# Patient Record
Sex: Female | Born: 1970
Health system: Southern US, Community
[De-identification: ages and names within clinical notes are randomized; demographics above are authoritative.]

## PROBLEM LIST (undated history)

## (undated) DIAGNOSIS — G20 Parkinson's disease: Secondary | ICD-10-CM

## (undated) DIAGNOSIS — R87619 Unspecified abnormal cytological findings in specimens from cervix uteri: Secondary | ICD-10-CM

## (undated) HISTORY — DX: Unspecified abnormal cytological findings in specimens from cervix uteri: R87.619

## (undated) HISTORY — DX: Parkinson's disease: G20

## (undated) HISTORY — PX: COLPOSCOPY: SHX161

---
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
## 1990-09-19 HISTORY — PX: DILATION AND CURETTAGE OF UTERUS: SHX78

## 2002-09-19 HISTORY — PX: AUGMENTATION MAMMAPLASTY: SUR837

## 2013-11-28 ENCOUNTER — Encounter: Payer: Self-pay | Admitting: Certified Nurse Midwife

## 2014-03-27 ENCOUNTER — Encounter: Payer: Self-pay | Admitting: Certified Nurse Midwife

## 2014-03-27 ENCOUNTER — Ambulatory Visit (INDEPENDENT_AMBULATORY_CARE_PROVIDER_SITE_OTHER): Payer: Federal, State, Local not specified - PPO | Admitting: Certified Nurse Midwife

## 2014-03-27 VITALS — BP 136/84 | HR 68 | Ht 65.5 in | Wt 157.0 lb

## 2014-03-27 DIAGNOSIS — Z01419 Encounter for gynecological examination (general) (routine) without abnormal findings: Secondary | ICD-10-CM

## 2014-03-27 DIAGNOSIS — R6889 Other general symptoms and signs: Secondary | ICD-10-CM

## 2014-03-27 DIAGNOSIS — Z0000 Encounter for general adult medical examination without abnormal findings: Secondary | ICD-10-CM

## 2014-03-27 DIAGNOSIS — Z124 Encounter for screening for malignant neoplasm of cervix: Secondary | ICD-10-CM

## 2014-03-27 LAB — HEMOGLOBIN, FINGERSTICK: HEMOGLOBIN, FINGERSTICK: 13.9 g/dL (ref 12.0–16.0)

## 2014-03-27 LAB — POCT URINALYSIS DIPSTICK
Bilirubin, UA: NEGATIVE
GLUCOSE UA: NEGATIVE
Ketones, UA: NEGATIVE
Leukocytes, UA: NEGATIVE
Nitrite, UA: NEGATIVE
Protein, UA: NEGATIVE
RBC UA: NEGATIVE
Urobilinogen, UA: NEGATIVE
pH, UA: 7

## 2014-03-27 NOTE — Progress Notes (Signed)
Patient ID: Sarah Welch, female   DOB: [birth year], 43 y.o.   MRN: [id number] 43 y.o. G62P1011 Married Caucasian Fe here to establish gyn care and  for annual exam. Periods changing over the past year with becoming lighter and missed occasional one. Patient complaining of hot flashes and night sweats that are really a problem and would like t have some help with them.  Patient has not had a pap smear in 20 years. History of abnormal pap with cryo, ? Etiology. No gyn exam since last pap smear. Patient sees urgent care if needed. 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 History of anxiety with Celexa use, working well. Physician who managed has retired, but has current refills. No other health issues today.  Patient's last menstrual period was 03/04/2014.          Sexually active: Yes.    The current method of family planning is none. Spouse vasectomy.    Exercising: No.  The patient does not participate in regular exercise at present. Smoker:  yes  Health Maintenance: Pap:  20 years ago MMG:  never TDaP:  UTD, 2009 Labs: HB:  13.9  Urine:  Negative  Self Breast Exam:  never   reports that she has been smoking Cigarettes.  She has been smoking about 0.50 packs per day. She has never used smokeless tobacco. She reports that she does not drink alcohol or use illicit drugs.  Past Medical History  Diagnosis Date  . Abnormal Pap smear of cervix 1990     unsure abnormality, colpo OK    Past Surgical History  Procedure Laterality Date  . Augmentation mammaplasty Bilateral 2004    implants, saline  . Dilation and curettage of uterus  1992    following SAB    Current Outpatient Prescriptions  Medication Sig Dispense Refill  . citalopram (CELEXA) 20 MG tablet Take 1 tablet by mouth daily.      . furosemide (LASIX) 40 MG tablet Take 40 mg by mouth daily as needed.       No current facility-administered medications for this visit.    Family History  Problem Relation Age of Onset  . Heart  disease Mother   . Diabetes Maternal Grandmother   . Hypertension Maternal Grandmother   . Diabetes Maternal Grandfather   . Hypertension Maternal Grandfather   . Stroke Paternal Grandmother   . Heart attack Paternal Grandfather     ROS:  Pertinent items are noted in HPI.  Otherwise, a comprehensive ROS was negative.  Exam:   BP 136/84  Pulse 68  Ht 5' 5.5" (1.664 m)  Wt 157 lb (71.215 kg)  BMI 25.72 kg/m2  LMP 03/04/2014 Height: 5' 5.5" (166.4 cm)  Ht Readings from Last 3 Encounters:  03/27/14 5' 5.5" (1.664 m)    General appearance: alert, cooperative and appears stated age Head: Normocephalic, without obvious abnormality, atraumatic Neck: no adenopathy, supple, symmetrical, trachea midline and thyroid normal to inspection and palpation and non-palpable Lungs: clear to auscultation bilaterally Breasts: normal appearance, no masses or tenderness, No nipple retraction or dimpling, No nipple discharge or bleeding, No axillary or supraclavicular adenopathy Heart: regular rate and rhythm Abdomen: soft, non-tender; no masses,  no organomegaly Extremities: extremities normal, atraumatic, no cyanosis or edema Skin: Skin color, texture, turgor normal. No rashes or lesions Lymph nodes: Cervical, supraclavicular, and axillary nodes normal. No abnormal inguinal nodes palpated Neurologic: Grossly normal   Pelvic: External genitalia:  no lesions  Urethra:  normal appearing urethra with no masses, tenderness or lesions              Bartholin's and Skene's: normal                 Vagina: normal appearing vagina with normal color and discharge, no lesions              Cervix: normal, non tender              Pap taken: Yes.   Bimanual Exam:  Uterus:  normal size, contour, position, consistency, mobility, non-tender and anteverted              Adnexa: normal adnexa and no mass, fullness, tenderness               Rectovaginal: Confirms               Anus:  normal sphincter  tone, no lesions  A:  Well Woman with normal exam  Perimenopausal with cycle change  History of abnormal pap with cryo 1995  P:   Reviewed health and wellness pertinent to exam  Discussed perimenopausal etiology, expectations, bleeding profile changes. Patient keeping menses record and will continue and advise if no period in 3 months.  Lab:TSH,FSH, Vit D  Discussed importance of aex and pap smear evaluation.  Discussed will look at options to manage hot flashes after lab in. Patient will need to mammogram before hormonal therapy will be discussed. Patient given information to schedule. Questions addressed at length.  Pap smear taken today with HPVHR   counseled on breast self exam, mammography screening, adequate intake of calcium and vitamin D, diet and exercise  return annually or prn  An After Visit Summary was printed and given to the patient.

## 2014-03-27 NOTE — Patient Instructions (Signed)

## 2014-03-28 LAB — TSH: TSH: 1.181 u[IU]/mL (ref 0.350–4.500)

## 2014-03-28 LAB — FOLLICLE STIMULATING HORMONE: FSH: 3.3 m[IU]/mL

## 2014-03-28 LAB — VITAMIN D 25 HYDROXY (VIT D DEFICIENCY, FRACTURES): Vit D, 25-Hydroxy: 32 ng/mL (ref 30–89)

## 2014-03-28 NOTE — Progress Notes (Signed)
Reviewed personally.  M. Suzanne Carol Loftin, MD.  

## 2014-03-31 ENCOUNTER — Telehealth: Payer: Self-pay

## 2014-03-31 LAB — HEMOGLOBIN A1C
HEMOGLOBIN A1C: 5.1 % (ref <5.7)
Mean Plasma Glucose: 100 mg/dL (ref <117)

## 2014-03-31 LAB — IPS PAP TEST WITH HPV

## 2014-03-31 NOTE — Telephone Encounter (Signed)
Message copied by Susy Manor on Mon Mar 31, 2014  4:23 PM ------      Message from: Regina Eck      Created: Mon Mar 31, 2014  8:23 AM       Reviewed pap smear negative, HPVHR not detected 02 ------

## 2014-03-31 NOTE — Telephone Encounter (Signed)
lmtcb

## 2014-03-31 NOTE — Addendum Note (Signed)
Addended by: Regina Eck on: 03/31/2014 08:32 AM   Modules accepted: Orders

## 2014-03-31 NOTE — Addendum Note (Signed)
Addended by: Regina Eck on: 03/31/2014 08:29 AM   Modules accepted: Orders

## 2014-04-01 NOTE — Telephone Encounter (Signed)
Patient notified of results. See lab 

## 2014-04-03 ENCOUNTER — Telehealth: Payer: Self-pay

## 2014-04-03 NOTE — Telephone Encounter (Signed)
lmtcb

## 2014-04-03 NOTE — Telephone Encounter (Signed)
Message copied by Susy Manor on Thu Apr 03, 2014  2:15 PM ------      Message from: Regina Eck      Created: Thu Apr 03, 2014  8:03 AM       Hgb A 1-C normal, please notify patient and have make appointment in 2 weeks for OV to discuss status and hot flash management ------

## 2014-04-07 NOTE — Telephone Encounter (Signed)
Left message for call back.

## 2014-04-09 NOTE — Telephone Encounter (Signed)
Patient notified of results. See lab 

## 2014-04-22 ENCOUNTER — Ambulatory Visit: Payer: Federal, State, Local not specified - PPO | Admitting: Certified Nurse Midwife

## 2014-04-23 ENCOUNTER — Ambulatory Visit: Payer: Federal, State, Local not specified - PPO | Admitting: Certified Nurse Midwife

## 2014-04-24 ENCOUNTER — Ambulatory Visit (INDEPENDENT_AMBULATORY_CARE_PROVIDER_SITE_OTHER): Payer: Federal, State, Local not specified - PPO | Admitting: Certified Nurse Midwife

## 2014-04-24 ENCOUNTER — Encounter: Payer: Self-pay | Admitting: Certified Nurse Midwife

## 2014-04-24 VITALS — BP 128/78 | HR 72 | Ht 65.5 in | Wt 159.0 lb

## 2014-04-24 DIAGNOSIS — N938 Other specified abnormal uterine and vaginal bleeding: Secondary | ICD-10-CM

## 2014-04-24 DIAGNOSIS — N949 Unspecified condition associated with female genital organs and menstrual cycle: Secondary | ICD-10-CM

## 2014-04-24 DIAGNOSIS — N951 Menopausal and female climacteric states: Secondary | ICD-10-CM

## 2014-04-24 NOTE — Progress Notes (Signed)
43 y.o. Married Caucasian G2P1011here for review of labs and discussion of management of periodic hot flashes. Recent new gyn exam on 03/27/14, no exam or pap smear in 20 years. Patient continues to have hot flashes on and off with occasional sleep issues with. Patient now on 2 weeks of bleeding with period, this occurred in 10/14 and 1/15. Patient having no cramping and describes bleeding like a period getting lighter each day, but moderate/light for first 10 days and then a few heavy days. Not soaking more than a pad an hour. "I feel fine" no concerns of dizziness or faintness. Hgb on visit 13.9  O: Healthy female, WD WN Affect: normal orientation X 3 Skin: warm and dry Abdomen: non tender Pelvic exam: External exam: normal no lesions BUS negative Vagina: small amount of dark blood noted in vagina Cervix: small amount of dark blood noted from cervix, non tender, no lesions Uterus: mid position, slightly firm, non tender Adnexa: no masses palpated, non tender, normal    A: DUB with history of irregular menses with prolonged cycle currently ? Perimenopausal changes with hot flashes Normal lab profile on recent aex, FSH not menopausal, pap smear normal(history of cryo for abnormal 20 years ago) Here for discussion of cycle management and hot flashes and lab review. Normal pelvic exam  P: Discussed lab results and no indication of other reasons for hot flashes. Reviewed pap smear results. Discussed normal pelvic exam today . Discussed that fibroids or polyps can also contribute to heavy cycles and feel PUS and possible endometrial biopsy needed to further evaluation. Patient agreeable to what ever needed. Also discussed cycle management with POP which may control irregular cycles and lessen prolonged cycles when irregular. Discussed adding progesterone, may also help with hot flashes. Patient is a smoker so OCP contraindicated. Warning signs of excessive bleeding given. Instructed to start 800 mg  Motrin every 8 hours for next 48 hours which may encourage bleeding to decrease and stop.Discussed insurance personnel will advise precert information and then patient will be scheduled. Rv as above

## 2014-04-28 ENCOUNTER — Other Ambulatory Visit: Payer: Self-pay | Admitting: Certified Nurse Midwife

## 2014-04-28 ENCOUNTER — Telehealth: Payer: Self-pay

## 2014-04-28 ENCOUNTER — Other Ambulatory Visit: Payer: Self-pay | Admitting: Obstetrics & Gynecology

## 2014-04-28 DIAGNOSIS — N951 Menopausal and female climacteric states: Secondary | ICD-10-CM

## 2014-04-28 DIAGNOSIS — N921 Excessive and frequent menstruation with irregular cycle: Secondary | ICD-10-CM

## 2014-04-28 NOTE — Progress Notes (Signed)
I would repeat [place] on day 3 of cycle.  This will give better estimation of ovarian reserve/perimenopausal status.  I removed your orders and placed new orders for [facility] and endometrial biopsy.  Reviewed personally.  Felipa Emory, MD.

## 2014-04-28 NOTE — Telephone Encounter (Signed)
   Patient has already been notified and had consult regarding hot flashes. She needs [place] drawn on day 3 of cycle please schedule, order in, and she will be called regarding scheduling of SHGM and Endo biopsy.     Pt notified of this today. Pt states she will call when her cycle starts so she can have labs drawn on day 3 of her cycle

## 2014-04-30 ENCOUNTER — Telehealth: Payer: Self-pay | Admitting: Obstetrics & Gynecology

## 2014-04-30 NOTE — Telephone Encounter (Signed)
Left message for patient to call back.  SHGM only $230 EMB only $150

## 2014-04-30 NOTE — Telephone Encounter (Signed)
Message copied by Laqueta Due on Wed Apr 30, 2014 10:06 AM ------      Message from: Megan Salon      Created: Mon Apr 28, 2014 11:17 AM      Regarding: RE: orders on pt       I think she needs the [facility] but maybe not the biopsy.  Can you tell her total out of pocket but put a note not to collect anything until procedure is over and I know what I've done.  She should be scheduled for [facility], though.  Thanks.            MSM      ----- Message -----         From: Laqueta Due         Sent: 04/28/2014   9:26 AM           To: Lyman Speller, MD      Subject: RE: orders on pt                                         Good morning!            I completed pre-cert for this patient. If she has SGHM/EMB her out of pocket expense will be $380//if she has PUS only, her out of pocket expense will be $40.             Thank you,      Gabriel Cirri      ----- Message -----         From: Lyman Speller, MD         Sent: 04/28/2014   8:09 AM           To: Laqueta Due      Subject: orders on pt                                             I put in order for shgm and endo biopsy.  Endometrial biopsy really is just a possibility, not for sure.  Also, if this is very expensive I could start with just a PUS.  Please let me know.            MSM             ------

## 2014-04-30 NOTE — Telephone Encounter (Signed)
Spoke with patient. Advised of recommendation for SHGM/EMB. Advised that oop cost for both procedures would be $380. Also advised that Dr Sabra Heck strongly suggests that she proceed with [facility] and that the oop cost for that would be $230. Patient states that she would like to take a moment to review her finances and that she would call back to schedule.

## 2014-05-05 NOTE — Telephone Encounter (Signed)
Spoke with patient regarding scheduling for Sonohysterogram and possible endometrial bx.  She states last cycle last approximately 3-4 weeks.  Patient is sexually active, husband has vasectomy.  How should patient be scheduled for Sonohysterogram with irregular cycle?

## 2014-05-06 NOTE — Telephone Encounter (Signed)
Anytime as it will be hard to schedule right after a cycle.  Also, husband with vasectomy so no pregnancy risk.  Thanks.

## 2014-05-06 NOTE — Telephone Encounter (Signed)
Spoke with patient and message from Dr. Sabra Heck given.  Patient agreeable.  Advised will collect out of pocket after she is seen by Dr. Sabra Heck as we will wait that day until procedures are done.  U/S scheduled and patient aware/agreeable to time.  Patient verbalized understanding of the U/S appointment cancellation policy. Advised will need to cancel within 72 business hours (3 business days) or will have $150.00 no show fee placed to account. $150.00 for [facility] Requested to please check in 15 minutes early for registration and no minor children to appointment. Patient is agreeable and verbalizes understanding.  Ultrasound instructions mailed to patient.   Routing to provider for final review. Patient agreeable to disposition. Will close encounter

## 2014-05-08 ENCOUNTER — Other Ambulatory Visit (INDEPENDENT_AMBULATORY_CARE_PROVIDER_SITE_OTHER): Payer: Federal, State, Local not specified - PPO

## 2014-05-08 DIAGNOSIS — R6889 Other general symptoms and signs: Secondary | ICD-10-CM

## 2014-05-08 DIAGNOSIS — N951 Menopausal and female climacteric states: Secondary | ICD-10-CM

## 2014-05-09 LAB — FOLLICLE STIMULATING HORMONE: FSH: 15 m[IU]/mL

## 2014-05-29 ENCOUNTER — Ambulatory Visit (INDEPENDENT_AMBULATORY_CARE_PROVIDER_SITE_OTHER): Payer: Federal, State, Local not specified - PPO | Admitting: Obstetrics & Gynecology

## 2014-05-29 ENCOUNTER — Ambulatory Visit (INDEPENDENT_AMBULATORY_CARE_PROVIDER_SITE_OTHER): Payer: Federal, State, Local not specified - PPO

## 2014-05-29 ENCOUNTER — Other Ambulatory Visit: Payer: Self-pay | Admitting: Obstetrics & Gynecology

## 2014-05-29 VITALS — BP 128/72 | Ht 65.5 in | Wt 159.0 lb

## 2014-05-29 DIAGNOSIS — N938 Other specified abnormal uterine and vaginal bleeding: Secondary | ICD-10-CM

## 2014-05-29 DIAGNOSIS — N925 Other specified irregular menstruation: Secondary | ICD-10-CM

## 2014-05-29 DIAGNOSIS — N949 Unspecified condition associated with female genital organs and menstrual cycle: Secondary | ICD-10-CM

## 2014-05-29 DIAGNOSIS — N921 Excessive and frequent menstruation with irregular cycle: Secondary | ICD-10-CM

## 2014-05-29 DIAGNOSIS — N920 Excessive and frequent menstruation with regular cycle: Secondary | ICD-10-CM

## 2014-05-29 MED ORDER — NORETHINDRONE 0.35 MG PO TABS: 1.0000 | ORAL_TABLET | Freq: Every day | ORAL | Status: DC

## 2014-05-29 NOTE — Progress Notes (Signed)
43 y.o.Marriedfemale here for a pelvic ultrasound with sonohystogram due to DUB.  Pt initially seen by D. Hollice Espy 03/27/14 as new patient.  Major issue at that time was hot flashes.  FSH was 3.3.  Menstrual calendar kept and pt was seen again in follow up.  Cycles never heavy but could last up to 14 days.  Pt continued with hot flashes.  Day 3 FSH was obtained and was 15.  D/W pt these results today.    Contraception: vasectomy  Technique:  Both transabdominal and transvaginal ultrasound examinations of the pelvis were performed. Transabdominal technique was performed for global imaging of the pelvis including uterus, ovaries, adnexal regions, and pelvic cul-de-sac.  It was necessary to proceed with endovaginal exam following the abdominal ultrasound transabdominal exam to visualize the endometrium and adnexa.  Color and duplex Doppler ultrasound was utilized to evaluate blood flow to the ovaries.   FINDINGS: Uterus: 8.5 x 6.4 x 5.0cm, anteverted with normal shape Endometrium: 66mm Adnexa:  Left: 2.2 x 1.3 x 0.8cm     Right: 2.8 x 1.6 x 1.2cm with 94mm and 53GU follicles Cul de sac: no free fluid  SHSG:  After obtaining appropriate verbal consent from patient, the cervix was visualized using a speculum, and prepped with betadine.  A tenaculum  was applied to the cervix.  Dilation of the cervix was not necessary. The catheter was passed into the uterus and sterile saline introduced, with the following findings: no intracavitary lesions noted.  Endometrial biopsy recommended. Verbal and written consent obtained.  Speculum placed.  Cervix visualized and cleansed with betadine prep.  A single toothed tenaculum was applied to the anterior lip of the cervix.  Endometrial pipelle was advanced through the cervix into the endometrial cavity without difficulty.  Pipelle passed to 7.5cm.  Suction applied and pipelle removed with good tissue sample obtained.  Tenculum removed.  No bleeding noted.  Patient tolerated  procedure well.  All instruments removed.  Assessment: Perimenopausal DUB Smoker Day 3 FSH 15  Plan:  Endometrial biopsy pending.  If normal, may consider micronor or low dose HRT with cyclic progesterone to improve bleeding profile.  ~25 minutes spent with patient >50% of time was in face to face discussion of above.

## 2014-06-08 ENCOUNTER — Encounter: Payer: Self-pay | Admitting: Obstetrics & Gynecology

## 2014-06-08 DIAGNOSIS — N938 Other specified abnormal uterine and vaginal bleeding: Secondary | ICD-10-CM | POA: Insufficient documentation

## 2014-06-11 ENCOUNTER — Telehealth: Payer: Self-pay | Admitting: Emergency Medicine

## 2014-06-11 NOTE — Telephone Encounter (Signed)
Message copied by Michele Mcalpine on Wed Jun 11, 2014 10:03 AM ------      Message from: Megan Salon      Created: Sun Jun 08, 2014 11:53 PM       Inform biopsy is negative for abnormal cells.  Done for DUB in 43 yo woman with mildly elevated FSH--perimenopausal.  I think she has a couple of options right now--start low dose HRT with cyclic progesterone to help with hot flashes and to, hopefully, get cycles more regular OR start micronor for cycle regulation.  It will help some with hot flashes but may not be enough.  Will have to try. ------

## 2014-06-11 NOTE — Telephone Encounter (Signed)
Advised patient of results from Dr. Sabra Heck.  Patient has been on Micronor x 2.5 weeks. She is still having some hot flashes. Patient discussed with patient her choices, patient thinks she will try with progesterone only pills for a few more weeks and see how she feels and will return call if she would like to try hormone replacement therapy.   Routing to provider for final review. Patient agreeable to disposition. Will close encounter

## 2014-07-01 DIAGNOSIS — R251 Tremor, unspecified: Secondary | ICD-10-CM | POA: Insufficient documentation

## 2014-07-01 DIAGNOSIS — R259 Unspecified abnormal involuntary movements: Secondary | ICD-10-CM | POA: Insufficient documentation

## 2014-07-01 DIAGNOSIS — G8190 Hemiplegia, unspecified affecting unspecified side: Secondary | ICD-10-CM | POA: Insufficient documentation

## 2014-07-02 ENCOUNTER — Telehealth: Payer: Self-pay | Admitting: Obstetrics & Gynecology

## 2014-07-02 NOTE — Telephone Encounter (Signed)
Pt had lab appt scheduled for 10/16 but if not going to be able to make it so she canceled and said she would call back to reschedule. i dont see orders for the labs.

## 2014-07-02 NOTE — Telephone Encounter (Signed)
That is fine.  Thanks.  Encounter closed.

## 2014-07-02 NOTE — Telephone Encounter (Signed)
Patient had 3 month recheck for Vitamin D level scheduled for 10/16. Currently on Vitamin D3 1000 units daily. Patient cancelled and states that she will call to reschedule. Patient also has a 3 month recheck on 12/10 with Dr.Miller from ultrasound visit on 9/10 for DUB.   Routing to [place] as Juluis Rainier

## 2014-07-04 ENCOUNTER — Other Ambulatory Visit: Payer: Federal, State, Local not specified - PPO

## 2014-07-21 ENCOUNTER — Encounter: Payer: Self-pay | Admitting: Obstetrics & Gynecology

## 2014-07-29 DIAGNOSIS — M5412 Radiculopathy, cervical region: Secondary | ICD-10-CM | POA: Insufficient documentation

## 2014-07-29 DIAGNOSIS — R252 Cramp and spasm: Secondary | ICD-10-CM | POA: Insufficient documentation

## 2014-08-19 DIAGNOSIS — G20 Parkinson's disease: Secondary | ICD-10-CM

## 2014-08-19 DIAGNOSIS — G20A1 Parkinson's disease without dyskinesia, without mention of fluctuations: Secondary | ICD-10-CM | POA: Insufficient documentation

## 2014-08-19 HISTORY — DX: Parkinson's disease without dyskinesia, without mention of fluctuations: G20.A1

## 2014-08-19 HISTORY — DX: Parkinson's disease: G20

## 2014-08-28 ENCOUNTER — Ambulatory Visit (INDEPENDENT_AMBULATORY_CARE_PROVIDER_SITE_OTHER): Payer: Federal, State, Local not specified - PPO | Admitting: Obstetrics & Gynecology

## 2014-08-28 ENCOUNTER — Encounter: Payer: Self-pay | Admitting: Obstetrics & Gynecology

## 2014-08-28 VITALS — BP 126/70 | HR 68 | Resp 16 | Wt 164.0 lb

## 2014-08-28 DIAGNOSIS — E559 Vitamin D deficiency, unspecified: Secondary | ICD-10-CM

## 2014-08-28 DIAGNOSIS — N938 Other specified abnormal uterine and vaginal bleeding: Secondary | ICD-10-CM

## 2014-08-28 MED ORDER — NORETHINDRONE 0.35 MG PO TABS: 1.0000 | ORAL_TABLET | Freq: Every day | ORAL | Status: DC

## 2014-08-28 NOTE — Progress Notes (Signed)
43 y.o. Married Caucasian female G2P1011 here for follow up of DUB after starting Micronor.  Pt had normal ultrasound and with endometrial biopsy 9/15.  Hot flashes have improved and she has had no bleeding since beginning the micronor.  Really very please with how she is doing.  Expected cycles to get better but not to stop entirely.  Advised this is a side effect with micronor so can switch if this bother her.  It does not so will not make a change.     Does need Vit D recheck today.  Was hoping could do at same time.    Big event since last visit is she was diagnosed with Parkinson's.  Seeing local neurologist but does have referral to neurologist at [facility] that is upcoming.  Reports she had suspected it for a while as she was having trouble doing anything with one hand due to tremor.  That stopped within 24 hours of starting Levodopa.  Wasn't really that upset with diagnosis as there could have been many other worse findings.  Happy to be able to continue working full time.  O: Healthy WD,WN female Affect: normal No other exam today  A:  DUB, improved with micronor Vit D deficiency New diagnosis of Parkinson's  Plan:  Continue Micronor.  Rx to pharmacy. Vit D today. Return for AEX 2016.

## 2014-08-29 LAB — VITAMIN D 25 HYDROXY (VIT D DEFICIENCY, FRACTURES): VIT D 25 HYDROXY: 21 ng/mL — AB (ref 30–100)

## 2014-09-09 ENCOUNTER — Telehealth: Payer: Self-pay

## 2014-09-09 NOTE — Telephone Encounter (Signed)
Lmtcb//kn 

## 2014-09-09 NOTE — Telephone Encounter (Signed)
Patient is returning a call to Kelly °

## 2014-09-09 NOTE — Telephone Encounter (Signed)
-----   Message from Lyman Speller, MD sent at 09/08/2014  5:01 PM EST ----- Please inform Vit d is still low.  Is she taking the supplement?  If so, needs 50,000 IU weekly and repeat VIt D in 3 months.  If not, needs to start and can check again at AEX.  Thanks.

## 2014-09-09 NOTE — Telephone Encounter (Signed)
lmtcb to discuss Vitamin D results.//kn

## 2014-09-10 NOTE — Telephone Encounter (Signed)
Pt informed of results. Pt states she has not been taking any supplements and stated she will start them. Pt understands we will recheck at her AEX  Encounter closed

## 2014-09-25 ENCOUNTER — Ambulatory Visit (INDEPENDENT_AMBULATORY_CARE_PROVIDER_SITE_OTHER): Payer: Federal, State, Local not specified - PPO | Admitting: Nurse Practitioner

## 2014-09-25 ENCOUNTER — Encounter: Payer: Self-pay | Admitting: Nurse Practitioner

## 2014-09-25 VITALS — BP 110/70 | HR 72 | Resp 16 | Ht 65.5 in | Wt 161.0 lb

## 2014-09-25 DIAGNOSIS — N390 Urinary tract infection, site not specified: Secondary | ICD-10-CM

## 2014-09-25 DIAGNOSIS — N760 Acute vaginitis: Secondary | ICD-10-CM

## 2014-09-25 DIAGNOSIS — R319 Hematuria, unspecified: Secondary | ICD-10-CM

## 2014-09-25 LAB — POCT URINALYSIS DIPSTICK
Bilirubin, UA: NEGATIVE
Blood, UA: 50
GLUCOSE UA: NEGATIVE
Ketones, UA: NEGATIVE
Nitrite, UA: NEGATIVE
Protein, UA: NEGATIVE
UROBILINOGEN UA: NEGATIVE
pH, UA: 5

## 2014-09-25 NOTE — Progress Notes (Signed)
44 y.o.  MW Fe [id number] here with complaint of vaginal symptoms of itching, burning, and increase discharge. Describes discharge as clear. Onset of symptoms 5 days ago. Denies new personal products or vaginal dryness. No STD concerns. Urinary symptoms 12/27 with urgency and frequency treated with increase in fluids and symptoms improved. No fever or chills.  On continuous active pills to control AUB.   O:Healthy female WDWN Affect: normal, orientation x 3  Exam: Abdomen: Lymph node: no enlargement or tenderness Pelvic exam: External genital:  BUS: negative Vagina: thin light brown tinged discharge noted. Cervix: normal, non tender Uterus: normal, non tender Adnexa:normal, non tender, no masses or fullness noted  Affirm test is done   A: R/O BV  continuous active OCP for AUB with BTB  R/O UTI   P: Discussed findings of vaginitis and etiology.  She may have vaginal PH changes due to onset of menses . Discussed Aveeno or baking soda sitz bath for comfort. Avoid moist clothes or pads for extended period of time. If working out in gym clothes or swim suits for long periods of time change underwear or bottoms of swimsuit if possible. Olive Oil use for skin protection prior to activity can be used to external skin.  Rx: none at present awaiting labs - Affirm  Since not symptomatic with UTI - also await treatment for that.  RV prn

## 2014-09-25 NOTE — Patient Instructions (Signed)
Bacterial Vaginosis Bacterial vaginosis is a vaginal infection that occurs when the normal balance of bacteria in the vagina is disrupted. It results from an overgrowth of certain bacteria. This is the most common vaginal infection in women of childbearing age. Treatment is important to prevent complications, especially in pregnant women, as it can cause a premature delivery. CAUSES  Bacterial vaginosis is caused by an increase in harmful bacteria that are normally present in smaller amounts in the vagina. Several different kinds of bacteria can cause bacterial vaginosis. However, the reason that the condition develops is not fully understood. RISK FACTORS Certain activities or behaviors can put you at an increased risk of developing bacterial vaginosis, including:  Having a new sex partner or multiple sex partners.  Douching.  Using an intrauterine device (IUD) for contraception. Women do not get bacterial vaginosis from toilet seats, bedding, swimming pools, or contact with objects around them. SIGNS AND SYMPTOMS  Some women with bacterial vaginosis have no signs or symptoms. Common symptoms include:  Grey vaginal discharge.  A fishlike odor with discharge, especially after sexual intercourse.  Itching or burning of the vagina and vulva.  Burning or pain with urination. DIAGNOSIS  Your health care provider will take a medical history and examine the vagina for signs of bacterial vaginosis. A sample of vaginal fluid may be taken. Your health care provider will look at this sample under a microscope to check for bacteria and abnormal cells. A vaginal pH test may also be done.  TREATMENT  Bacterial vaginosis may be treated with antibiotic medicines. These may be given in the form of a pill or a vaginal cream. A second round of antibiotics may be prescribed if the condition comes back after treatment.  HOME CARE INSTRUCTIONS   Only take over-the-counter or prescription medicines as  directed by your health care provider.  If antibiotic medicine was prescribed, take it as directed. Make sure you finish it even if you start to feel better.  Do not have sex until treatment is completed.  Tell all sexual partners that you have a vaginal infection. They should see their health care provider and be treated if they have problems, such as a mild rash or itching.  Practice safe sex by using condoms and only having one sex partner. SEEK MEDICAL CARE IF:   Your symptoms are not improving after 3 days of treatment.  You have increased discharge or pain.  You have a fever. MAKE SURE YOU:   Understand these instructions.  Will watch your condition.  Will get help right away if you are not doing well or get worse. FOR MORE INFORMATION  Centers for Disease Control and Prevention, Division of STD Prevention: www.cdc.gov/std American Sexual Health Association (ASHA): www.ashastd.org  Document Released: 09/05/2005 Document Revised: 06/26/2013 Document Reviewed: 04/17/2013 ExitCare Patient Information 2015 [employer]. This information is not intended to replace advice given to you by your health care provider. Make sure you discuss any questions you have with your health care provider.  

## 2014-09-26 LAB — URINALYSIS, MICROSCOPIC ONLY
Casts: NONE SEEN
Crystals: NONE SEEN

## 2014-09-26 LAB — WET PREP BY MOLECULAR PROBE
Candida species: NEGATIVE
GARDNERELLA VAGINALIS: NEGATIVE
Trichomonas vaginosis: NEGATIVE

## 2014-09-27 LAB — URINE CULTURE: Colony Count: 3000

## 2014-09-28 NOTE — Progress Notes (Signed)
Encounter reviewed by Dr. Ferrel Simington Silva.  

## 2014-12-10 DIAGNOSIS — F419 Anxiety disorder, unspecified: Secondary | ICD-10-CM | POA: Insufficient documentation

## 2014-12-23 DIAGNOSIS — G2581 Restless legs syndrome: Secondary | ICD-10-CM | POA: Insufficient documentation

## 2015-04-02 ENCOUNTER — Encounter: Payer: Self-pay | Admitting: Certified Nurse Midwife

## 2015-04-02 ENCOUNTER — Ambulatory Visit (INDEPENDENT_AMBULATORY_CARE_PROVIDER_SITE_OTHER): Payer: Federal, State, Local not specified - PPO | Admitting: Certified Nurse Midwife

## 2015-04-02 VITALS — BP 110/74 | HR 70 | Resp 16 | Ht 64.75 in | Wt 159.0 lb

## 2015-04-02 DIAGNOSIS — Z01419 Encounter for gynecological examination (general) (routine) without abnormal findings: Secondary | ICD-10-CM

## 2015-04-02 DIAGNOSIS — Z124 Encounter for screening for malignant neoplasm of cervix: Secondary | ICD-10-CM | POA: Diagnosis not present

## 2015-04-02 DIAGNOSIS — Z0000 Encounter for general adult medical examination without abnormal findings: Secondary | ICD-10-CM

## 2015-04-02 LAB — LIPID PANEL
CHOL/HDL RATIO: 2.5 ratio
CHOLESTEROL: 134 mg/dL (ref 0–200)
HDL: 54 mg/dL (ref >=46)
LDL Cholesterol: 69 mg/dL (ref 0–99)
Triglycerides: 53 mg/dL (ref <150)
VLDL: 11 mg/dL (ref 0–40)

## 2015-04-02 LAB — CBC
HCT: 40.6 % (ref 36.0–46.0)
Hemoglobin: 13 g/dL (ref 12.0–15.0)
MCH: 27.8 pg (ref 26.0–34.0)
MCHC: 32 g/dL (ref 30.0–36.0)
MCV: 86.9 fL (ref 78.0–100.0)
MPV: 10.2 fL (ref 8.6–12.4)
PLATELETS: 219 10*3/uL (ref 150–400)
RBC: 4.67 MIL/uL (ref 3.87–5.11)
RDW: 14.6 % (ref 11.5–15.5)
WBC: 7.8 10*3/uL (ref 4.0–10.5)

## 2015-04-02 LAB — COMPREHENSIVE METABOLIC PANEL
ALK PHOS: 35 U/L — AB (ref 39–117)
ALT: 12 U/L (ref 0–35)
AST: 17 U/L (ref 0–37)
Albumin: 3.9 g/dL (ref 3.5–5.2)
BILIRUBIN TOTAL: 0.5 mg/dL (ref 0.2–1.2)
BUN: 11 mg/dL (ref 6–23)
CALCIUM: 9.2 mg/dL (ref 8.4–10.5)
CHLORIDE: 103 meq/L (ref 96–112)
CO2: 23 mEq/L (ref 19–32)
CREATININE: 0.77 mg/dL (ref 0.50–1.10)
Glucose, Bld: 91 mg/dL (ref 70–99)
Potassium: 4.8 mEq/L (ref 3.5–5.3)
SODIUM: 137 meq/L (ref 135–145)
Total Protein: 6.7 g/dL (ref 6.0–8.3)

## 2015-04-02 MED ORDER — NORETHINDRONE 0.35 MG PO TABS: 1.0000 | ORAL_TABLET | Freq: Every day | ORAL | Status: DC

## 2015-04-02 NOTE — Patient Instructions (Signed)
EXERCISE AND DIET:  We recommended that you start or continue a regular exercise program for good health. Regular exercise means any activity that makes your heart beat faster and makes you sweat.  We recommend exercising at least 30 minutes per day at least 3 days a week, preferably 4 or 5.  We also recommend a diet low in fat and sugar.  Inactivity, poor dietary choices and obesity can cause diabetes, heart attack, stroke, and kidney damage, among others.    ALCOHOL AND SMOKING:  Women should limit their alcohol intake to no more than 7 drinks/beers/glasses of wine (combined, not each!) per week. Moderation of alcohol intake to this level decreases your risk of breast cancer and liver damage. And of course, no recreational drugs are part of a healthy lifestyle.  And absolutely no smoking or even second hand smoke. Most people know smoking can cause heart and lung diseases, but did you know it also contributes to weakening of your bones? Aging of your skin?  Yellowing of your teeth and nails?  CALCIUM AND VITAMIN D:  Adequate intake of calcium and Vitamin D are recommended.  The recommendations for exact amounts of these supplements seem to change often, but generally speaking 600 mg of calcium (either carbonate or citrate) and 800 units of Vitamin D per day seems prudent. Certain women may benefit from higher intake of Vitamin D.  If you are among these women, your doctor will have told you during your visit.    PAP SMEARS:  Pap smears, to check for cervical cancer or precancers,  have traditionally been done yearly, although recent scientific advances have shown that most women can have pap smears less often.  However, every woman still should have a physical exam from her gynecologist every year. It will include a breast check, inspection of the vulva and vagina to check for abnormal growths or skin changes, a visual exam of the cervix, and then an exam to evaluate the size and shape of the uterus and  ovaries.  And after 45 years of age, a rectal exam is indicated to check for rectal cancers. We will also provide age appropriate advice regarding health maintenance, like when you should have certain vaccines, screening for sexually transmitted diseases, bone density testing, colonoscopy, mammograms, etc.   MAMMOGRAMS:  All women over 40 years old should have a yearly mammogram. Many facilities now offer a "3D" mammogram, which may cost around $50 extra out of pocket. If possible,  we recommend you accept the option to have the 3D mammogram performed.  It both reduces the number of women who will be called back for extra views which then turn out to be normal, and it is better than the routine mammogram at detecting truly abnormal areas.    COLONOSCOPY:  Colonoscopy to screen for colon cancer is recommended for all women at age 50.  We know, you hate the idea of the prep.  We agree, BUT, having colon cancer and not knowing it is worse!!  Colon cancer so often starts as a polyp that can be seen and removed at colonscopy, which can quite literally save your life!  And if your first colonoscopy is normal and you have no family history of colon cancer, most women don't have to have it again for 10 years.  Once every ten years, you can do something that may end up saving your life, right?  We will be happy to help you get it scheduled when you are ready.    Be sure to check your insurance coverage so you understand how much it will cost.  It may be covered as a preventative service at no cost, but you should check your particular policy.     Exercise to Lose Weight Exercise and a healthy diet may help you lose weight. Your doctor may suggest specific exercises. EXERCISE IDEAS AND TIPS  Choose low-cost things you enjoy doing, such as walking, bicycling, or exercising to workout videos.  Take stairs instead of the elevator.  Walk during your lunch break.  Park your car further away from work or  school.  Go to a gym or an exercise class.  Start with 5 to 10 minutes of exercise each day. Build up to 30 minutes of exercise 4 to 6 days a week.  Wear shoes with good support and comfortable clothes.  Stretch before and after working out.  Work out until you breathe harder and your heart beats faster.  Drink extra water when you exercise.  Do not do so much that you hurt yourself, feel dizzy, or get very short of breath. Exercises that burn about 150 calories:  Running 1  miles in 15 minutes.  Playing volleyball for 45 to 60 minutes.  Washing and waxing a car for 45 to 60 minutes.  Playing touch football for 45 minutes.  Walking 1  miles in 35 minutes.  Pushing a stroller 1  miles in 30 minutes.  Playing basketball for 30 minutes.  Raking leaves for 30 minutes.  Bicycling 5 miles in 30 minutes.  Walking 2 miles in 30 minutes.  Dancing for 30 minutes.  Shoveling snow for 15 minutes.  Swimming laps for 20 minutes.  Walking up stairs for 15 minutes.  Bicycling 4 miles in 15 minutes.  Gardening for 30 to 45 minutes.  Jumping rope for 15 minutes.  Washing windows or floors for 45 to 60 minutes. Document Released: 10/08/2010 Document Revised: 11/28/2011 Document Reviewed: 10/08/2010 [facility] Patient Information 2015 [place], [place]. This information is not intended to replace advice given to you by your health care provider. Make sure you discuss any questions you have with your health care provider.

## 2015-04-02 NOTE — Progress Notes (Signed)
44 y.o. G28P1011 Married  Caucasian Fe here for annual exam. Menses absent with Micronor, but now regular, not heavy. Happy with choice for contraception and cycle control. Sees PCP for Celexa management, stable dosage.  Sees Dr.Willis for other medication management for Parkinson Disease, stable at present. Desires screening labs today. Plans to work on [employer] management with better choices and exercise. No other health issues today.  Patient's last menstrual period was 03/02/2015.          Sexually active: Yes.    The current method of family planning is oral progesterone-only contraceptive.    Exercising: Yes.    walking Smoker:  no  Health Maintenance: Pap: 03-27-14 neg HPV HR neg MMG: 04-02-14 category b density, birads 2:neg she has scheduled Colonoscopy:  none BMD:   none TDaP:  2009 Labs: Hgb-13.2 Self breast exam: not done   reports that she has quit smoking. Her smoking use included Cigarettes. She smoked 0.50 packs per day. She has never used smokeless tobacco. She reports that she does not drink alcohol or use illicit drugs.  Past Medical History  Diagnosis Date  . Abnormal Pap smear of cervix 1990     unsure abnormality, colpo OK  . Parkinson disease 12/15    Past Surgical History  Procedure Laterality Date  . Augmentation mammaplasty Bilateral 2004    implants, saline  . Dilation and curettage of uterus  1992    following SAB  . Colposcopy      Current Outpatient Prescriptions  Medication Sig Dispense Refill  . carbidopa-levodopa (SINEMET IR) 25-100 MG per tablet Take by mouth.    . citalopram (CELEXA) 20 MG tablet Take 1 tablet by mouth daily.    . furosemide (LASIX) 40 MG tablet Take 40 mg by mouth daily as needed.    . gabapentin (NEURONTIN) 300 MG capsule Take 300 mg by mouth.    . norethindrone (MICRONOR,CAMILA,ERRIN) 0.35 MG tablet Take 1 tablet (0.35 mg total) by mouth daily. 1 Package 7   No current facility-administered medications for this visit.     Family History  Problem Relation Age of Onset  . Heart disease Mother   . Diabetes Maternal Grandmother   . Hypertension Maternal Grandmother   . Diabetes Maternal Grandfather   . Hypertension Maternal Grandfather   . Stroke Paternal Grandmother   . Heart attack Paternal Grandfather     ROS:  Pertinent items are noted in HPI.  Otherwise, a comprehensive ROS was negative.  Exam:   BP 110/74 mmHg  Pulse 70  Resp 16  Ht 5' 4.75" (1.645 m)  Wt 159 lb (72.122 kg)  BMI 26.65 kg/m2  LMP 03/02/2015 Height: 5' 4.75" (164.5 cm) Ht Readings from Last 3 Encounters:  04/02/15 5' 4.75" (1.645 m)  09/25/14 5' 5.5" (1.664 m)  05/29/14 5' 5.5" (1.664 m)    General appearance: alert, cooperative and appears stated age Head: Normocephalic, without obvious abnormality, atraumatic Neck: no adenopathy, supple, symmetrical, trachea midline and thyroid normal to inspection and palpation Lungs: clear to auscultation bilaterally Breasts: normal appearance, no masses or tenderness, No nipple retraction or dimpling, No nipple discharge or bleeding, No axillary or supraclavicular adenopathy Heart: regular rate and rhythm Abdomen: soft, non-tender; no masses,  no organomegaly Extremities: extremities normal, atraumatic, no cyanosis or edema Skin: Skin color, texture, turgor normal. No rashes or lesions Lymph nodes: Cervical, supraclavicular, and axillary nodes normal. No abnormal inguinal nodes palpated Neurologic: Grossly normal   Pelvic: External genitalia:  no lesions  Urethra:  normal appearing urethra with no masses, tenderness or lesions              Bartholin's and Skene's: normal                 Vagina: normal appearing vagina with normal color and discharge, no lesions              Cervix: normal,non tender,no lesions              Pap taken: Yes.   Bimanual Exam:  Uterus:  normal size, contour, position, consistency, mobility, non-tender              Adnexa: normal adnexa  and no mass, fullness, tenderness               Rectovaginal: Confirms               Anus:  normal sphincter tone, no lesions  Chaperone present: Yes  A:  Well Woman with normal exam  Contraception POP desires continuance  Parkinson Disease with Neurology management  Anxiety with MD management  Screening labs  History of abnormal pap with Cryo  P:   Reviewed health and wellness pertinent to exam  Rx Micronor see order  Continue MD management as indicated  Labs CMP,Lipid panel, Vitamin D, CBC  Pap smear taken with HPV reflex   counseled on breast self exam, mammography screening, adequate intake of calcium and vitamin D, diet and exercise  return annually or prn  An After Visit Summary was printed and given to the patient.

## 2015-04-03 LAB — HEMOGLOBIN, FINGERSTICK: HEMOGLOBIN, FINGERSTICK: 13.2 g/dL (ref 12.0–16.0)

## 2015-04-03 LAB — VITAMIN D 25 HYDROXY (VIT D DEFICIENCY, FRACTURES): Vit D, 25-Hydroxy: 34 ng/mL (ref 30–100)

## 2015-04-03 NOTE — Progress Notes (Signed)
Reviewed personally.  M. Suzanne Glada Wickstrom, MD.  

## 2015-04-04 LAB — IPS PAP TEST WITH REFLEX TO HPV

## 2015-10-02 DIAGNOSIS — J40 Bronchitis, not specified as acute or chronic: Secondary | ICD-10-CM | POA: Insufficient documentation

## 2015-10-02 DIAGNOSIS — J0110 Acute frontal sinusitis, unspecified: Secondary | ICD-10-CM | POA: Insufficient documentation

## 2016-04-07 DIAGNOSIS — Z9109 Other allergy status, other than to drugs and biological substances: Secondary | ICD-10-CM | POA: Insufficient documentation

## 2016-04-07 DIAGNOSIS — R51 Headache: Secondary | ICD-10-CM

## 2016-04-07 DIAGNOSIS — R519 Headache, unspecified: Secondary | ICD-10-CM | POA: Insufficient documentation

## 2016-04-15 ENCOUNTER — Ambulatory Visit (INDEPENDENT_AMBULATORY_CARE_PROVIDER_SITE_OTHER): Payer: Federal, State, Local not specified - PPO | Admitting: Certified Nurse Midwife

## 2016-04-15 ENCOUNTER — Encounter: Payer: Self-pay | Admitting: Certified Nurse Midwife

## 2016-04-15 VITALS — BP 120/78 | HR 72 | Resp 16 | Ht 64.75 in | Wt 159.0 lb

## 2016-04-15 DIAGNOSIS — Z0000 Encounter for general adult medical examination without abnormal findings: Secondary | ICD-10-CM

## 2016-04-15 DIAGNOSIS — Z01419 Encounter for gynecological examination (general) (routine) without abnormal findings: Secondary | ICD-10-CM | POA: Diagnosis not present

## 2016-04-15 DIAGNOSIS — Z3041 Encounter for surveillance of contraceptive pills: Secondary | ICD-10-CM | POA: Diagnosis not present

## 2016-04-15 DIAGNOSIS — K649 Unspecified hemorrhoids: Secondary | ICD-10-CM

## 2016-04-15 LAB — POCT URINALYSIS DIPSTICK
BILIRUBIN UA: NEGATIVE
GLUCOSE UA: NEGATIVE
KETONES UA: NEGATIVE
Leukocytes, UA: NEGATIVE
NITRITE UA: NEGATIVE
PH UA: 5
Protein, UA: NEGATIVE
RBC UA: NEGATIVE
Urobilinogen, UA: NEGATIVE

## 2016-04-15 MED ORDER — NORETHINDRONE 0.35 MG PO TABS: 1.0000 | ORAL_TABLET | Freq: Every day | ORAL | 4 refills | Status: DC

## 2016-04-15 NOTE — Progress Notes (Signed)
45 y.o. G4P1011 Married  Caucasian Fe here for annual exam. Periods are scant to none with Micronor use. Sees PCP for Tramadol(sleep) and allergy medication. Sees Neurology for Parkinson's disease with medication management. Stable at present. Still having tremors at times. Desires lab if needed. ? Hemorrhoid please check.  Denies constipation and blood in stool. No other health issues today.  LMP: 01-14-16        Sexually active: Yes.    The current method of family planning is oral progesterone-only contraceptive.    Exercising: No.  exercise Smoker:  no  Health Maintenance: Pap: 04-02-15 neg MMG: 04-24-15 category b density birads 1:neg Colonoscopy:  none BMD:   none TDaP:  2009 Shingles: no Pneumonia: no Hep C and HIV: HIV done negative. Labs: poct urine-neg Self breast exam: not done   reports that she has quit smoking. Her smoking use included Cigarettes. She smoked 0.50 packs per day. She has never used smokeless tobacco. She reports that she does not drink alcohol or use drugs.  Past Medical History:  Diagnosis Date  . Abnormal Pap smear of cervix 1990    unsure abnormality, colpo OK  . Parkinson disease 12/15    Past Surgical History:  Procedure Laterality Date  . AUGMENTATION MAMMAPLASTY Bilateral 2004   implants, saline  . COLPOSCOPY    . DILATION AND CURETTAGE OF UTERUS  1992   following SAB    Current Outpatient Prescriptions  Medication Sig Dispense Refill  . carbidopa-levodopa (SINEMET IR) 25-100 MG per tablet Take by mouth.    . citalopram (CELEXA) 20 MG tablet Take 1 tablet by mouth daily.    . furosemide (LASIX) 40 MG tablet Take 40 mg by mouth daily as needed.    . gabapentin (NEURONTIN) 300 MG capsule Take 300 mg by mouth.    . norethindrone (MICRONOR,CAMILA,ERRIN) 0.35 MG tablet Take 1 tablet (0.35 mg total) by mouth daily. 3 Package 4   No current facility-administered medications for this visit.     Family History  Problem Relation Age of Onset   . Heart disease Mother   . Diabetes Maternal Grandmother   . Hypertension Maternal Grandmother   . Diabetes Maternal Grandfather   . Hypertension Maternal Grandfather   . Stroke Paternal Grandmother   . Heart attack Paternal Grandfather     ROS:  Pertinent items are noted in HPI.  Otherwise, a comprehensive ROS was negative.  Exam:   Ht 5' 4.75" (1.645 m)   Wt 159 lb (72.1 kg)   BMI 26.66 kg/m  Height: 5' 4.75" (164.5 cm) Ht Readings from Last 3 Encounters:  04/15/16 5' 4.75" (1.645 m)  04/02/15 5' 4.75" (1.645 m)  09/25/14 5' 5.5" (1.664 m)    General appearance: alert, cooperative and appears stated age Head: Normocephalic, without obvious abnormality, atraumatic Neck: no adenopathy, supple, symmetrical, trachea midline and thyroid normal to inspection and palpation Lungs: clear to auscultation bilaterally Breasts: normal appearance, no masses or tenderness, No nipple retraction or dimpling, No nipple discharge or bleeding, No axillary or supraclavicular adenopathy, implant noted to appear intact Heart: regular rate and rhythm Abdomen: soft, non-tender; no masses,  no organomegaly Extremities: extremities normal, atraumatic, no cyanosis or edema Skin: Skin color, texture, turgor normal. No rashes or lesions Lymph nodes: Cervical, supraclavicular, and axillary nodes normal. No abnormal inguinal nodes palpated Neurologic: Grossly normal   Pelvic: External genitalia:  no lesions              Urethra:  normal appearing  urethra with no masses, tenderness or lesions              Bartholin's and Skene's: normal                 Vagina: normal appearing vagina with normal color and discharge, no lesions              Cervix: multiparous appearance, no cervical motion tenderness and no lesions              Pap taken: No. Bimanual Exam:  Uterus:  normal size, contour, position, consistency, mobility, non-tender              Adnexa: normal adnexa and no mass, fullness, tenderness                Rectovaginal: Confirms               Anus:  normal sphincter tone, no lesions, very tiny hemorrhoid noted at anal opening, non thrombosed or tender.  Chaperone present: yes  A:  Well Woman with normal exam  Contraception POP desired  Hemorrhoid  Parkinson's disease under MD management  Screening labs  P:   Reviewed health and wellness pertinent to exam  Rx Micronor with instructions see order  Discussed Balmex use for itching and Colace stool softener prn. Increase water intake. Warning signs given and need to advise.  Continue follow up with MD as indicated  Labs: Vitamin D, Hep C  Pap smear as above not taken   counseled on breast self exam, mammography screening, use and side effects of OCP's, adequate intake of calcium and vitamin D, diet and exercise  return annually or prn  An After Visit Summary was printed and given to the patient.

## 2016-04-15 NOTE — Patient Instructions (Signed)
EXERCISE AND DIET:  We recommended that you start or continue a regular exercise program for good health. Regular exercise means any activity that makes your heart beat faster and makes you sweat.  We recommend exercising at least 30 minutes per day at least 3 days a week, preferably 4 or 5.  We also recommend a diet low in fat and sugar.  Inactivity, poor dietary choices and obesity can cause diabetes, heart attack, stroke, and kidney damage, among others.    ALCOHOL AND SMOKING:  Women should limit their alcohol intake to no more than 7 drinks/beers/glasses of wine (combined, not each!) per week. Moderation of alcohol intake to this level decreases your risk of breast cancer and liver damage. And of course, no recreational drugs are part of a healthy lifestyle.  And absolutely no smoking or even second hand smoke. Most people know smoking can cause heart and lung diseases, but did you know it also contributes to weakening of your bones? Aging of your skin?  Yellowing of your teeth and nails?  CALCIUM AND VITAMIN D:  Adequate intake of calcium and Vitamin D are recommended.  The recommendations for exact amounts of these supplements seem to change often, but generally speaking 600 mg of calcium (either carbonate or citrate) and 800 units of Vitamin D per day seems prudent. Certain women may benefit from higher intake of Vitamin D.  If you are among these women, your doctor will have told you during your visit.    PAP SMEARS:  Pap smears, to check for cervical cancer or precancers,  have traditionally been done yearly, although recent scientific advances have shown that most women can have pap smears less often.  However, every woman still should have a physical exam from her gynecologist every year. It will include a breast check, inspection of the vulva and vagina to check for abnormal growths or skin changes, a visual exam of the cervix, and then an exam to evaluate the size and shape of the uterus and  ovaries.  And after 46 years of age, a rectal exam is indicated to check for rectal cancers. We will also provide age appropriate advice regarding health maintenance, like when you should have certain vaccines, screening for sexually transmitted diseases, bone density testing, colonoscopy, mammograms, etc.   MAMMOGRAMS:  All women over 40 years old should have a yearly mammogram. Many facilities now offer a "3D" mammogram, which may cost around $50 extra out of pocket. If possible,  we recommend you accept the option to have the 3D mammogram performed.  It both reduces the number of women who will be called back for extra views which then turn out to be normal, and it is better than the routine mammogram at detecting truly abnormal areas.    COLONOSCOPY:  Colonoscopy to screen for colon cancer is recommended for all women at age 50.  We know, you hate the idea of the prep.  We agree, BUT, having colon cancer and not knowing it is worse!!  Colon cancer so often starts as a polyp that can be seen and removed at colonscopy, which can quite literally save your life!  And if your first colonoscopy is normal and you have no family history of colon cancer, most women don't have to have it again for 10 years.  Once every ten years, you can do something that may end up saving your life, right?  We will be happy to help you get it scheduled when you are ready.    Be sure to check your insurance coverage so you understand how much it will cost.  It may be covered as a preventative service at no cost, but you should check your particular policy.     Hemorrhoids Hemorrhoids are swollen veins around the rectum or anus. There are two types of hemorrhoids:   Internal hemorrhoids. These occur in the veins just inside the rectum. They may poke through to the outside and become irritated and painful.  External hemorrhoids. These occur in the veins outside the anus and can be felt as a painful swelling or hard lump near the  anus. CAUSES  Pregnancy.   Obesity.   Constipation or diarrhea.   Straining to have a bowel movement.   Sitting for long periods on the toilet.  Heavy lifting or other activity that caused you to strain.  Anal intercourse. SYMPTOMS   Pain.   Anal itching or irritation.   Rectal bleeding.   Fecal leakage.   Anal swelling.   One or more lumps around the anus.  DIAGNOSIS  Your caregiver may be able to diagnose hemorrhoids by visual examination. Other examinations or tests that may be performed include:   Examination of the rectal area with a gloved hand (digital rectal exam).   Examination of anal canal using a small tube (scope).   A blood test if you have lost a significant amount of blood.  A test to look inside the colon (sigmoidoscopy or colonoscopy). TREATMENT Most hemorrhoids can be treated at home. However, if symptoms do not seem to be getting better or if you have a lot of rectal bleeding, your caregiver may perform a procedure to help make the hemorrhoids get smaller or remove them completely. Possible treatments include:   Placing a rubber band at the base of the hemorrhoid to cut off the circulation (rubber band ligation).   Injecting a chemical to shrink the hemorrhoid (sclerotherapy).   Using a tool to burn the hemorrhoid (infrared light therapy).   Surgically removing the hemorrhoid (hemorrhoidectomy).   Stapling the hemorrhoid to block blood flow to the tissue (hemorrhoid stapling).  HOME CARE INSTRUCTIONS   Eat foods with fiber, such as whole grains, beans, nuts, fruits, and vegetables. Ask your doctor about taking products with added fiber in them (fibersupplements).  Increase fluid intake. Drink enough water and fluids to keep your urine clear or pale yellow.   Exercise regularly.   Go to the bathroom when you have the urge to have a bowel movement. Do not wait.   Avoid straining to have bowel movements.   Keep the  anal area dry and clean. Use wet toilet paper or moist towelettes after a bowel movement.   Medicated creams and suppositories may be used or applied as directed.   Only take over-the-counter or prescription medicines as directed by your caregiver.   Take warm sitz baths for 15-20 minutes, 3-4 times a day to ease pain and discomfort.   Place ice packs on the hemorrhoids if they are tender and swollen. Using ice packs between sitz baths may be helpful.   Put ice in a plastic bag.   Place a towel between your skin and the bag.   Leave the ice on for 15-20 minutes, 3-4 times a day.   Do not use a donut-shaped pillow or sit on the toilet for long periods. This increases blood pooling and pain.  SEEK MEDICAL CARE IF:  You have increasing pain and swelling that is not controlled by treatment  or medicine.  You have uncontrolled bleeding.  You have difficulty or you are unable to have a bowel movement.  You have pain or inflammation outside the area of the hemorrhoids. MAKE SURE YOU:  Understand these instructions.  Will watch your condition.  Will get help right away if you are not doing well or get worse.   This information is not intended to replace advice given to you by your health care provider. Make sure you discuss any questions you have with your health care provider.   Document Released: 09/02/2000 Document Revised: 08/22/2012 Document Reviewed: 07/10/2012 Elsevier Interactive Patient Education [employer].

## 2016-04-16 LAB — HEPATITIS C ANTIBODY: HCV Ab: NEGATIVE

## 2016-04-16 LAB — VITAMIN D 25 HYDROXY (VIT D DEFICIENCY, FRACTURES): Vit D, 25-Hydroxy: 31 ng/mL (ref 30–100)

## 2016-04-18 NOTE — Progress Notes (Signed)
Encounter reviewed [name], MD   

## 2016-05-26 ENCOUNTER — Encounter: Payer: Self-pay | Admitting: Certified Nurse Midwife

## 2016-09-22 DIAGNOSIS — M545 Low back pain, unspecified: Secondary | ICD-10-CM | POA: Insufficient documentation

## 2017-03-15 ENCOUNTER — Encounter: Payer: Self-pay | Admitting: Neurology

## 2017-04-28 ENCOUNTER — Ambulatory Visit: Payer: Federal, State, Local not specified - PPO | Admitting: Certified Nurse Midwife

## 2017-06-30 NOTE — Progress Notes (Signed)
Sarah Welch was seen today in the movement disorders clinic for neurologic consultation at the request of Leland Johns., MD.  The consultation is for the evaluation of Parkinsons disease.  Patient has also previously seen Dr. Linus Mako.  I have reviewed records from Dr. Linus Mako and Dr. Jannifer Franklin.  Patient has had sx's since 2012.  First sx's were L sided tremor.  She was started on levodopa in 2015.  She noticed an immediate benefit.  She attempted pramipexole in 2016.  She did not take it long.  She felt "bloated and achy" on pramipexole and did not want to take the medication and discontinued it.  She was last seen in June, 2018 by Dr. Jannifer Franklin.  She tried Azilect at that time.  She noticed no benefit and had difficulty affording the medication and it was discontinued.  She is currently taking carbidopa/levodopa 25/100, 1 tablet 5 times per day (6am/9am/12pm/3pm/5pm).  If she gets them in this way, she will note no wearing off.  If she is late, she will note heaviness of the L arm.   Specific Symptoms:  Tremor: Yes.  , L arm Family hx of similar:  No. Voice: no change Sleep: trouble staying asleep  Vivid Dreams:  No.  Acting out dreams:  No. Wet Pillows: No. Postural symptoms:  Yes.  , not perfect but not bad either.  Sometimes will walk with walking pole.  Walks for exercise  Falls?  No. Bradykinesia symptoms: no bradykinesia noted, unless she is due for medication and then maybe shortened stride length Loss of smell:  Yes.   Loss of taste:  No. Urinary Incontinence:  No. Difficulty Swallowing:  No. Handwriting, micrographia: No. Trouble with ADL's:  No. (minor wobbly when putting on pants with standing up)  Trouble buttoning clothing: No. Depression:  Yes.  , minor (celexa was very helpful but not as helpful over the last 6 months) Memory changes:  No., better now than in mid 2000's Hallucinations:  No.  visual distortions: No. N/V:  No. Lightheaded:  No.  Syncope: No. Diplopia:   No. Dyskinesia:  No.  Neuroimaging of the brain has previously been performed.  Reports from Dr. Jannifer Franklin indicate that MRI Brain showed punctate lacunar infarct in the right thalamus. MRI Cspine shows DDD causing severe right NFS C3-4 and severe left NFS C4-5.  I do not have films.  PREVIOUS MEDICATIONS: Sinemet, Mirapex and azilect (costly)  ALLERGIES:   Allergies  Allergen Reactions  . Ciprofloxacin Other (See Comments)    SEVERE JOINT PAIN  . Pramipexole Other (See Comments)    Could not get up for 24 hours    CURRENT MEDICATIONS:  Outpatient Encounter Prescriptions as of 07/04/2017  Medication Sig  . carbidopa-levodopa (SINEMET IR) 25-100 MG per tablet Take 1 tablet by mouth 5 (five) times daily.   . citalopram (CELEXA) 20 MG tablet Take 1 tablet by mouth daily.  Marland Kitchen gabapentin (NEURONTIN) 300 MG capsule Take 600 mg by mouth at bedtime.   . [DISCONTINUED] azelastine (ASTELIN) 0.1 % nasal spray 3 times/day as needed-between meals & bedtime.  . [DISCONTINUED] montelukast (SINGULAIR) 10 MG tablet Take 10 mg by mouth.  . [DISCONTINUED] norethindrone (MICRONOR,CAMILA,ERRIN) 0.35 MG tablet Take 1 tablet (0.35 mg total) by mouth daily.   No facility-administered encounter medications on file as of 07/04/2017.     PAST MEDICAL HISTORY:   Past Medical History:  Diagnosis Date  . Abnormal Pap smear of cervix 1990    unsure abnormality, colpo  OK  . Parkinson disease ([place]) 12/15    PAST SURGICAL HISTORY:   Past Surgical History:  Procedure Laterality Date  . AUGMENTATION MAMMAPLASTY Bilateral 2004   implants, saline  . COLPOSCOPY    . DILATION AND CURETTAGE OF UTERUS  1992   following SAB    SOCIAL HISTORY:   Social History   Social History  . Marital status: Married    Spouse name: N/A  . Number of children: 1  . 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 Smoking status: Former Smoker    Packs/day: 0.50      Types: Cigarettes    Quit date: 07/04/2016  . Smokeless tobacco: Never Used  . Alcohol use No  . Drug use: No  . Sexual activity: Yes    Partners: Male    Birth control/ protection: Pill   Other Topics Concern  . Not on file   Social History Narrative  . No narrative on file    FAMILY HISTORY:   Family Status  Relation Status  . Mother Alive  . MGM Deceased  . MGF Deceased  . PGM Deceased       stroke  . PGF Deceased       MI  . Father Alive  . Sister Alive  . Daughter Alive    ROS:  A complete 10 system review of systems was obtained and was unremarkable apart from what is mentioned above.  PHYSICAL EXAMINATION:    VITALS:   Vitals:   07/04/17 0926  BP: 124/84  Pulse: 74  SpO2: 98%  Weight: 170 lb (77.1 kg)  Height: 5' 6"  (1.676 m)    GEN:  The patient appears stated age and is in NAD. HEENT:  Normocephalic, atraumatic.  The mucous membranes are moist. The superficial temporal arteries are without ropiness or tenderness. CV:  RRR Lungs:  CTAB Neck/HEME:  There are no carotid bruits bilaterally.  Neurological examination:  Orientation: The patient is alert and oriented x3. Fund of knowledge is appropriate.  Recent and remote memory are intact.  Attention and concentration are normal.    Able to name objects and repeat phrases. Cranial nerves: There is good facial symmetry. Pupils are equal round and reactive to light bilaterally. Fundoscopic exam reveals clear margins bilaterally. Extraocular muscles are intact. The visual fields are full to confrontational testing. The speech is fluent and clear. Soft palate rises symmetrically and there is no tongue deviation. Hearing is intact to conversational tone. Sensation: Sensation is intact to light and pinprick throughout (facial, trunk, extremities). Vibration is intact at the bilateral big toe. There is no extinction with double simultaneous stimulation. There is no sensory dermatomal level identified. Motor:  Strength is 5/5 in the bilateral upper and lower extremities.   Shoulder shrug is equal and symmetric.  There is no pronator drift. Deep tendon reflexes: Deep tendon reflexes are 2/4 at the bilateral biceps, triceps, brachioradialis, patella and achilles. Plantar responses are downgoing bilaterally.  Movement examination: Tone: There is normal tone in the bilateral UE, even with activation.  Normal tone in the bilateral LE Abnormal movements: none even with distraction Coordination:  There is decremation with RAM's, with any form of RAMS, including alternating supination and pronation of the forearm, hand opening and closing, finger taps, heel taps and toe taps on the L only Gait and Station: The patient has no difficulty arising out of a  deep-seated chair without the use of the hands. The patient's stride length is normal.  The patient has a negative pull test.      Labs:  Reviewed last labs from 10/13/16.  TSH 0.805   ASSESSMENT/PLAN:  1.  Idiopathic Parkinson's disease.    -We discussed the diagnosis as well as pathophysiology of the disease.  We discussed treatment options as well as prognostic indicators.  Patient education was provided.  -We discussed that it used to be thought that levodopa would increase risk of melanoma but now it is believed that Parkinsons itself likely increases risk of melanoma. she is to get regular skin checks.  -Greater than 50% of the 60 minute visit was spent in counseling answering questions and talking about what to expect now as well as in the future.  We talked about medication options as well as potential future surgical options.  We talked about safety in the home.  -We decided to continue carbidopa/levodopa 25/100, 1 po five times per day  -At some point would like to evaluate her off of medication.  By definition patients with PD need to have symptoms on the opposite side of the body within 3 years of dx.  She has had sx's since 2012 and appears dx in  about 2015.  She has a thalamic lesion/infarct on the right on MRI.  I will try to get a copy of that MRI for review.  -We discussed community resources in the area including patient support groups and community exercise programs for PD and pt education was provided to the patient.  She met with our PD social worker today  2.  RLS  -using gabapentin, 300 mg, 2 po q hs prn.  She finds that helpful.  If it isn't in the future, I will try nighttime carbidopa/levodopa 50/200.  3.  Follow up is anticipated in the next few months, sooner should new neurologic issues arise.  There was 40 min of record review which was detailed above, which was non face to face time.   Cc:  [place], [place], [place]

## 2017-07-04 ENCOUNTER — Encounter: Payer: Self-pay | Admitting: Neurology

## 2017-07-04 ENCOUNTER — Ambulatory Visit (INDEPENDENT_AMBULATORY_CARE_PROVIDER_SITE_OTHER): Payer: Federal, State, Local not specified - PPO | Admitting: Neurology

## 2017-07-04 VITALS — BP 124/84 | HR 74 | Ht 66.0 in | Wt 170.0 lb

## 2017-07-04 DIAGNOSIS — G2581 Restless legs syndrome: Secondary | ICD-10-CM

## 2017-07-04 DIAGNOSIS — G20 Parkinson's disease: Secondary | ICD-10-CM

## 2017-07-04 NOTE — Patient Instructions (Signed)
Great to see you!  I want you to get a yearly derm visit.  I also want you to get in great cardiovascular exercise.  Let me know when you need medication refilled.

## 2017-07-14 DIAGNOSIS — F411 Generalized anxiety disorder: Secondary | ICD-10-CM | POA: Diagnosis not present

## 2017-07-14 DIAGNOSIS — R350 Frequency of micturition: Secondary | ICD-10-CM | POA: Diagnosis not present

## 2017-07-14 DIAGNOSIS — N3000 Acute cystitis without hematuria: Secondary | ICD-10-CM | POA: Diagnosis not present

## 2017-08-29 ENCOUNTER — Ambulatory Visit: Payer: Self-pay | Admitting: Neurology

## 2017-10-12 ENCOUNTER — Encounter: Payer: Self-pay | Admitting: Neurology

## 2017-10-12 MED ORDER — CARBIDOPA-LEVODOPA 25-100 MG PO TABS: 1.0000 | ORAL_TABLET | Freq: Every day | ORAL | 0 refills | Status: DC

## 2017-11-17 DIAGNOSIS — F419 Anxiety disorder, unspecified: Secondary | ICD-10-CM | POA: Diagnosis not present

## 2017-11-30 ENCOUNTER — Encounter: Payer: Self-pay | Admitting: Family Medicine

## 2017-11-30 ENCOUNTER — Encounter: Payer: Self-pay | Admitting: Neurology

## 2017-11-30 ENCOUNTER — Ambulatory Visit: Payer: Federal, State, Local not specified - PPO | Admitting: Family Medicine

## 2017-11-30 VITALS — BP 128/84 | HR 93 | Temp 97.8°F | Wt 161.0 lb

## 2017-11-30 DIAGNOSIS — J029 Acute pharyngitis, unspecified: Secondary | ICD-10-CM | POA: Diagnosis not present

## 2017-11-30 LAB — POCT RAPID STREP A (OFFICE): Rapid Strep A Screen: NEGATIVE

## 2017-11-30 MED ORDER — AMOXICILLIN-POT CLAVULANATE 875-125 MG PO TABS: 1.0000 | ORAL_TABLET | Freq: Two times a day (BID) | ORAL | 0 refills | Status: AC

## 2017-11-30 NOTE — Patient Instructions (Signed)
Please take Augmentin as directed- 1 tablet twice daily for 10 days.  Ibuprofen up to 800 mg three times daily with for the next several days. Okay to continue sudafed, flonase OTC.

## 2017-11-30 NOTE — Progress Notes (Signed)
SUBJECTIVE:  Sarah Welch is a 47 y.o. female who complains of coryza, congestion, sore throat, bilateral sinus pain, left ear pain and chills for 4 days. She denies a history of anorexia and chest pain and denies a history of asthma. Patient denies smoke cigarettes.   Current Outpatient Medications on File Prior to Visit  Medication Sig Dispense Refill  . carbidopa-levodopa (SINEMET IR) 25-100 MG tablet Take 1 tablet by mouth 5 (five) times daily. 450 tablet 0  . citalopram (CELEXA) 20 MG tablet Take 1 tablet by mouth daily.    Marland Kitchen gabapentin (NEURONTIN) 300 MG capsule Take 600 mg by mouth at bedtime.      No current facility-administered medications on file prior to visit.     Allergies  Allergen Reactions  . Ciprofloxacin Other (See Comments)    SEVERE JOINT PAIN  . Pramipexole Other (See Comments)    Could not get up for 24 hours    Past Medical History:  Diagnosis Date  . Abnormal Pap smear of cervix 1990    unsure abnormality, colpo OK  . Parkinson disease ([place]) 12/15    Past Surgical History:  Procedure Laterality Date  . AUGMENTATION MAMMAPLASTY Bilateral 2004   implants, saline  . COLPOSCOPY    . DILATION AND CURETTAGE OF UTERUS  1992   following SAB    Family History  Problem Relation Age of Onset  . Heart disease Mother   . Diabetes Maternal Grandmother   . Hypertension Maternal Grandmother   . Diabetes Maternal Grandfather   . Hypertension Maternal Grandfather   . Stroke Paternal Grandmother   . Heart attack Paternal Grandfather     Social History   Socioeconomic History  . Marital status: Married    Spouse name: Not on file  . Number of children: 1  . Years of education: Not on file  . Highest education level: Not on file  Social Needs  . Financial resource strain: Not on file  . Food insecurity - worry: Not on file  . Food insecurity - inability: Not on file  . Transportation needs - medical: Not on file  . 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 Smoking status: Former Smoker    Packs/day: 0.50    Types: Cigarettes    Last attempt to quit: 07/04/2016    Years since quitting: 1.4  . Smokeless tobacco: Never Used  Substance and Sexual Activity  . Alcohol use: No    Alcohol/week: 0.0 oz  . Drug use: No  . Sexual activity: Yes    Partners: Male    Birth control/protection: Pill  Other Topics Concern  . Not on file  Social History Narrative  . Not on file   The PMH, PSH, Social History, Family History, Medications, and allergies have been reviewed in [facility], and have been updated if relevant.  OBJECTIVE: BP 128/84 (BP Location: Left Arm, Patient Position: Sitting, Cuff Size: Normal)   Pulse 93   Temp 97.8 F (36.6 C) (Oral)   Wt 161 lb (73 kg)   SpO2 97%   BMI 25.99 kg/m   She appears well, vital signs are as noted. Left TM erythematous and dull.  Throat and pharynx normal.  Neck supple. No adenopathy in the neck. Nose is congested. Sinuses non tender. The chest is clear, without wheezes or rales.  ASSESSMENT:  otitis media  PLAN: Augmentin twice daily x 10 days. Symptomatic therapy  suggested: push fluids, rest and return office visit prn if symptoms persist or worsen.  Call or return to clinic prn if these symptoms worsen or fail to improve as anticipated.

## 2017-12-05 NOTE — Progress Notes (Signed)
Sarah Welch was seen today in the movement disorders clinic for neurologic consultation at the request of [place], [place], *.  The consultation is for the evaluation of Parkinsons disease.  Patient has also previously seen Dr. Linus Mako.  I have reviewed records from Dr. Linus Mako and Dr. Jannifer Franklin.  Patient has had sx's since 2012.  First sx's were L sided tremor.  She was started on levodopa in 2015.  She noticed an immediate benefit.  She attempted pramipexole in 2016.  She did not take it long.  She felt "bloated and achy" on pramipexole and did not want to take the medication and discontinued it.  She was last seen in June, 2018 by Dr. Jannifer Franklin.  She tried Azilect at that time.  She noticed no benefit and had difficulty affording the medication and it was discontinued.  She is currently taking carbidopa/levodopa 25/100, 1 tablet 5 times per day (6am/9am/12pm/3pm/5pm).  If she gets them in this way, she will note no wearing off.  If she is late, she will note heaviness of the L arm.   12/07/17 update: The patient is seen today in follow-up.  She is on carbidopa/levodopa 25/100, 1 tablet 5 times per day (6 AM/9 AM/noon/3 PM/5 PM).  Pt denies falls.  Pt denies lightheadedness, near syncope.  No hallucinations.  Mood has been good.  On gabapentin for RLS.  Working well.  She actually stopped taking it for a few nights b/c was taking other meds (abx) and she did pretty well.  The records that were made available to me were reviewed.  Saw her primary care PA on 11/17/17 and saw Dr. Deborra Medina for otitis media on 11/20/17.  She is doing better in that regard.  She joined a gym since last visit and was faithful with this before she got her otitis.    PREVIOUS MEDICATIONS: Sinemet, Mirapex and azilect (costly)  ALLERGIES:   Allergies  Allergen Reactions  . Ciprofloxacin Other (See Comments)    SEVERE JOINT PAIN  . Pramipexole Other (See Comments)    Could not get up for 24 hours    CURRENT MEDICATIONS:    Outpatient Encounter Medications as of 12/07/2017  Medication Sig  . amoxicillin-clavulanate (AUGMENTIN) 875-125 MG tablet Take 1 tablet by mouth 2 (two) times daily for 10 days.  . carbidopa-levodopa (SINEMET IR) 25-100 MG tablet Take 1 tablet by mouth 5 (five) times daily.  . citalopram (CELEXA) 20 MG tablet Take 1 tablet by mouth daily.  Marland Kitchen gabapentin (NEURONTIN) 300 MG capsule Take 600 mg by mouth at bedtime.   . [DISCONTINUED] carbidopa-levodopa (SINEMET IR) 25-100 MG tablet Take 1 tablet by mouth 5 (five) times daily.   No facility-administered encounter medications on file as of 12/07/2017.     PAST MEDICAL HISTORY:   Past Medical History:  Diagnosis Date  . Abnormal Pap smear of cervix 1990    unsure abnormality, colpo OK  . Parkinson disease ([place]) 12/15    PAST SURGICAL HISTORY:   Past Surgical History:  Procedure Laterality Date  . AUGMENTATION MAMMAPLASTY Bilateral 2004   implants, saline  . COLPOSCOPY    . DILATION AND CURETTAGE OF UTERUS  1992   following SAB    SOCIAL HISTORY:   Social History   Socioeconomic History  . Marital status: Married    Spouse name: Not on file  . Number of children: 1  . Years of education: Not on file  . 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 Financial resource strain: Not on file  . Food insecurity:    Worry: Not on file    Inability: Not on file  . Transportation needs:    Medical: Not on file    Non-medical: Not on file  Tobacco Use  . Smoking status: Former Smoker    Packs/day: 0.50    Types: Cigarettes    Last attempt to quit: 07/04/2016    Years since quitting: 1.4  . Smokeless tobacco: Never Used  Substance and Sexual Activity  . Alcohol use: No    Alcohol/week: 0.0 oz  . Drug use: No  . Sexual activity: Yes    Partners: Male    Birth control/protection: Pill  Lifestyle  . Physical activity:    Days per week: Not on file    Minutes per  session: Not on file  . Stress: Not on file  Relationships  . Social connections:    Talks on phone: Not on file    Gets together: Not on file    Attends religious service: Not on file    Active member of club or organization: Not on file    Attends meetings of clubs or organizations: Not on file    Relationship status: Not on file  . Intimate partner violence:    Fear of current or ex partner: Not on file    Emotionally abused: Not on file    Physically abused: Not on file    Forced sexual activity: Not on file  Other Topics Concern  . Not on file  Social History Narrative  . Not on file    FAMILY HISTORY:   Family Status  Relation Name Status  . Mother  Alive  . MGM  Deceased  . MGF  Deceased  . PGM  Deceased       stroke  . PGF  Deceased       MI  . Father  Alive  . Sister  Alive  . Daughter  Alive    ROS:  Review of Systems  Constitutional: Negative.   HENT: Positive for congestion.   Eyes: Negative.   Respiratory: Negative.   Cardiovascular: Negative.   Gastrointestinal: Negative.   Genitourinary: Negative.   Musculoskeletal: Negative.   Skin: Negative.   Endo/Heme/Allergies: Negative.   Psychiatric/Behavioral: Negative.     PHYSICAL EXAMINATION:    VITALS:   Vitals:   12/07/17 1048  BP: 110/72  Pulse: 74  SpO2: 98%  Weight: 167 lb (75.8 kg)  Height: 5' 6" (1.676 m)    Physical Exam  Constitutional: She is oriented to person, place, and time. She appears well-developed and well-nourished.  Neck:  No bruits   Cardiovascular: Normal rate and regular rhythm.  Pulmonary/Chest: Breath sounds normal.  Neurological: She is alert and oriented to person, place, and time. She has normal strength. She displays no tremor. No cranial nerve deficit or sensory deficit. She displays a negative Romberg sign.    Movement examination: Tone: There is normal tone in the bilateral UE, even with activation.  Normal tone in the bilateral LE Abnormal movements:  no tremor.  Possible mild dyskinesia in the L foot Coordination:  There is no decremation, with any form of RAMS, including alternating supination and pronation of the forearm, hand opening and closing, finger taps, heel taps and toe taps bilaterally Gait and Station: The patient has no difficulty arising out of a deep-seated chair without the use   of the hands. The patient's stride length is normal.  The patient has a negative pull test.      Labs:  Reviewed last labs from 10/13/16.  TSH 0.805   ASSESSMENT/PLAN:  1.  Idiopathic Parkinson's disease.    -We discussed the diagnosis as well as pathophysiology of the disease.  We discussed treatment options as well as prognostic indicators.  Patient education was provided.  -We discussed that it used to be thought that levodopa would increase risk of melanoma but now it is believed that Parkinsons itself likely increases risk of melanoma. she is to get regular skin checks.  -We decided to continue carbidopa/levodopa 25/100, 1 po five times per day  -I would like to try and see her off medication next visit.  I told her to make the appointment in the morning, so that it does not interfere with her job.  -We discussed community resources.  I think she would do very well in our new thrive and connect group.  She has many questions and I answered them to the best of my ability.  She is very interested in this group.  We will get her the information on this.  She met with my Education officer, museum about this as well.    2.  RLS  -using gabapentin, 300 mg, 2 po q hs prn.  She is not sure that she even needs it.  She can use it on an as-needed basis.    If it isn't in the future, I will try nighttime carbidopa/levodopa 50/200.  3.  Follow up is anticipated in the next few months, sooner should new neurologic issues arise.  Much greater than 50% of this visit was spent in counseling and coordinating care.  Total face to face time:  25 min   Cc:  [place], [place], [place]

## 2017-12-07 ENCOUNTER — Ambulatory Visit: Payer: Federal, State, Local not specified - PPO | Admitting: Neurology

## 2017-12-07 ENCOUNTER — Encounter: Payer: Self-pay | Admitting: Neurology

## 2017-12-07 VITALS — BP 110/72 | HR 74 | Ht 66.0 in | Wt 167.0 lb

## 2017-12-07 DIAGNOSIS — G2581 Restless legs syndrome: Secondary | ICD-10-CM

## 2017-12-07 DIAGNOSIS — G20 Parkinson's disease: Secondary | ICD-10-CM

## 2017-12-07 MED ORDER — CARBIDOPA-LEVODOPA 25-100 MG PO TABS: 1.0000 | ORAL_TABLET | Freq: Every day | ORAL | 1 refills | Status: DC

## 2017-12-07 NOTE — Patient Instructions (Signed)
  [facility]  The [facility] team know that living well with a movement disorder extends far beyond our clinic walls. We are together with you. Our team is passionate about providing resources to you and your loved ones who are living with Parkinson's disease and movement disorders. Participate in these programs and join our community. These resources are free or low cost!   [facility] is adding:   Innovative educational programs for patients and caregivers.   Support groups for patients and caregivers living with Parkinson's disease.   Parkinson's specific exercise programs.   Custom tailored therapeutic programs that will benefit patient's living with Parkinson's disease.   We are in this together. You can help and contribute to grow these programs and resources in our community. 100% of the funds donated to the Movement Disorders Fund stays right here in our community to support patients and their caregivers.  To make a tax deductible contribution:  -ask for a [facility] Parkinson's envelope in the office today.  - call the Office of Institutional Advancement at [phone].         

## 2018-02-21 DIAGNOSIS — Z1231 Encounter for screening mammogram for malignant neoplasm of breast: Secondary | ICD-10-CM | POA: Diagnosis not present

## 2018-02-22 DIAGNOSIS — D225 Melanocytic nevi of trunk: Secondary | ICD-10-CM | POA: Diagnosis not present

## 2018-02-22 DIAGNOSIS — D1801 Hemangioma of skin and subcutaneous tissue: Secondary | ICD-10-CM | POA: Diagnosis not present

## 2018-02-22 DIAGNOSIS — L821 Other seborrheic keratosis: Secondary | ICD-10-CM | POA: Diagnosis not present

## 2018-02-22 DIAGNOSIS — L814 Other melanin hyperpigmentation: Secondary | ICD-10-CM | POA: Diagnosis not present

## 2018-03-05 ENCOUNTER — Encounter: Payer: Self-pay | Admitting: Certified Nurse Midwife

## 2018-03-09 ENCOUNTER — Ambulatory Visit: Payer: Federal, State, Local not specified - PPO | Admitting: Certified Nurse Midwife

## 2018-03-27 ENCOUNTER — Ambulatory Visit: Payer: Federal, State, Local not specified - PPO | Admitting: Family Medicine

## 2018-03-27 ENCOUNTER — Encounter: Payer: Self-pay | Admitting: Family Medicine

## 2018-03-27 VITALS — BP 128/76 | HR 74 | Temp 97.9°F | Ht 66.0 in

## 2018-03-27 DIAGNOSIS — R300 Dysuria: Secondary | ICD-10-CM

## 2018-03-27 DIAGNOSIS — N3090 Cystitis, unspecified without hematuria: Secondary | ICD-10-CM | POA: Diagnosis not present

## 2018-03-27 LAB — POCT URINALYSIS DIPSTICK
Bilirubin, UA: NEGATIVE
Glucose, UA: NEGATIVE
KETONES UA: NEGATIVE
NITRITE UA: NEGATIVE
PH UA: 6 (ref 5.0–8.0)
PROTEIN UA: NEGATIVE
SPEC GRAV UA: 1.015 (ref 1.010–1.025)
UROBILINOGEN UA: 0.2 U/dL

## 2018-03-27 MED ORDER — NITROFURANTOIN MONOHYD MACRO 100 MG PO CAPS: 100.0000 mg | ORAL_CAPSULE | Freq: Two times a day (BID) | ORAL | 0 refills | Status: AC

## 2018-03-27 NOTE — Progress Notes (Signed)
Subjective:  Patient ID: Sarah Welch, female    DOB: 10-29-1970  Age: 47 y.o. MRN: [id number]  CC: Urinary Tract Infection   HPI Sarah Welch presents for 1 day history of dysuria and urinary frequency.  Patient denies fevers chills abdominal pain nausea or vomiting.  Distant history of UTI.  Outpatient Medications Prior to Visit  Medication Sig Dispense Refill  . carbidopa-levodopa (SINEMET IR) 25-100 MG tablet Take 1 tablet by mouth 5 (five) times daily. 450 tablet 1  . citalopram (CELEXA) 20 MG tablet Take 1 tablet by mouth daily.    Marland Kitchen gabapentin (NEURONTIN) 300 MG capsule Take 600 mg by mouth at bedtime.      No facility-administered medications prior to visit.     ROS Review of Systems  Constitutional: Negative for chills, fatigue and fever.  Respiratory: Negative.   Cardiovascular: Negative.   Gastrointestinal: Negative.   Endocrine: Negative for polyphagia and polyuria.  Genitourinary: Positive for dysuria and frequency. Negative for difficulty urinating.  Musculoskeletal: Negative for arthralgias and back pain.  Neurological: Negative.   Hematological: Negative.  Does not bruise/bleed easily.  Psychiatric/Behavioral: Negative.     Objective:  BP 128/76   Pulse 74   Temp 97.9 F (36.6 C)   Ht 5\' 6"  (1.676 m)   SpO2 98%   BMI 26.95 kg/m   BP Readings from Last 3 Encounters:  03/27/18 128/76  12/07/17 110/72  11/30/17 128/84    Wt Readings from Last 3 Encounters:  12/07/17 167 lb (75.8 kg)  11/30/17 161 lb (73 kg)  07/04/17 170 lb (77.1 kg)    Physical Exam  Constitutional: She is oriented to person, place, and time. She appears well-developed and well-nourished. No distress.  HENT:  Head: Normocephalic and atraumatic.  Right Ear: External ear normal.  Eyes: Right eye exhibits no discharge. Left eye exhibits no discharge. No scleral icterus.  Cardiovascular: Normal rate, regular rhythm and normal heart sounds.  Pulmonary/Chest: Effort normal  and breath sounds normal.  Abdominal: There is no tenderness. There is no CVA tenderness.  Neurological: She is alert and oriented to person, place, and time.  Skin: She is not diaphoretic.  Psychiatric: She has a normal mood and affect. Her speech is normal and behavior is normal. Thought content normal.    Lab Results  Component Value Date   WBC 7.8 04/02/2015   HGB 13.0 04/02/2015   HCT 40.6 04/02/2015   PLT 219 04/02/2015   GLUCOSE 91 04/02/2015   CHOL 134 04/02/2015   TRIG 53 04/02/2015   HDL 54 04/02/2015   LDLCALC 69 04/02/2015   ALT 12 04/02/2015   AST 17 04/02/2015   NA 137 04/02/2015   K 4.8 04/02/2015   CL 103 04/02/2015   CREATININE 0.77 04/02/2015   BUN 11 04/02/2015   CO2 23 04/02/2015   TSH 1.181 03/27/2014   HGBA1C 5.1 03/27/2014    Patient was never admitted.  Assessment & Plan:   Sarah Welch was seen today for urinary tract infection.  Diagnoses and all orders for this visit:  Dysuria -     POCT urinalysis dipstick -     Urine Culture  Cystitis -     POCT urinalysis dipstick -     Urine Culture -     nitrofurantoin, macrocrystal-monohydrate, (MACROBID) 100 MG capsule; Take 1 capsule (100 mg total) by mouth 2 (two) times daily for 7 days.   I am having Sarah Welch start on nitrofurantoin (macrocrystal-monohydrate). I am also having her  maintain her citalopram, gabapentin, and carbidopa-levodopa.  Meds ordered this encounter  Medications  . nitrofurantoin, macrocrystal-monohydrate, (MACROBID) 100 MG capsule    Sig: Take 1 capsule (100 mg total) by mouth 2 (two) times daily for 7 days.    Dispense:  14 capsule    Refill:  0     Follow-up: No follow-ups on file.  Libby Maw, MD

## 2018-03-28 LAB — URINE CULTURE
MICRO NUMBER: 90811126
SPECIMEN QUALITY:: ADEQUATE

## 2018-03-30 ENCOUNTER — Encounter: Payer: Self-pay | Admitting: Certified Nurse Midwife

## 2018-03-30 ENCOUNTER — Other Ambulatory Visit (HOSPITAL_COMMUNITY)
Admission: RE | Admit: 2018-03-30 | Discharge: 2018-03-30 | Disposition: A | Discharge status: Home / Self Care | Payer: Federal, State, Local not specified - PPO | Source: Ambulatory Visit | Attending: Obstetrics & Gynecology | Admitting: Obstetrics & Gynecology

## 2018-03-30 ENCOUNTER — Ambulatory Visit (INDEPENDENT_AMBULATORY_CARE_PROVIDER_SITE_OTHER): Payer: Federal, State, Local not specified - PPO | Admitting: Certified Nurse Midwife

## 2018-03-30 ENCOUNTER — Other Ambulatory Visit: Payer: Self-pay

## 2018-03-30 VITALS — BP 118/78 | HR 68 | Resp 16 | Ht 64.75 in | Wt 158.0 lb

## 2018-03-30 DIAGNOSIS — Z1211 Encounter for screening for malignant neoplasm of colon: Secondary | ICD-10-CM | POA: Diagnosis not present

## 2018-03-30 DIAGNOSIS — E559 Vitamin D deficiency, unspecified: Secondary | ICD-10-CM

## 2018-03-30 DIAGNOSIS — Z01419 Encounter for gynecological examination (general) (routine) without abnormal findings: Secondary | ICD-10-CM

## 2018-03-30 DIAGNOSIS — N912 Amenorrhea, unspecified: Secondary | ICD-10-CM

## 2018-03-30 DIAGNOSIS — N951 Menopausal and female climacteric states: Secondary | ICD-10-CM

## 2018-03-30 DIAGNOSIS — Z124 Encounter for screening for malignant neoplasm of cervix: Secondary | ICD-10-CM | POA: Insufficient documentation

## 2018-03-30 DIAGNOSIS — Z0000 Encounter for general adult medical examination without abnormal findings: Secondary | ICD-10-CM | POA: Diagnosis not present

## 2018-03-30 LAB — POCT URINE PREGNANCY: PREG TEST UR: NEGATIVE

## 2018-03-30 NOTE — Progress Notes (Signed)
47 y.o. G27P1011 Married  Caucasian Fe here for annual exam.Patient is menopausal now. No period since last visit here.  Denies vaginal bleeding or vaginal dryness. Having hot flashes and night sweats, but no issues with insomnia. Sees Dr Belva Bertin for medication management of anxiety with Celexa, which she feels is helping with hot flashes. Parkinson's being managed by Neurology and is stable per patient at this time. No other health issues at this time.  No LMP recorded. (Menstrual status: Other).      LMP was 01/14/16   Sexually active: Yes.    The current method of family planning is none.    Exercising: Yes.    cardio & weights Smoker:  no  Health Maintenance: Pap:  04-02-15 neg History of Abnormal Pap: yes MMG:  02-21-18 category b density birads 2:neg Self Breast exams: yes Colonoscopy:  none BMD:   none TDaP:  2009 Shingles: no Pneumonia: no Hep C and HIV: Hep c neg 2017 Labs: upt-neg   reports that she quit smoking about 20 months ago. Her smoking use included cigarettes. She smoked 0.50 packs per day. She has never used smokeless tobacco. She reports that she does not drink alcohol or use drugs.  Past Medical History:  Diagnosis Date  . Abnormal Pap smear of cervix 1990    unsure abnormality, colpo OK  . Parkinson disease ([place]) 12/15    Past Surgical History:  Procedure Laterality Date  . AUGMENTATION MAMMAPLASTY Bilateral 2004   implants, saline  . COLPOSCOPY    . DILATION AND CURETTAGE OF UTERUS  1992   following SAB    Current Outpatient Medications  Medication Sig Dispense Refill  . carbidopa-levodopa (SINEMET IR) 25-100 MG tablet Take 1 tablet by mouth 5 (five) times daily. 450 tablet 1  . citalopram (CELEXA) 20 MG tablet Take 1 tablet by mouth daily.    Marland Kitchen gabapentin (NEURONTIN) 300 MG capsule Take 600 mg by mouth at bedtime.     . nitrofurantoin, macrocrystal-monohydrate, (MACROBID) 100 MG capsule Take 1 capsule (100 mg total) by mouth 2 (two) times daily for 7  days. 14 capsule 0   No current facility-administered medications for this visit.     Family History  Problem Relation Age of Onset  . Heart disease Mother   . Diabetes Maternal Grandmother   . Hypertension Maternal Grandmother   . Diabetes Maternal Grandfather   . Hypertension Maternal Grandfather   . Stroke Paternal Grandmother   . Heart attack Paternal Grandfather     ROS:  Pertinent items are noted in HPI.  Otherwise, a comprehensive ROS was negative.  Exam:   BP 118/78   Pulse 68   Resp 16   Ht 5' 4.75" (1.645 m)   Wt 158 lb (71.7 kg)   BMI 26.50 kg/m  Height: 5' 4.75" (164.5 cm) Ht Readings from Last 3 Encounters:  03/30/18 5' 4.75" (1.645 m)  03/27/18 5\' 6"  (1.676 m)  12/07/17 5\' 6"  (1.676 m)    General appearance: alert, cooperative and appears stated age Head: Normocephalic, without obvious abnormality, atraumatic Neck: no adenopathy, supple, symmetrical, trachea midline and thyroid normal to inspection and palpation Lungs: clear to auscultation bilaterally Breasts: normal appearance, no masses or tenderness, No nipple retraction or dimpling, No nipple discharge or bleeding, No axillary or supraclavicular adenopathy Heart: regular rate and rhythm Abdomen: soft, non-tender; no masses,  no organomegaly Extremities: extremities normal, atraumatic, no cyanosis or edema Skin: Skin color, texture, turgor normal. No rashes or lesions Lymph nodes:  Cervical, supraclavicular, and axillary nodes normal. No abnormal inguinal nodes palpated Neurologic: Grossly normal   Pelvic: External genitalia:  no lesions              Urethra:  normal appearing urethra with no masses, tenderness or lesions              Bartholin's and Skene's: normal                 Vagina: normal appearing vagina with normal color and discharge, no lesions              Cervix: no cervical motion tenderness, no lesions and normal appearance              Pap taken: Yes.   Bimanual Exam:  Uterus:   normal size, contour, position, consistency, mobility, non-tender and anteverted              Adnexa: normal adnexa and no mass, fullness, tenderness               Rectovaginal: Confirms               Anus:  normal sphincter tone, no lesions  Chaperone present: yes  A:  Well Woman with normal exam  Menopausal no HRT  Anxiety with Celexa use with good results with MD management  Parkinson's disease stable with Neurology care  Vaginal dryness  Screening labs  Smoker, not interested in cessation  Colonoscopy due  Immunization update  P:   Reviewed health and wellness pertinent to exam  Discussed need to advise if vaginal bleeding which would be considered post menopausal and would be a concern. Questions addressed.  Continue follow up with MD as indicated.  Discussed OTC treatment for vaginal dryness if needed with coconut or Olive oil use.  Labs: CBC, Lipid panel, CMP, TSH, Vitamin D,FSH  Risks/benefits given declines scheduling. IFOB dispensed with instructions.  Declines TDAP today  Pap smear: yes   counseled on breast self exam, mammography screening, STD prevention, HIV risk factors and prevention, feminine hygiene, menopause, adequate intake of calcium and vitamin D, diet and exercise  return annually or prn  An After Visit Summary was printed and given to the patient.

## 2018-03-31 LAB — CBC
HEMATOCRIT: 43 % (ref 34.0–46.6)
Hemoglobin: 14.5 g/dL (ref 11.1–15.9)
MCH: 31 pg (ref 26.6–33.0)
MCHC: 33.7 g/dL (ref 31.5–35.7)
MCV: 92 fL (ref 79–97)
PLATELETS: 238 10*3/uL (ref 150–450)
RBC: 4.67 x10E6/uL (ref 3.77–5.28)
RDW: 13.2 % (ref 12.3–15.4)
WBC: 6.2 10*3/uL (ref 3.4–10.8)

## 2018-03-31 LAB — COMPREHENSIVE METABOLIC PANEL
ALK PHOS: 63 IU/L (ref 39–117)
ALT: 10 IU/L (ref 0–32)
AST: 20 IU/L (ref 0–40)
Albumin/Globulin Ratio: 1.9 (ref 1.2–2.2)
Albumin: 4.5 g/dL (ref 3.5–5.5)
BILIRUBIN TOTAL: 0.6 mg/dL (ref 0.0–1.2)
BUN/Creatinine Ratio: 13 (ref 9–23)
BUN: 12 mg/dL (ref 6–24)
CHLORIDE: 103 mmol/L (ref 96–106)
CO2: 25 mmol/L (ref 20–29)
Calcium: 9.7 mg/dL (ref 8.7–10.2)
Creatinine, Ser: 0.89 mg/dL (ref 0.57–1.00)
GFR calc Af Amer: 89 mL/min/{1.73_m2} (ref >59)
GFR calc non Af Amer: 77 mL/min/{1.73_m2} (ref >59)
GLUCOSE: 79 mg/dL (ref 65–99)
Globulin, Total: 2.4 g/dL (ref 1.5–4.5)
Potassium: 4.6 mmol/L (ref 3.5–5.2)
Sodium: 143 mmol/L (ref 134–144)
Total Protein: 6.9 g/dL (ref 6.0–8.5)

## 2018-03-31 LAB — FOLLICLE STIMULATING HORMONE: FSH: 105.5 m[IU]/mL

## 2018-03-31 LAB — LIPID PANEL
Chol/HDL Ratio: 2.5 ratio (ref 0.0–4.4)
Cholesterol, Total: 155 mg/dL (ref 100–199)
HDL: 61 mg/dL (ref >39)
LDL Calculated: 78 mg/dL (ref 0–99)
Triglycerides: 78 mg/dL (ref 0–149)
VLDL Cholesterol Cal: 16 mg/dL (ref 5–40)

## 2018-03-31 LAB — TSH: TSH: 1.45 u[IU]/mL (ref 0.450–4.500)

## 2018-03-31 LAB — VITAMIN D 25 HYDROXY (VIT D DEFICIENCY, FRACTURES): VIT D 25 HYDROXY: 25.3 ng/mL — AB (ref 30.0–100.0)

## 2018-04-02 LAB — CYTOLOGY - PAP
Diagnosis: NEGATIVE
HPV: NOT DETECTED

## 2018-04-03 ENCOUNTER — Telehealth: Payer: Self-pay | Admitting: *Deleted

## 2018-04-03 DIAGNOSIS — E559 Vitamin D deficiency, unspecified: Secondary | ICD-10-CM

## 2018-04-03 NOTE — Telephone Encounter (Signed)
-----   Message from Regina Eck, CNM sent at 04/03/2018  8:09 AM EDT ----- Pap smear reviewed negative HPV not detected 02

## 2018-04-03 NOTE — Telephone Encounter (Signed)
Message left to return call to Lyle Niblett at [phone].    

## 2018-04-03 NOTE — Telephone Encounter (Signed)
-----   Message from Regina Eck, CNM sent at 04/01/2018  4:58 PM EDT ----- [facility] does show menopausal range now

## 2018-04-03 NOTE — Telephone Encounter (Signed)
Patient returned call. All results reviewed with patient and she verbalized understanding. 3 month lab recheck scheduled for Monday 07-02-18 at [facility]. Patient agreeable to date and time of appointment. Future order placed for lab work. Patient has aex scheduled for 04-11-19.   Routing to provider for final review. Patient agreeable to disposition. Will close encounter.

## 2018-04-03 NOTE — Telephone Encounter (Signed)
-----   Message from Regina Eck, CNM sent at 04/01/2018  4:57 PM EDT ----- Notify patient her TSH is normal Lipid panel with normal cholesterol and good HDL, very good profile Liver, kidney and glucose profile is normal CBC is normal , no anemia Vitamin D is low at 25.3, start on 1000 IU Vitamin D 3 daily and recheck in 3 months, please schedule

## 2018-04-25 NOTE — Progress Notes (Signed)
Sarah Welch was seen today in the movement disorders clinic for neurologic consultation at the request of [place], [place], *.  The consultation is for the evaluation of Parkinsons disease.  Patient has also previously seen Dr. Linus Mako.  I have reviewed records from Dr. Linus Mako and Dr. Jannifer Franklin.  Patient has had sx's since 2012.  First sx's were L sided tremor.  She was started on levodopa in 2015.  She noticed an immediate benefit.  She attempted pramipexole in 2016.  She did not take it long.  She felt "bloated and achy" on pramipexole and did not want to take the medication and discontinued it.  She was last seen in June, 2018 by Dr. Jannifer Franklin.  She tried Azilect at that time.  She noticed no benefit and had difficulty affording the medication and it was discontinued.  She is currently taking carbidopa/levodopa 25/100, 1 tablet 5 times per day (6am/9am/12pm/3pm/5pm).  If she gets them in this way, she will note no wearing off.  If she is late, she will note heaviness of the L arm.   12/07/17 update: The patient is seen today in follow-up.  She is on carbidopa/levodopa 25/100, 1 tablet 5 times per day (6 AM/9 AM/noon/3 PM/5 PM).  Pt denies falls.  Pt denies lightheadedness, near syncope.  No hallucinations.  Mood has been good.  On gabapentin for RLS.  Working well.  She actually stopped taking it for a few nights b/c was taking other meds (abx) and she did pretty well.  The records that were made available to me were reviewed.  Saw her primary care PA on 11/17/17 and saw Dr. Deborra Medina for otitis media on 11/20/17.  She is doing better in that regard.  She joined a gym since last visit and was faithful with this before she got her otitis.   04/27/18 update: Patient is seen today in follow-up for Parkinson's disease.  The patient is on carbidopa/levodopa 25/100, 1 tablet 5 times per day (6am/9am/noon/3pm/5pm).  Will sometimes take an extra. She reports that the big toe is drawn in the AM but once she takes the  medication, it resolves.  I did ask her to come today off of medication so she held it this morning.  She states that she is more stiff and slow and the L hand feels tremulous.  she does have restless leg, but is not sure that she needed gabapentin any longer.  I told her to just take it as needed.  Today, she states that she is still taking it at night.  She has had no falls.  No lightheadedness or near syncope.  No hallucinations.  Records are reviewed since our last visit.  She was seen by primary care on March 27, 2018 and treated for urinary tract infection.  Pt denies falls.  Pt denies lightheadedness, near syncope.  No hallucinations.  Mood has been good.    PREVIOUS MEDICATIONS: Sinemet, Mirapex and azilect (costly)  ALLERGIES:   Allergies  Allergen Reactions  . Ciprofloxacin Other (See Comments)    SEVERE JOINT PAIN  . Pramipexole Other (See Comments)    Could not get up for 24 hours    CURRENT MEDICATIONS:  Outpatient Encounter Medications as of 04/27/2018  Medication Sig  . carbidopa-levodopa (SINEMET IR) 25-100 MG tablet Take 1 tablet by mouth 5 (five) times daily.  . citalopram (CELEXA) 20 MG tablet Take 1 tablet by mouth daily.  Marland Kitchen gabapentin (NEURONTIN) 300 MG capsule Take 600 mg by mouth  at bedtime.   . [DISCONTINUED] carbidopa-levodopa (SINEMET IR) 25-100 MG tablet Take 1 tablet by mouth 5 (five) times daily.   No facility-administered encounter medications on file as of 04/27/2018.     PAST MEDICAL HISTORY:   Past Medical History:  Diagnosis Date  . Abnormal Pap smear of cervix 1990    unsure abnormality, colpo OK  . Parkinson disease ([place]) 12/15    PAST SURGICAL HISTORY:   Past Surgical History:  Procedure Laterality Date  . AUGMENTATION MAMMAPLASTY Bilateral 2004   implants, saline  . COLPOSCOPY    . DILATION AND CURETTAGE OF UTERUS  1992   following SAB    SOCIAL HISTORY:   Social History   Socioeconomic History  . Marital status: Married    Spouse name:  Not on file  . Number of children: 1  . Years of education: Not on file  . Highest education level: Not on file  Occupational History  . 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 Financial resource strain: Not on file  . Food insecurity:    Worry: Not on file    Inability: Not on file  . Transportation needs:    Medical: Not on file    Non-medical: Not on file  Tobacco Use  . Smoking status: Former Smoker    Packs/day: 0.50    Types: Cigarettes    Last attempt to quit: 07/04/2016    Years since quitting: 1.8  . Smokeless tobacco: Never Used  Substance and Sexual Activity  . Alcohol use: No    Alcohol/week: 0.0 standard drinks  . Drug use: No  . Sexual activity: Yes    Partners: Male    Birth control/protection: None  Lifestyle  . Physical activity:    Days per week: Not on file    Minutes per session: Not on file  . Stress: Not on file  Relationships  . Social connections:    Talks on phone: Not on file    Gets together: Not on file    Attends religious service: Not on file    Active member of club or organization: Not on file    Attends meetings of clubs or organizations: Not on file    Relationship status: Not on file  . Intimate partner violence:    Fear of current or ex partner: Not on file    Emotionally abused: Not on file    Physically abused: Not on file    Forced sexual activity: Not on file  Other Topics Concern  . Not on file  Social History Narrative  . Not on file    FAMILY HISTORY:   Family Status  Relation Name Status  . Mother  Alive  . MGM  Deceased  . MGF  Deceased  . PGM  Deceased       stroke  . PGF  Deceased       MI  . Father  Alive  . Sister  Alive  . Daughter  Alive    ROS:  Review of Systems  Constitutional: Positive for weight loss (lost 10 lbs with exercise).  HENT: Negative.   Musculoskeletal: Positive for neck pain (had neck pain/muscle spasm for about a  month but has now resolved).  Skin: Negative.   Neurological: Negative.   Endo/Heme/Allergies: Negative.   Psychiatric/Behavioral: Negative.     PHYSICAL EXAMINATION:    VITALS:   Vitals:  04/27/18 0752  BP: 120/78  Pulse: 85  SpO2: 98%  Weight: 156 lb 4 oz (70.9 kg)  Height: 5' 4.75" (1.645 m)   Wt Readings from Last 3 Encounters:  04/27/18 156 lb 4 oz (70.9 kg)  03/30/18 158 lb (71.7 kg)  12/07/17 167 lb (75.8 kg)     Physical Exam  Constitutional: She is oriented to person, place, and time. She appears well-developed and well-nourished.  HENT:  Head: Normocephalic and atraumatic.  Eyes: EOM are normal.  Neck: Normal range of motion.  Cardiovascular: Normal rate and regular rhythm.  Pulmonary/Chest: Effort normal and breath sounds normal.  Neurological: She is alert and oriented to person, place, and time.  Skin: Skin is warm and dry.  Psychiatric: Judgment normal.    Movement examination: Tone: There is a hint of rigidity in the left upper extremity, only with activation procedures.  Tongue also is normal. Abnormal movements: Mild tremor of the left thumb with distraction procedures. Coordination:  There is no decremation, with any form of RAMS, including alternating supination and pronation of the forearm, hand opening and closing, finger taps, heel taps and toe taps. Gait and Station: The patient has no difficulty arising out of a deep-seated chair without the use of the hands. The patient's stride length is normal.  She has exaggerated arm swing on the right and slightly decreased on the left.  She runs down the hall with symmetric arm swing.  The patient has a negative pull test.      Labs:  Lab Results  Component Value Date   WBC 6.2 03/30/2018   HGB 14.5 03/30/2018   HCT 43.0 03/30/2018   MCV 92 03/30/2018   PLT 238 03/30/2018   Lab Results  Component Value Date   TSH 1.450 03/30/2018     Chemistry      Component Value Date/Time   NA 143  03/30/2018 1142   K 4.6 03/30/2018 1142   CL 103 03/30/2018 1142   CO2 25 03/30/2018 1142   BUN 12 03/30/2018 1142   CREATININE 0.89 03/30/2018 1142   CREATININE 0.77 04/02/2015 0932      Component Value Date/Time   CALCIUM 9.7 03/30/2018 1142   ALKPHOS 63 03/30/2018 1142   AST 20 03/30/2018 1142   ALT 10 03/30/2018 1142   BILITOT 0.6 03/30/2018 1142        ASSESSMENT/PLAN:  1.  Idiopathic Parkinson's disease.    -She will continue 25/100, 1 tablet 5 times per day.  She takes an extra as needed.  This is rare.  -She is having some dystonia of the right great toe when she wakes up in the morning.  Offered extended release carbidopa/levodopa at night.  She wants to hold on that for now.  -Encouraged the patient and told her that I am really proud of her.  She is faithful with exercise with the thrive group and has lost 10 pounds doing so.  She feels better.  She is gaining muscle.  -Prognosis, safety, pathophysiology discussed.  2.  RLS  -She wants to continue the gabapentin, 300 mg, 2 tablets at night.  I really have no objection to that.  If we ever decide to add nighttime/bedtime levodopa, this may correct this symptom as well.  3.  Follow-up in 5 months, sooner should new neurologic issues arise.   Cc:  [place], [place], [place]

## 2018-04-27 ENCOUNTER — Encounter: Payer: Self-pay | Admitting: Neurology

## 2018-04-27 ENCOUNTER — Ambulatory Visit: Payer: Federal, State, Local not specified - PPO | Admitting: Neurology

## 2018-04-27 VITALS — BP 120/78 | HR 85 | Ht 64.75 in | Wt 156.2 lb

## 2018-04-27 DIAGNOSIS — G20 Parkinson's disease: Secondary | ICD-10-CM | POA: Diagnosis not present

## 2018-04-27 MED ORDER — CARBIDOPA-LEVODOPA 25-100 MG PO TABS: 1.0000 | ORAL_TABLET | Freq: Every day | ORAL | 1 refills | Status: DC

## 2018-05-02 ENCOUNTER — Telehealth: Payer: Self-pay

## 2018-05-02 DIAGNOSIS — R110 Nausea: Secondary | ICD-10-CM

## 2018-05-02 MED ORDER — PROMETHAZINE HCL 25 MG PO TABS: 25.0000 mg | ORAL_TABLET | Freq: Three times a day (TID) | ORAL | 0 refills | Status: DC | PRN

## 2018-05-02 NOTE — Telephone Encounter (Signed)
Patient is complaining of dizziness and nausea. Patient's granddaughter was sick with a stomach bug and patient has been in contact with her.

## 2018-05-02 NOTE — Telephone Encounter (Signed)
rx for phenergan sent to pharm.

## 2018-05-29 ENCOUNTER — Telehealth: Payer: Self-pay | Admitting: Certified Nurse Midwife

## 2018-05-29 NOTE — Telephone Encounter (Signed)
Spoke with patient. Advised new IFob kit will be placed at front office for pick up. Patient verbalizes understanding.  Routing to provider for final review. Patient is agreeable to disposition. Will close encounter.

## 2018-05-29 NOTE — Telephone Encounter (Signed)
Patient called and requested a replacement stool test. She said she lost her one she was given at her last visit on 03/30/18.

## 2018-06-17 DIAGNOSIS — Z1211 Encounter for screening for malignant neoplasm of colon: Secondary | ICD-10-CM | POA: Diagnosis not present

## 2018-06-19 ENCOUNTER — Telehealth: Payer: Self-pay | Admitting: Certified Nurse Midwife

## 2018-06-19 DIAGNOSIS — E559 Vitamin D deficiency, unspecified: Secondary | ICD-10-CM

## 2018-06-19 NOTE — Telephone Encounter (Signed)
Patient sent the following message through [facility]. Routing to triage to assist patient with request.   I have a lab appt scheduled on 07/02/18. 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 Will it be ok to have my blood drawn at my work site?  If so, just make sure the lab order is put in as future.    Thanks,  Sarah Welch

## 2018-06-20 NOTE — Telephone Encounter (Signed)
Call to patient. Advised patient okay for patient to have lab drawn at [facility] site. 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 RN advised future order had been updated with [facility] as resulting agency. Patient agreeable. Will cancel appointment for 07-02-18 at [facility].   Routing to provider and will close encounter.

## 2018-06-23 LAB — FECAL OCCULT BLOOD, IMMUNOCHEMICAL: Fecal Occult Bld: NEGATIVE

## 2018-07-02 ENCOUNTER — Other Ambulatory Visit: Payer: Federal, State, Local not specified - PPO

## 2018-07-11 DIAGNOSIS — F419 Anxiety disorder, unspecified: Secondary | ICD-10-CM | POA: Diagnosis not present

## 2018-08-10 ENCOUNTER — Other Ambulatory Visit: Payer: Federal, State, Local not specified - PPO

## 2018-08-10 DIAGNOSIS — R1030 Lower abdominal pain, unspecified: Secondary | ICD-10-CM

## 2018-08-11 LAB — URINE CULTURE
MICRO NUMBER: 91411318
RESULT: NO GROWTH
SPECIMEN QUALITY: ADEQUATE

## 2018-08-15 ENCOUNTER — Encounter: Payer: Self-pay | Admitting: Family Medicine

## 2018-08-20 MED ORDER — GABAPENTIN 300 MG PO CAPS: 600.0000 mg | ORAL_CAPSULE | Freq: Every day | ORAL | 1 refills | Status: DC

## 2018-09-25 NOTE — Progress Notes (Signed)
Sarah Welch was seen today in the movement disorders clinic for neurologic consultation at the request of Sarah Welch, Sarah Welch, *.  The consultation is for the evaluation of Parkinsons disease.  Patient has also previously seen Dr. Linus Welch.  I have reviewed records from Dr. Linus Welch and Dr. Jannifer Welch.  Patient has had sx's since 2012.  First sx's were L sided tremor.  She was started on levodopa in 2015.  She noticed an immediate benefit.  She attempted pramipexole in 2016.  She did not take it long.  She felt "bloated and achy" on pramipexole and did not want to take the medication and discontinued it.  She was last seen in June, 2018 by Dr. Jannifer Welch.  She tried Azilect at that time.  She noticed no benefit and had difficulty affording the medication and it was discontinued.  She is currently taking carbidopa/levodopa 25/100, 1 tablet 5 times per day (6am/9am/12pm/3pm/5pm).  If she gets them in this way, she will note no wearing off.  If she is late, she will note heaviness of the L arm.   12/07/17 update: The patient is seen today in follow-up.  She is on carbidopa/levodopa 25/100, 1 tablet 5 times per day (6 AM/9 AM/noon/3 PM/5 PM).  Pt denies falls.  Pt denies lightheadedness, near syncope.  No hallucinations.  Mood has been good.  On gabapentin for RLS.  Working well.  She actually stopped taking it for a few nights b/c was taking other meds (abx) and she did pretty well.  The records that were made available to me were reviewed.  Saw her primary care PA on 11/17/17 and saw Dr. Deborra Welch for otitis media on 11/20/17.  She is doing better in that regard.  She joined a gym since last visit and was faithful with this before she got her otitis.   04/27/18 update: Patient is seen today in follow-up for Parkinson's disease.  The patient is on carbidopa/levodopa 25/100, 1 tablet 5 times per day (6am/9am/noon/3pm/5pm).  Will sometimes take an extra. She reports that the big toe is drawn in the AM but once she takes the  medication, it resolves.  I did ask her to come today off of medication so she held it this morning.  She states that she is more stiff and slow and the L hand feels tremulous.  she does have restless leg, but is not sure that she needed gabapentin any longer.  I told her to just take it as needed.  Today, she states that she is still taking it at night.  She has had no falls.  No lightheadedness or near syncope.  No hallucinations.  Records are reviewed since our last visit.  She was seen by primary care on March 27, 2018 and treated for urinary tract infection.  Pt denies falls.  Pt denies lightheadedness, near syncope.  No hallucinations.  Mood has been good.    09/28/18 update: Patient is seen today in follow-up for Parkinson's disease.  Patient is on carbidopa/levodopa 25/100, 1 tablet 5 times per day.  She has had no falls since our last visit.  No lightheadedness or near syncope.  She takes 600 mg at night of gabapentin for the restless leg but it isn't working well.    Notes trouble sleeping.  Going to sleep okay but awaking in the middle of the night and cannot sleep.  She also has leg cramps in the middle of the night (and some in the day) but they are independent  of the trouble sleeping.    PREVIOUS MEDICATIONS: Sinemet, Mirapex and azilect (costly)  ALLERGIES:   Allergies  Allergen Reactions  . Ciprofloxacin Other (See Comments)    SEVERE JOINT PAIN  . Pramipexole Other (See Comments)    Could not get up for 24 hours    CURRENT MEDICATIONS:  Outpatient Encounter Medications as of 09/28/2018  Medication Sig  . carbidopa-levodopa (SINEMET IR) 25-100 MG tablet Take 1 tablet by mouth 5 (five) times daily.  . citalopram (CELEXA) 20 MG tablet Take 1 tablet by mouth daily.  Marland Kitchen gabapentin (NEURONTIN) 300 MG capsule Take 2 capsules (600 mg total) by mouth at bedtime.  . promethazine (PHENERGAN) 25 MG tablet Take 1 tablet (25 mg total) by mouth every 8 (eight) hours as needed for nausea or  vomiting.   No facility-administered encounter medications on file as of 09/28/2018.     PAST MEDICAL HISTORY:   Past Medical History:  Diagnosis Date  . Abnormal Pap smear of cervix 1990    unsure abnormality, colpo OK  . Parkinson disease ([place]) 12/15    PAST SURGICAL HISTORY:   Past Surgical History:  Procedure Laterality Date  . AUGMENTATION MAMMAPLASTY Bilateral 2004   implants, saline  . COLPOSCOPY    . DILATION AND CURETTAGE OF UTERUS  1992   following SAB    SOCIAL HISTORY:   Social History   Socioeconomic History  . Marital status: Married    Spouse name: Not on file  . Number of children: 1  . Years of education: Not on file  . Highest education level: Not on file  Occupational History  . 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 Financial resource strain: Not on file  . Food insecurity:    Worry: Not on file    Inability: Not on file  . Transportation needs:    Medical: Not on file    Non-medical: Not on file  Tobacco Use  . Smoking status: Former Smoker    Packs/day: 0.50    Types: Cigarettes    Last attempt to quit: 07/04/2016    Years since quitting: 2.2  . Smokeless tobacco: Never Used  Substance and Sexual Activity  . Alcohol use: No    Alcohol/week: 0.0 standard drinks  . Drug use: No  . Sexual activity: Yes    Partners: Male    Birth control/protection: None  Lifestyle  . Physical activity:    Days per week: Not on file    Minutes per session: Not on file  . Stress: Not on file  Relationships  . Social connections:    Talks on phone: Not on file    Gets together: Not on file    Attends religious service: Not on file    Active member of club or organization: Not on file    Attends meetings of clubs or organizations: Not on file    Relationship status: Not on file  . Intimate partner violence:    Fear of current or ex partner: Not on file    Emotionally abused: Not on  file    Physically abused: Not on file    Forced sexual activity: Not on file  Other Topics Concern  . Not on file  Social History Narrative  . Not on file    FAMILY HISTORY:   Family Status  Relation Name Status  . Mother  Alive  . MGM  Deceased  . MGF  Deceased  . PGM  Deceased       stroke  . PGF  Deceased       MI  . Father  Alive  . Sister  Alive  . Daughter  Alive    ROS:  Review of Systems  Constitutional: Positive for weight loss (with exercise).  HENT: Negative.   Eyes: Negative.   Respiratory: Negative.   Cardiovascular: Negative.   Gastrointestinal: Negative.   Musculoskeletal: Positive for myalgias (leg cramps).  Skin: Negative.   Psychiatric/Behavioral: The patient has insomnia.       VITALS:   Vitals:   09/28/18 0740  BP: 126/82  Pulse: 91  SpO2: 98%  Weight: 152 lb (68.9 kg)  Height: 5\' 4"  (1.626 m)   Wt Readings from Last 3 Encounters:  09/28/18 152 lb (68.9 kg)  04/27/18 156 lb 4 oz (70.9 kg)  03/30/18 158 lb (71.7 kg)     Physical Exam  GEN:  The patient appears stated age and is in NAD. HEENT:  Normocephalic, atraumatic.  The mucous membranes are moist. The superficial temporal arteries are without ropiness or tenderness. CV:  RRR Lungs:  CTAB Neck/HEME:  There are no carotid bruits bilaterally.  Neurological examination:  Orientation: The patient is alert and oriented x3. Cranial nerves: There is good facial symmetry. The speech is fluent and clear. Soft palate rises symmetrically and there is no tongue deviation. Hearing is intact to conversational tone. Sensation: Sensation is intact to light touch throughout Motor: Strength is 5/5 in the bilateral upper and lower extremities.   Shoulder shrug is equal and symmetric.  There is no pronator drift.  Movement examination: Tone: There is normal tone in the upper and lower extremities. Abnormal movements: There is mild tremor of the left leg when distracted. Coordination:  There  is no decremation with RAM's, with any form of RAMS, including alternating supination and pronation of the forearm, hand opening and closing, finger taps, heel taps and toe taps. Gait and Station: The patient has no difficulty arising out of a deep-seated chair without the use of the hands. The patient's stride length is normal.   Labs:  Lab Results  Component Value Date   WBC 6.2 03/30/2018   HGB 14.5 03/30/2018   HCT 43.0 03/30/2018   MCV 92 03/30/2018   PLT 238 03/30/2018   Lab Results  Component Value Date   TSH 1.450 03/30/2018     Chemistry      Component Value Date/Time   NA 143 03/30/2018 1142   K 4.6 03/30/2018 1142   CL 103 03/30/2018 1142   CO2 25 03/30/2018 1142   BUN 12 03/30/2018 1142   CREATININE 0.89 03/30/2018 1142   CREATININE 0.77 04/02/2015 0932      Component Value Date/Time   CALCIUM 9.7 03/30/2018 1142   ALKPHOS 63 03/30/2018 1142   AST 20 03/30/2018 1142   ALT 10 03/30/2018 1142   BILITOT 0.6 03/30/2018 1142        ASSESSMENT/PLAN:  1.  Idiopathic Parkinson's disease.    -She will continue 25/100, 1 tablet 5 times per day.  She takes an extra as needed.  This is rare.  -After quite some discussion, we decided to try low-dose Neupro to see if that would help day and nighttime cramping.  We will start with 2 mg for 1 week and then increase to 4 mg daily.  We discussed extensively risks, benefits, and side effects, which included but  were not limited to compulsive behaviors and sleep attacks.  She expressed understanding.  She and I discussed similarities and differences between this and pramipexole, which she had issues with in the past.  2.  RLS  -She does not think that the gabapentin is helping.  I do think that the Neupro will help with this.  We will wean the gabapentin in 3 weeks, after she is up on the Neupro patch.  3.  Insomnia  -I do not want to change too many things at once today.  If the above does not help her sleeping, then  certainly we can look at something like mirtazapine or clonazepam to help with sleep.  4.  I will see her back in the next 4 months, sooner should new neurologic issues arise.   Cc:  [place], [place], [place]

## 2018-09-28 ENCOUNTER — Ambulatory Visit: Payer: Federal, State, Local not specified - PPO | Admitting: Neurology

## 2018-09-28 ENCOUNTER — Encounter: Payer: Self-pay | Admitting: Neurology

## 2018-09-28 VITALS — BP 126/82 | HR 91 | Ht 64.0 in | Wt 152.0 lb

## 2018-09-28 DIAGNOSIS — G2581 Restless legs syndrome: Secondary | ICD-10-CM | POA: Diagnosis not present

## 2018-09-28 DIAGNOSIS — G20 Parkinson's disease: Secondary | ICD-10-CM | POA: Diagnosis not present

## 2018-09-28 DIAGNOSIS — R252 Cramp and spasm: Secondary | ICD-10-CM

## 2018-09-28 DIAGNOSIS — G4701 Insomnia due to medical condition: Secondary | ICD-10-CM

## 2018-09-28 MED ORDER — ROTIGOTINE 4 MG/24HR TD PT24: 1.0000 | MEDICATED_PATCH | Freq: Every day | TRANSDERMAL | 5 refills | Status: DC

## 2018-09-28 MED ORDER — ROTIGOTINE 4 MG/24HR TD PT24: 1.0000 | MEDICATED_PATCH | Freq: Every day | TRANSDERMAL | 0 refills | Status: DC

## 2018-09-28 MED ORDER — ROTIGOTINE 2 MG/24HR TD PT24: 1.0000 | MEDICATED_PATCH | Freq: Every day | TRANSDERMAL | 0 refills | Status: DC

## 2018-09-28 NOTE — Patient Instructions (Signed)
1. Start Neupro 2 mg for one week, then increase to 4 mg daily.   2. In 3 weeks, decrease Gabapentin to 300 mg once daily for one week, then stop medication.

## 2018-10-08 ENCOUNTER — Other Ambulatory Visit: Payer: Self-pay | Admitting: Neurology

## 2018-10-08 MED ORDER — CARBIDOPA-LEVODOPA ER 25-100 MG PO TBCR: 1.0000 | EXTENDED_RELEASE_TABLET | Freq: Every day | ORAL | 1 refills | Status: DC

## 2018-10-08 MED ORDER — CARBIDOPA-LEVODOPA ER 25-100 MG PO TBCR: 1.0000 | EXTENDED_RELEASE_TABLET | Freq: Every day | ORAL | 0 refills | Status: DC

## 2018-10-19 ENCOUNTER — Other Ambulatory Visit: Payer: Self-pay

## 2018-10-19 ENCOUNTER — Encounter: Payer: Self-pay | Admitting: Certified Nurse Midwife

## 2018-10-19 ENCOUNTER — Ambulatory Visit (INDEPENDENT_AMBULATORY_CARE_PROVIDER_SITE_OTHER): Payer: Federal, State, Local not specified - PPO | Admitting: Certified Nurse Midwife

## 2018-10-19 VITALS — BP 116/76 | HR 68 | Resp 16 | Wt 150.0 lb

## 2018-10-19 DIAGNOSIS — E559 Vitamin D deficiency, unspecified: Secondary | ICD-10-CM | POA: Diagnosis not present

## 2018-10-19 DIAGNOSIS — N898 Other specified noninflammatory disorders of vagina: Secondary | ICD-10-CM | POA: Diagnosis not present

## 2018-10-19 DIAGNOSIS — N952 Postmenopausal atrophic vaginitis: Secondary | ICD-10-CM | POA: Diagnosis not present

## 2018-10-19 DIAGNOSIS — R102 Pelvic and perineal pain: Secondary | ICD-10-CM

## 2018-10-19 NOTE — Progress Notes (Signed)
48 y.o. Married Caucasian female G2P1011 here with complaint of vaginal symptoms of dryness with sexual activity and pain with insertion. Menopausal no HRT. Has tried different positions and no change. Has tried OTC lubricant and no change. Describes discharge as scant to none.Menopausal no HRT.Denies no vaginal bleeding with intercourse. Onset of symptoms 2 months ago. Denies new personal products. No other issues today. Vitamin D due fo recheck also. No STD concerns. Urinary symptoms none .   Review of Systems  Constitutional: Negative.   HENT: Negative.   Eyes: Negative.   Respiratory: Negative.   Cardiovascular: Negative.   Gastrointestinal: Negative.   Genitourinary: Negative.  Negative for frequency and urgency.       Vaginal dryness  Musculoskeletal: Negative.   Skin: Negative.   Endo/Heme/Allergies: Negative.     O:Healthy female WDWN Affect: normal, orientation x 3  Exam:Skin: warm and dry Abdomen:Soft, non tender  Inguinal Lymph nodes: no enlargement or tenderness Pelvic exam: External genital: normal female, no lesions, no atrophy noted BUS: negative Vagina: atrophic appearance with scant white discharge, tender with some redness, no odor Affirm taken Cervix: normal, non tender, no CMT Uterus: normal, non tender Adnexa:normal, non tender, no masses or fullness noted    A:Normal pelvic exam Atrophic vaginitis R/O vaginal infection Vitamin D deficiency   P:Discussed findings of atrophic vaginitis related to menopause and etiology. Coconut Oil use discussed for vaginal dryness nightly and avoid intercourse until has been using for at least 2 weeks. Instructions given for use. Patient agreeable to use. Does not want estrogen unless no other solution.  Lab: Affirm will treat if indicated, Vitamin D  Rv 2-3 weeks

## 2018-10-19 NOTE — Patient Instructions (Signed)
Atrophic Vaginitis    Atrophic vaginitis is a condition in which the tissues that line the vagina become dry and thin. This condition is most common in women who have stopped having regular menstrual periods (are in menopause). This usually starts when a woman is 45-49 years old. That is the time when a woman's estrogen levels begin to drop (decrease).  Estrogen is a female hormone. It helps to keep the tissues of the vagina moist. It stimulates the vagina to produce a clear fluid that lubricates the vagina for sexual intercourse. This fluid also protects the vagina from infection. Lack of estrogen can cause the lining of the vagina to get thinner and dryer. The vagina may also shrink in size. It may become less elastic. Atrophic vaginitis tends to get worse over time as a woman's estrogen level drops.  What are the causes?  This condition is caused by the normal drop in estrogen that happens around the time of menopause.  What increases the risk?  Certain conditions or situations may lower a woman's estrogen level, leading to a higher risk for atrophic vaginitis. You are more likely to develop this condition if:   You are taking medicines that block estrogen.   You have had your ovaries removed.   You are being treated for cancer with X-ray (radiation) or medicines (chemotherapy).   You have given birth or are breastfeeding.   You are older than age 50.   You smoke.  What are the signs or symptoms?  Symptoms of this condition include:   Pain, soreness, or bleeding during sexual intercourse (dyspareunia).   Vaginal burning, irritation, or itching.   Pain or bleeding when a speculum is used in a vaginal exam (pelvic exam).   Having burning pain when passing urine.   Vaginal discharge that is brown or yellow.  In some cases, there are no symptoms.  How is this diagnosed?  This condition is diagnosed by taking a medical history and doing a physical exam. This will include a pelvic exam that checks the  vaginal tissues. Though rare, you may also have other tests, including:   A urine test.   A test that checks the acid balance in your vagina (acid balance test).  How is this treated?  Treatment for this condition depends on how severe your symptoms are. Treatment may include:   Using an over-the-counter vaginal lubricant before sex.   Using a long-acting vaginal moisturizer.   Using low-dose vaginal estrogen for moderate to severe symptoms that do not respond to other treatments. Options include creams, tablets, and inserts (vaginal rings). Before you use a vaginal estrogen, tell your health care provider if you have a history of:  ? Breast cancer.  ? Endometrial cancer.  ? Blood clots.  If you are not sexually active and your symptoms are very mild, you may not need treatment.  Follow these instructions at home:  Medicines   Take over-the-counter and prescription medicines only as told by your health care provider. Do not use herbal or alternative medicines unless your health care provider says that you can.   Use over-the-counter creams, lubricants, or moisturizers for dryness only as directed by your health care provider.  General instructions   If your atrophic vaginitis is caused by menopause, discuss all of your menopause symptoms and treatment options with your health care provider.   Do not douche.   Do not use products that can make your vagina dry. These include:  ? Scented feminine sprays.  ?   Scented tampons.  ? Scented soaps.   Vaginal intercourse can help to improve blood flow and elasticity of vaginal tissue. If it hurts to have sex, try using a lubricant or moisturizer just before having intercourse.  Contact a health care provider if:   Your discharge looks different than normal.   Your vagina has an unusual smell.   You have new symptoms.   Your symptoms do not improve with treatment.   Your symptoms get worse.  Summary   Atrophic vaginitis is a condition in which the tissues that  line the vagina become dry and thin. It is most common in women who have stopped having regular menstrual periods (are in menopause).   Treatment options include using vaginal lubricants and low-dose vaginal estrogen.   Contact a health care provider if your vagina has an unusual smell, or if your symptoms get worse or do not improve after treatment.  This information is not intended to replace advice given to you by your health care provider. Make sure you discuss any questions you have with your health care provider.  Document Released: 01/20/2015 Document Revised: 06/01/2017 Document Reviewed: 06/01/2017  Elsevier Interactive Patient Education  2019 [employer]

## 2018-10-20 LAB — VAGINITIS/VAGINOSIS, DNA PROBE
Candida Species: NEGATIVE
Gardnerella vaginalis: NEGATIVE
Trichomonas vaginosis: NEGATIVE

## 2018-10-20 LAB — VITAMIN D 25 HYDROXY (VIT D DEFICIENCY, FRACTURES): Vit D, 25-Hydroxy: 31.1 ng/mL (ref 30.0–100.0)

## 2018-10-31 ENCOUNTER — Telehealth: Payer: Self-pay | Admitting: Certified Nurse Midwife

## 2018-10-31 NOTE — Telephone Encounter (Signed)
If working well and no pain with sexual activity or bleeding. Do not need to recheck.

## 2018-10-31 NOTE — Telephone Encounter (Signed)
You wanted me to let you know how the coconut oil worked, it seems to be very helpful.  I also started the Vit D 3 times per day.   Do I still need the follow up appt with you on the 26th?    Have a great afternoon,    Daun

## 2018-10-31 NOTE — Telephone Encounter (Signed)
Sarah Welch, CNM -see patient message below and advise?

## 2018-11-01 NOTE — Telephone Encounter (Signed)
Call to patient. Message given to patient as seen below from Debbi. Patient denies pain with intercourse or bleeding. States the coconut oil "worked Social worker advised would send message with update to Debbi and cancel appointment on 11-14-2018. Advised to return call to the office if symptoms return. Patient agreeable.   Routing to provider and will close encounter.

## 2018-11-09 ENCOUNTER — Ambulatory Visit: Payer: Federal, State, Local not specified - PPO | Admitting: Certified Nurse Midwife

## 2018-11-13 MED ORDER — CARBIDOPA-LEVODOPA 25-100 MG PO TABS: 1.0000 | ORAL_TABLET | Freq: Every day | ORAL | 1 refills | Status: DC

## 2018-11-14 ENCOUNTER — Ambulatory Visit: Payer: Federal, State, Local not specified - PPO | Admitting: Certified Nurse Midwife

## 2018-12-19 ENCOUNTER — Other Ambulatory Visit: Payer: Self-pay

## 2018-12-19 MED ORDER — CARBIDOPA-LEVODOPA ER 25-100 MG PO TBCR: 1.0000 | EXTENDED_RELEASE_TABLET | Freq: Every day | ORAL | 1 refills | Status: DC

## 2019-01-29 NOTE — Progress Notes (Signed)
Virtual Visit via Video Note The purpose of this virtual visit is to provide medical care while limiting exposure to the novel coronavirus.    Consent was obtained for video visit:  Yes.   Answered questions that patient had about telehealth interaction:  Yes.   I discussed the limitations, risks, security and privacy concerns of performing an evaluation and management service by telemedicine. I also discussed with the patient that there may be a patient responsible charge related to this service. The patient expressed understanding and agreed to proceed.  Pt location: Home Physician Location: home Name of referring provider:  Belva Bertin, [place], * I connected with Sarah Welch at patients initiation/request on 01/30/2019 at  9:30 AM EDT by video enabled telemedicine application and verified that I am speaking with the correct person using two identifiers. Pt MRN:  [id number] Pt DOB:  [birth year] Video Participants:  Sarah Welch;     History of Present Illness:  Patient is seen today in follow-up for Parkinson's disease.  She is generally on carbidopa/levodopa 25/100, 1 tablet at 3:30 AM, 5:30 AM, 8 AM, 11 AM, 2 PM, 4 PM and then takes carbidopa/levodopa 25/100 CR at bedtime.  This was added since our last visit for cramping.   That worked well.  Because of the pandemic, she is working 1 week on and one-week off, and generally does not need as much medication when she is not working, so often she will just take 5 of the immediate release tablets during the day instead of 6.  We had tried the rotigotine patch since our last visit, but she felt dizzy, nauseated and what she described as emotional.  She could not tolerate it.  She states that the extended release is working fairly well at bedtime.  In regards to her insomnia, she states that  She is sleeping well.  she has had no falls since our last visit.  No lightheadedness or near syncope.  She has been seeing gynecology for vaginal  dryness but has found that coconut oil has worked well for that.  She is menopausal.  She is still meeting with some of the [employer] group to social distance exercise 2 days per week.     Previous medications: Levodopa, pramipexole, rasagiline (costly), Neupro 2 mg (dizzy, nauseated, "emotional")  Observations/Objective:   Vitals:   01/30/19 0929  Weight: 146 lb (66.2 kg)  Height: 5\' 3"  (1.6 m)   GEN:  The patient appears stated age and is in NAD.  Neurological examination:  Orientation: The patient is alert and oriented x3. Cranial nerves: There is good facial symmetry. There is nofacial hypomimia.  The speech is fluent and clear. Soft palate rises symmetrically and there is no tongue deviation. Hearing is intact to conversational tone. Motor: Strength is at least antigravity x 4.   Shoulder shrug is equal and symmetric.  There is no pronator drift.  Movement examination: Tone: unable Abnormal movements: none Coordination:  There is no decremation with RAM's, with any form of RAMS, including alternating supination and pronation of the forearm, hand opening and closing, finger taps Gait and Station: The patient has no difficulty arising out of a deep-seated chair without the use of the hands. The patient's stride length is good.      Assessment and Plan:   1.  Parkinson's disease  -She will take carbidopa/levodopa 25/100, 1 tablet 6 times per day.  She takes these at 3:30 AM, 5:30 AM, 8 AM, 11 AM, 2 PM, 4 PM,  when she is working, but when she is off she often times only take 5 tablets/day.  -She will continue carbidopa/levodopa 25/100 CR at bedtime for the cramping.  -Congratulated her on continuing the social distance exercising with her [employer] group.  2.  Restless leg  -She found that the additional carbidopa/levodopa 25/100 CR at bedtime helped.  In addition, she starts her daytime levodopa quite early (almost in the middle of the night), but she also starts working very early in  the day and that is why she starts medications early.  3.  Insomnia  -This has resolved.  Interestingly, it got much better when her sleep pattern changed during the pandemic and she was not working so much.  Follow Up Instructions:  Follow-up 4-5 months  -I discussed the assessment and treatment plan with the patient. The patient was provided an opportunity to ask questions and all were answered. The patient agreed with the plan and demonstrated an understanding of the instructions.   The patient was advised to call back or seek an in-person evaluation if the symptoms worsen or if the condition fails to improve as anticipated.    Total Time spent in visit with the patient was:  15 min.  Pt understands and agrees with the plan of care outlined.     Alonza Bogus, DO

## 2019-01-30 ENCOUNTER — Encounter: Payer: Self-pay | Admitting: *Deleted

## 2019-01-30 ENCOUNTER — Other Ambulatory Visit: Payer: Self-pay

## 2019-01-30 ENCOUNTER — Encounter: Payer: Self-pay | Admitting: Neurology

## 2019-01-30 ENCOUNTER — Telehealth (INDEPENDENT_AMBULATORY_CARE_PROVIDER_SITE_OTHER): Payer: Federal, State, Local not specified - PPO | Admitting: Neurology

## 2019-01-30 DIAGNOSIS — G20 Parkinson's disease: Secondary | ICD-10-CM

## 2019-02-02 ENCOUNTER — Other Ambulatory Visit: Payer: Self-pay | Admitting: Neurology

## 2019-02-08 ENCOUNTER — Ambulatory Visit: Payer: Federal, State, Local not specified - PPO | Admitting: Neurology

## 2019-03-15 ENCOUNTER — Encounter: Payer: Self-pay | Admitting: Family Medicine

## 2019-03-15 ENCOUNTER — Ambulatory Visit: Payer: Federal, State, Local not specified - PPO | Admitting: Family Medicine

## 2019-03-15 VITALS — BP 130/84 | HR 70 | Temp 98.1°F | Ht 65.0 in | Wt 150.0 lb

## 2019-03-15 DIAGNOSIS — Z7689 Persons encountering health services in other specified circumstances: Secondary | ICD-10-CM | POA: Diagnosis not present

## 2019-03-15 DIAGNOSIS — M25512 Pain in left shoulder: Secondary | ICD-10-CM | POA: Diagnosis not present

## 2019-03-15 MED ORDER — IBUPROFEN 800 MG PO TABS: 800.0000 mg | ORAL_TABLET | Freq: Two times a day (BID) | ORAL | 0 refills | Status: DC | PRN

## 2019-03-15 NOTE — Patient Instructions (Signed)
Use ice pack x 10 min then heating pad x 10 min Take ibuprofen 800mg  1 tab twice per day with food x 7 days

## 2019-03-15 NOTE — Progress Notes (Signed)
Sarah Welch is a 48 y.o. female  Chief Complaint  Patient presents with  . Establish Care    est care/ left shoulder pain (crossing her arms across hurt) left sided neck pai    HPI: Sarah Welch is a 48 y.o. female here to establish care with our office. She complains of Lt lateral neck pain that is chronic in nature and then newer onset Lt shoulder pain that started about 1 mo ago after pt slept on her left side in a recliner all night. She woke up with Lt hip and Lt shoulder pain. She states the hip pain resolved in about 1-2 weeks but the shoulder pain persists.  She has not done or taken anything for it. She denies numbness or tingling in LUE. She denies weakness in LUE. Her motion is normal but she notes pain when she reaches across her body with her Lt arm. Some nights she can lay/sleep on her Lt side and other times she cannot due to discomfort. Pain is a dull ache/throbbing. No radiation of pain. Symptoms are not interfering with her day to day functioning.   Specialists: neuro (Dr. Carles Collet) - Parkinson's; GYN Melvia Heaps)  Last PAP: 03/2018 Last mammo: 02/2018 - due  Med refills needed today: none   Past Medical History:  Diagnosis Date  . Abnormal Pap smear of cervix 1990    unsure abnormality, colpo OK  . Parkinson disease ([place]) 12/15    Past Surgical History:  Procedure Laterality Date  . AUGMENTATION MAMMAPLASTY Bilateral 2004   implants, saline  . COLPOSCOPY    . DILATION AND CURETTAGE OF UTERUS  1992   following SAB    Social History   Socioeconomic History  . Marital status: Married    Spouse name: Not on file  . Number of children: 1  . Years of education: Not on file  . Highest education level: Not on file  Occupational History  . 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 Financial resource strain: Not on file  . Food insecurity    Worry: Not on file    Inability: Not on file   . Transportation needs    Medical: Not on file    Non-medical: Not on file  Tobacco Use  . Smoking status: Former Smoker    Packs/day: 0.50    Types: Cigarettes    Quit date: 07/04/2016    Years since quitting: 2.6  . Smokeless tobacco: Never Used  Substance and Sexual Activity  . Alcohol use: No    Alcohol/week: 0.0 standard drinks  . Drug use: No  . Sexual activity: Yes    Partners: Male    Birth control/protection: Post-menopausal  Lifestyle  . Physical activity    Days per week: Not on file    Minutes per session: Not on file  . Stress: Not on file  Relationships  . Social Herbalist on phone: Not on file    Gets together: Not on file    Attends religious service: Not on file    Active member of club or organization: Not on file    Attends meetings of clubs or organizations: Not on file    Relationship status: Not on file  . Intimate partner violence    Fear of current or ex partner: Not on file    Emotionally abused: Not on file    Physically abused:  Not on file    Forced sexual activity: Not on file  Other Topics Concern  . Not on file  Social History Narrative  . Not on file    Family History  Problem Relation Age of Onset  . Heart disease Mother   . Diabetes Maternal Grandmother   . Hypertension Maternal Grandmother   . Diabetes Maternal Grandfather   . Hypertension Maternal Grandfather   . Stroke Paternal Grandmother   . Heart attack Paternal Grandfather      Immunization History  Administered Date(s) Administered  . Influenza-Unspecified 06/22/2016, 06/02/2017  . Tdap 09/20/2007    Outpatient Encounter Medications as of 03/15/2019  Medication Sig Note  . carbidopa-levodopa (SINEMET IR) 25-100 MG tablet Take 1 tablet by mouth 6 (six) times daily.   . Carbidopa-Levodopa ER (SINEMET CR) 25-100 MG tablet controlled release Take 1 tablet by mouth at bedtime.   . citalopram (CELEXA) 20 MG tablet Take 1 tablet by mouth daily. 03/27/2014:  Received from: External Pharmacy Received Sig:   . ibuprofen (ADVIL) 800 MG tablet Take 1 tablet (800 mg total) by mouth 2 (two) times daily as needed.    No facility-administered encounter medications on file as of 03/15/2019.      ROS: Pertinent positives and negatives noted in HPI. Remainder of ROS non-contributory  Allergies  Allergen Reactions  . Ciprofloxacin Other (See Comments)    SEVERE JOINT PAIN  . Pramipexole Other (See Comments)    Could not get up for 24 hours    BP 130/84   Pulse 70   Temp 98.1 F (36.7 C) (Oral)   Ht 5\' 5"  (1.651 m)   Wt 150 lb (68 kg)   LMP 01/14/2016 (Exact Date)   SpO2 99%   BMI 24.96 kg/m   Physical Exam  Constitutional: She is oriented to person, place, and time. She appears well-developed and well-nourished. No distress.  Neck: Normal range of motion.  Musculoskeletal:        General: No deformity or edema.     Left shoulder: She exhibits decreased range of motion (normal ROM in all directions except slight restriction when pt reaches across her body w/ LUE) and tenderness. She exhibits no swelling, no spasm and normal strength.       Arms:  Neurological: She is alert and oriented to person, place, and time. She has normal reflexes. She exhibits normal muscle tone. Coordination normal.  Psychiatric: She has a normal mood and affect. Her behavior is normal.     A/P:  1. Encounter to establish care with new doctor  2. Acute pain of left shoulder - pain x 1 mo since sleeping on Lt side in recliner overnight and therefore shoulder joint compressed for 6-7hrs - ice then heat BID - ibuprofen 800mg  BID w food x 7-10 days - stretching/ROM exercises daily - f/u in 2 wks if no/minimal improvement Discussed plan and reviewed medications with patient, including risks, benefits, and potential side effects. Pt expressed understand. All questions answered.

## 2019-04-11 ENCOUNTER — Ambulatory Visit: Payer: Federal, State, Local not specified - PPO | Admitting: Certified Nurse Midwife

## 2019-04-12 ENCOUNTER — Ambulatory Visit: Payer: Federal, State, Local not specified - PPO | Admitting: Certified Nurse Midwife

## 2019-05-06 ENCOUNTER — Telehealth: Payer: Self-pay | Admitting: Certified Nurse Midwife

## 2019-05-06 ENCOUNTER — Other Ambulatory Visit: Payer: Self-pay

## 2019-05-06 MED ORDER — CARBIDOPA-LEVODOPA 25-100 MG PO TABS: 1.0000 | ORAL_TABLET | Freq: Every day | ORAL | 0 refills | Status: DC

## 2019-05-06 NOTE — Telephone Encounter (Signed)
Requested Prescriptions   Pending Prescriptions Disp Refills  . carbidopa-levodopa (SINEMET IR) 25-100 MG tablet 540 tablet 1    Sig: Take 1 tablet by mouth 6 (six) times daily.   Rx last filled: 11/13/18 #540 1 refills  Pt last seen: 01/30/19  Follow up appt scheduled:05/21/19

## 2019-05-06 NOTE — Telephone Encounter (Signed)
Will call back to reschedule aex after checking her schedule.

## 2019-05-17 NOTE — Progress Notes (Signed)
Sarah Welch was seen today in follow up for Parkinsons disease.  Pt is currently on carbidopa/levodopa 25/100, 1 tablet at 5:30 AM/8 AM/11 AM/1 PM/4 PM/7pm and carbidopa/levodopa 25/100 CR at bedtime for cramping.   Having more evening leg cramps at about 9pm-10pm.  Twice she has tried gabapentin for this.    Pt denies falls.  Pt denies lightheadedness, near syncope.  No hallucinations.  Mood has been good.  Medical records have been reviewed since her last visit.  She has established with a new primary care.  She is frustrated that she put back on weight b/c she couldn't go to [facility] with [facility].     PREVIOUS MEDICATIONS: Sinemet, Mirapex and rasagaline (too expensive)  ALLERGIES:   Allergies  Allergen Reactions  . Ciprofloxacin Other (See Comments)    SEVERE JOINT PAIN  . Pramipexole Other (See Comments)    Could not get up for 24 hours    CURRENT MEDICATIONS:  Outpatient Encounter Medications as of 05/21/2019  Medication Sig  . carbidopa-levodopa (SINEMET IR) 25-100 MG tablet Take 1 tablet by mouth 6 (six) times daily.  . Carbidopa-Levodopa ER (SINEMET CR) 25-100 MG tablet controlled release Take 1 tablet by mouth at bedtime.  . citalopram (CELEXA) 20 MG tablet Take 1 tablet by mouth daily.  Marland Kitchen ibuprofen (ADVIL) 800 MG tablet Take 1 tablet (800 mg total) by mouth 2 (two) times daily as needed.   No facility-administered encounter medications on file as of 05/21/2019.     PAST MEDICAL HISTORY:   Past Medical History:  Diagnosis Date  . Abnormal Pap smear of cervix 1990    unsure abnormality, colpo OK  . Parkinson disease ([place]) 12/15    PAST SURGICAL HISTORY:   Past Surgical History:  Procedure Laterality Date  . AUGMENTATION MAMMAPLASTY Bilateral 2004   implants, saline  . COLPOSCOPY    . DILATION AND CURETTAGE OF UTERUS  1992   following SAB    SOCIAL HISTORY:   Social History   Socioeconomic History  . Marital status: Married    Spouse name: Not on file  .  Number of children: 1  . Years of education: Not on file  . Highest education level: Associate degree: academic program  Occupational History  . 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 Financial resource strain: Not on file  . Food insecurity    Worry: Not on file    Inability: Not on file  . Transportation needs    Medical: Not on file    Non-medical: Not on file  Tobacco Use  . Smoking status: Former Smoker    Packs/day: 0.50    Types: Cigarettes    Quit date: 07/04/2016    Years since quitting: 2.8  . Smokeless tobacco: Never Used  Substance and Sexual Activity  . Alcohol use: No    Alcohol/week: 0.0 standard drinks  . Drug use: No  . Sexual activity: Yes    Partners: Male    Birth control/protection: Post-menopausal  Lifestyle  . Physical activity    Days per week: Not on file    Minutes per session: Not on file  . Stress: Not on file  Relationships  . Social Herbalist on phone: Not on file    Gets together: Not on file    Attends religious service: Not on file    Active member of club or  organization: Not on file    Attends meetings of clubs or organizations: Not on file    Relationship status: Not on file  . Intimate partner violence    Fear of current or ex partner: Not on file    Emotionally abused: Not on file    Physically abused: Not on file    Forced sexual activity: Not on file  Other Topics Concern  . Not on file  Social History Narrative  . Not on file    FAMILY HISTORY:   Family Status  Relation Name Status  . Mother  Alive  . MGM  Deceased  . MGF  Deceased  . PGM  Deceased       stroke  . PGF  Deceased       MI  . Father  Alive  . Sister  Alive  . Daughter  Alive    ROS:  Review of Systems  Constitutional: Negative.   HENT: Negative.   Eyes: Negative.   Respiratory: Negative.   Cardiovascular: Negative.   Gastrointestinal: Negative.   Genitourinary:  Negative.   Musculoskeletal: Negative.   Skin: Negative.     PHYSICAL EXAMINATION:    VITALS:   Vitals:   05/21/19 0829  BP: 138/89  Pulse: (!) 106  SpO2: 97%  Weight: 153 lb 12.8 oz (69.8 kg)  Height: 5' 4"  (1.626 m)    GEN:  The patient appears stated age and is in NAD. HEENT:  Normocephalic, atraumatic.  The mucous membranes are moist. The superficial temporal arteries are without ropiness or tenderness. CV:  Tachy.  regular Lungs:  CTAB Neck/HEME:  There are no carotid bruits bilaterally.  Neurological examination:  Orientation: The patient is alert and oriented x3. Cranial nerves: There is good facial symmetry with no facial hypomimia. The speech is fluent and clear. Soft palate rises symmetrically and there is no tongue deviation. Hearing is intact to conversational tone. Sensation: Sensation is intact to light touch throughout Motor: Strength is at least antigravity x4.  Movement examination: Tone: There is normal tone in the UE/LE Abnormal movements: there is mild dyskinesia in the L leg with distraction only Coordination:  There is mild decremation with RAM's, with any form of RAMS, including alternating supination and pronation of the forearm, hand opening and closing, finger taps, heel taps and toe taps on the L only Gait and Station: The patient has no difficulty arising out of a deep-seated chair without the use of the hands. The patient's stride length is good with good arm swing.     ASSESSMENT/PLAN:  1.  Parkinson's disease             -She will take carbidopa/levodopa 25/100, 1 tablet 6 times per day.  She takes these at 3:30 AM, 5:30 AM, 8 AM, 11 AM, 2 PM, 4 PM, when she is working, but when she is off she often times only take 5 tablets/day.             -She will d/c carbidopa/levodopa 25/100 CR at bedtime for the cramping and start carbidopa/levodopa 50/200 at 8:30pm for cramping and rls.             -she is frustrated that [facility] is not meeting right  now.  Met with my social worker to see if we can find alternatives to safely restart this group  2.  Restless leg             -starting carbidopa/levodopa 50/200 at bedtime  3.  Follow up is anticipated in the next 4-6 months, sooner should new neurologic issues arise.  Much greater than 50% of this visit was spent in counseling and coordinating care.  Total face to face time:  25 min  Cc:  Ronnald Nian, DO

## 2019-05-21 ENCOUNTER — Other Ambulatory Visit: Payer: Self-pay

## 2019-05-21 ENCOUNTER — Encounter: Payer: Self-pay | Admitting: Neurology

## 2019-05-21 ENCOUNTER — Ambulatory Visit: Payer: Federal, State, Local not specified - PPO | Admitting: Neurology

## 2019-05-21 VITALS — BP 138/89 | HR 106 | Ht 64.0 in | Wt 153.8 lb

## 2019-05-21 DIAGNOSIS — G20 Parkinson's disease: Secondary | ICD-10-CM | POA: Diagnosis not present

## 2019-05-21 DIAGNOSIS — G2581 Restless legs syndrome: Secondary | ICD-10-CM

## 2019-05-21 MED ORDER — CARBIDOPA-LEVODOPA ER 50-200 MG PO TBCR: 1.0000 | EXTENDED_RELEASE_TABLET | Freq: Two times a day (BID) | ORAL | 1 refills | Status: DC

## 2019-05-21 NOTE — Patient Instructions (Signed)
Continue carbidopa/levodopa 25/100 IR (yellow pills) 6 times per day STOP carbidopa/levodopa 25/100 CR at bedtime START carbidopa/levodopa 50/200 at about 8:30 pm for the cramping and restless leg

## 2019-05-24 ENCOUNTER — Ambulatory Visit: Payer: Federal, State, Local not specified - PPO | Admitting: Certified Nurse Midwife

## 2019-06-06 ENCOUNTER — Ambulatory Visit: Payer: Federal, State, Local not specified - PPO | Admitting: Neurology

## 2019-07-05 ENCOUNTER — Ambulatory Visit: Payer: Federal, State, Local not specified - PPO | Admitting: Neurology

## 2019-07-17 ENCOUNTER — Ambulatory Visit: Payer: Federal, State, Local not specified - PPO | Admitting: Family Medicine

## 2019-07-17 ENCOUNTER — Encounter: Payer: Self-pay | Admitting: Family Medicine

## 2019-07-17 ENCOUNTER — Other Ambulatory Visit: Payer: Self-pay

## 2019-07-17 VITALS — BP 120/70 | HR 80 | Ht 64.0 in | Wt 157.0 lb

## 2019-07-17 DIAGNOSIS — F419 Anxiety disorder, unspecified: Secondary | ICD-10-CM | POA: Diagnosis not present

## 2019-07-17 MED ORDER — CITALOPRAM HYDROBROMIDE 20 MG PO TABS: 20.0000 mg | ORAL_TABLET | Freq: Every day | ORAL | 3 refills | Status: DC

## 2019-07-17 NOTE — Patient Instructions (Signed)
Melatonin (natrol) 3-5mg  30-86min before bed

## 2019-07-17 NOTE — Progress Notes (Signed)
Sarah Welch is a 48 y.o. female  No chief complaint on file.   HPI: *Sarah Welch is a 48 y.o. female requesting refill of her citalopram. She has been on this med since 2012 and it has been effective. She denies any side effects. Symptoms well controlled. Appetite is good. Sleep is ok/baseline for pt.  No issues or concerns.   Past Medical History:  Diagnosis Date  . Abnormal Pap smear of cervix 1990    unsure abnormality, colpo OK  . Parkinson disease ([place]) 12/15    Past Surgical History:  Procedure Laterality Date  . AUGMENTATION MAMMAPLASTY Bilateral 2004   implants, saline  . COLPOSCOPY    . DILATION AND CURETTAGE OF UTERUS  1992   following SAB    Social History   Socioeconomic History  . Marital status: Married    Spouse name: Not on file  . Number of children: 1  . Years of education: Not on file  . Highest education level: Associate degree: academic program  Occupational History  . 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 Financial resource strain: Not on file  . Food insecurity    Worry: Not on file    Inability: Not on file  . Transportation needs    Medical: Not on file    Non-medical: Not on file  Tobacco Use  . Smoking status: Former Smoker    Packs/day: 0.50    Types: Cigarettes    Quit date: 07/04/2016    Years since quitting: 3.0  . Smokeless tobacco: Never Used  Substance and Sexual Activity  . Alcohol use: No    Alcohol/week: 0.0 standard drinks  . Drug use: No  . Sexual activity: Yes    Partners: Male    Birth control/protection: Post-menopausal  Lifestyle  . Physical activity    Days per week: Not on file    Minutes per session: Not on file  . Stress: Not on file  Relationships  . Social Herbalist on phone: Not on file    Gets together: Not on file    Attends religious service: Not on file    Active member of club or organization: Not on file     Attends meetings of clubs or organizations: Not on file    Relationship status: Not on file  . Intimate partner violence    Fear of current or ex partner: Not on file    Emotionally abused: Not on file    Physically abused: Not on file    Forced sexual activity: Not on file  Other Topics Concern  . Not on file  Social History Narrative  . Not on file    Family History  Problem Relation Age of Onset  . Heart disease Mother   . Diabetes Maternal Grandmother   . Hypertension Maternal Grandmother   . Diabetes Maternal Grandfather   . Hypertension Maternal Grandfather   . Stroke Paternal Grandmother   . Heart attack Paternal Grandfather   . Alcohol abuse Father   . Healthy Sister   . Healthy Daughter      Immunization History  Administered Date(s) Administered  . Influenza-Unspecified 06/22/2016, 06/02/2017  . Tdap 09/20/2007    Outpatient Encounter Medications as of 07/17/2019  Medication Sig Note  . carbidopa-levodopa (SINEMET CR) 50-200 MG tablet Take 1 tablet by mouth 2 (two) times daily.   . carbidopa-levodopa (SINEMET  IR) 25-100 MG tablet Take 1 tablet by mouth 6 (six) times daily.   . citalopram (CELEXA) 20 MG tablet Take 1 tablet by mouth daily. 03/27/2014: Received from: External Pharmacy Received Sig:   . ibuprofen (ADVIL) 800 MG tablet Take 1 tablet (800 mg total) by mouth 2 (two) times daily as needed.    No facility-administered encounter medications on file as of 07/17/2019.      ROS: Gen: no fever, chills  Skin: no rash, itching Resp: no cough, wheeze,SOB CV: no CP, palpitations, LE edema,  GI: no heartburn, n/v/d/c, abd pain GU: no dysuria, urgency, frequency, hematuria  MSK: no joint pain, myalgias, back pain Neuro: no dizziness, headache, weakness    Allergies  Allergen Reactions  . Ciprofloxacin Other (See Comments)    SEVERE JOINT PAIN  . Pramipexole Other (See Comments)    Could not get up for 24 hours    BP 120/70   Pulse 80   Ht 5'  4" (1.626 m)   Wt 157 lb (71.2 kg)   LMP 01/14/2016 (Exact Date)   SpO2 99%   BMI 26.95 kg/m   Physical Exam  Constitutional: She is oriented to person, place, and time. She appears well-developed and well-nourished. No distress.  Cardiovascular: Normal rate and regular rhythm.  Pulmonary/Chest: Effort normal and breath sounds normal. No respiratory distress.  Neurological: She is alert and oriented to person, place, and time.  Psychiatric: She has a normal mood and affect. Her behavior is normal. Judgment and thought content normal.     A/P:  1. Anxiety - stable, well-controlled x years Refill: - citalopram (CELEXA) 20 MG tablet; Take 1 tablet (20 mg total) by mouth daily.  Dispense: 90 tablet; Refill: 3 - f/u in 1 year or sooner PRN Discussed plan and reviewed medications with patient, including risks, benefits, and potential side effects. Pt expressed understand. All questions answered.

## 2019-07-23 ENCOUNTER — Other Ambulatory Visit: Payer: Self-pay

## 2019-07-23 MED ORDER — CARBIDOPA-LEVODOPA 25-100 MG PO TABS: 1.0000 | ORAL_TABLET | Freq: Every day | ORAL | 0 refills | Status: DC

## 2019-08-12 ENCOUNTER — Other Ambulatory Visit: Payer: Self-pay

## 2019-08-13 NOTE — Progress Notes (Signed)
48 y.o. G22P1011 Married  Caucasian Fe here for annual exam. Menopausal still having occasional hot flash and night sweats.Denies vaginal bleeding. Vaginal dryness better when she uses coconut oil. Parkinson's disease no changes except toe cramps.Has decreased work out. Sees Neurology for Parkinson's management. Sees PCP for labs, aex and Celexa management. No other health issues today.  Patient's last menstrual period was 01/14/2016 (exact date).          Sexually active: Yes.    The current method of family planning is post menopausal status.    Exercising: Yes.    twice a week Smoker:  no  Review of Systems  Constitutional: Negative.   HENT: Negative.   Eyes: Negative.   Respiratory: Negative.   Cardiovascular: Negative.   Gastrointestinal: Negative.   Genitourinary: Negative.   Musculoskeletal: Negative.   Skin: Negative.   Neurological: Negative.   Endo/Heme/Allergies: Negative.   Psychiatric/Behavioral: Negative.     Health Maintenance: Pap:  04-02-15 neg, 03-30-18 neg HPV HR neg History of Abnormal Pap: yes MMG:  02-21-18 category b density birads 2:neg, scheduled for today Self Breast exams: no Colonoscopy:  none BMD:   none TDaP:  2009 Shingles: no Pneumonia: no Hep C and HIV: hep c neg 2017 Labs: yes   reports that she quit smoking about 3 years ago. Her smoking use included cigarettes. She smoked 0.50 packs per day. She has never used smokeless tobacco. She reports that she does not drink alcohol or use drugs.  Past Medical History:  Diagnosis Date  . Abnormal Pap smear of cervix 1990    unsure abnormality, colpo OK  . Parkinson disease ([place]) 12/15    Past Surgical History:  Procedure Laterality Date  . AUGMENTATION MAMMAPLASTY Bilateral 2004   implants, saline  . COLPOSCOPY    . DILATION AND CURETTAGE OF UTERUS  1992   following SAB    Current Outpatient Medications  Medication Sig Dispense Refill  . carbidopa-levodopa (SINEMET CR) 50-200 MG tablet Take  1 tablet by mouth 2 (two) times daily. 90 tablet 1  . carbidopa-levodopa (SINEMET IR) 25-100 MG tablet Take 1 tablet by mouth 6 (six) times daily. 540 tablet 0  . citalopram (CELEXA) 20 MG tablet Take 1 tablet (20 mg total) by mouth daily. 90 tablet 3  . ibuprofen (ADVIL) 800 MG tablet Take 1 tablet (800 mg total) by mouth 2 (two) times daily as needed. 60 tablet 0   No current facility-administered medications for this visit.     Family History  Problem Relation Age of Onset  . Heart disease Mother   . Diabetes Maternal Grandmother   . Hypertension Maternal Grandmother   . Diabetes Maternal Grandfather   . Hypertension Maternal Grandfather   . Stroke Paternal Grandmother   . Heart attack Paternal Grandfather   . Alcohol abuse Father   . Healthy Sister   . Healthy Daughter     ROS:  Pertinent items are noted in HPI.  Otherwise, a comprehensive ROS was negative.  Exam:   BP 120/80   Pulse 70   Temp (!) 97.1 F (36.2 C) (Skin)   Resp 16   Ht 5' 4.75" (1.645 m)   Wt 157 lb (71.2 kg)   LMP 01/14/2016 (Exact Date)   BMI 26.33 kg/m  Height: 5' 4.75" (164.5 cm) Ht Readings from Last 3 Encounters:  08/14/19 5' 4.75" (1.645 m)  07/17/19 5\' 4"  (1.626 m)  05/21/19 5\' 4"  (1.626 m)    General appearance: alert, cooperative and  appears stated age Head: Normocephalic, without obvious abnormality, atraumatic Neck: no adenopathy, supple, symmetrical, trachea midline and thyroid normal to inspection and palpation Lungs: clear to auscultation bilaterally Breasts: normal appearance, no masses or tenderness, No nipple retraction or dimpling, No nipple discharge or bleeding, No axillary or supraclavicular adenopathy Heart: regular rate and rhythm Abdomen: soft, non-tender; no masses,  no organomegaly Extremities: extremities normal, atraumatic, no cyanosis or edema Skin: Skin color, texture, turgor normal. No rashes or lesions Lymph nodes: Cervical, supraclavicular, and axillary nodes  normal. No abnormal inguinal nodes palpated Neurologic: Grossly normal   Pelvic: External genitalia:  no lesions              Urethra:  normal appearing urethra with no masses, tenderness or lesions              Bartholin's and Skene's: normal                 Vagina: normal appearing vagina with normal color and discharge, no lesions              Cervix: no cervical motion tenderness, no lesions and normal appearance              Pap taken: No. Bimanual Exam:  Uterus:  normal size, contour, position, consistency, mobility, non-tender              Adnexa: normal adnexa and no mass, fullness, tenderness               Rectovaginal: Confirms               Anus:  normal sphincter tone, no lesions  Chaperone present: yes  A:  Well Woman with normal exam  Menopausal no HRT  Parkinson's Disease with neurology, stable present  PCP management of anxiety stable medication  Immunization due TDAP  Screening labs  Colonoscopy due  P:   Reviewed health and wellness pertinent to exam  Aware of need to advise if vaginal bleeding  Continue follow up as indicated with MD's regarding health issues  Patient will check with neurology regarding TDAP use and advise  Labs: Lipid panel, CBC, TSH, Vitamin D, CMP  Patient will be called with appointment and referral information to schedule  Pap smear: no  counseled on breast self exam, mammography screening, feminine hygiene, adequate intake of calcium and vitamin D, diet and exercise  return annually or prn  An After Visit Summary was printed and given to the patient.

## 2019-08-14 ENCOUNTER — Other Ambulatory Visit: Payer: Self-pay

## 2019-08-14 ENCOUNTER — Encounter: Payer: Self-pay | Admitting: Certified Nurse Midwife

## 2019-08-14 ENCOUNTER — Ambulatory Visit (INDEPENDENT_AMBULATORY_CARE_PROVIDER_SITE_OTHER): Payer: Federal, State, Local not specified - PPO | Admitting: Certified Nurse Midwife

## 2019-08-14 VITALS — BP 120/80 | HR 70 | Temp 97.1°F | Resp 16 | Ht 64.75 in | Wt 157.0 lb

## 2019-08-14 DIAGNOSIS — Z01419 Encounter for gynecological examination (general) (routine) without abnormal findings: Secondary | ICD-10-CM

## 2019-08-14 DIAGNOSIS — Z1211 Encounter for screening for malignant neoplasm of colon: Secondary | ICD-10-CM

## 2019-08-14 DIAGNOSIS — Z0000 Encounter for general adult medical examination without abnormal findings: Secondary | ICD-10-CM

## 2019-08-14 DIAGNOSIS — E785 Hyperlipidemia, unspecified: Secondary | ICD-10-CM | POA: Diagnosis not present

## 2019-08-14 DIAGNOSIS — Z1231 Encounter for screening mammogram for malignant neoplasm of breast: Secondary | ICD-10-CM | POA: Diagnosis not present

## 2019-08-14 DIAGNOSIS — E559 Vitamin D deficiency, unspecified: Secondary | ICD-10-CM

## 2019-08-14 NOTE — Patient Instructions (Signed)

## 2019-08-15 LAB — CBC
Hematocrit: 43.7 % (ref 34.0–46.6)
Hemoglobin: 14.6 g/dL (ref 11.1–15.9)
MCH: 31.3 pg (ref 26.6–33.0)
MCHC: 33.4 g/dL (ref 31.5–35.7)
MCV: 94 fL (ref 79–97)
Platelets: 257 10*3/uL (ref 150–450)
RBC: 4.67 x10E6/uL (ref 3.77–5.28)
RDW: 12.1 % (ref 11.7–15.4)
WBC: 6.1 10*3/uL (ref 3.4–10.8)

## 2019-08-15 LAB — COMPREHENSIVE METABOLIC PANEL
ALT: 13 IU/L (ref 0–32)
AST: 21 IU/L (ref 0–40)
Albumin/Globulin Ratio: 1.5 (ref 1.2–2.2)
Albumin: 4.2 g/dL (ref 3.8–4.8)
Alkaline Phosphatase: 67 IU/L (ref 39–117)
BUN/Creatinine Ratio: 17 (ref 9–23)
BUN: 13 mg/dL (ref 6–24)
Bilirubin Total: 0.6 mg/dL (ref 0.0–1.2)
CO2: 26 mmol/L (ref 20–29)
Calcium: 9.5 mg/dL (ref 8.7–10.2)
Chloride: 102 mmol/L (ref 96–106)
Creatinine, Ser: 0.77 mg/dL (ref 0.57–1.00)
GFR calc Af Amer: 105 mL/min/{1.73_m2} (ref >59)
GFR calc non Af Amer: 91 mL/min/{1.73_m2} (ref >59)
Globulin, Total: 2.8 g/dL (ref 1.5–4.5)
Glucose: 88 mg/dL (ref 65–99)
Potassium: 4.1 mmol/L (ref 3.5–5.2)
Sodium: 141 mmol/L (ref 134–144)
Total Protein: 7 g/dL (ref 6.0–8.5)

## 2019-08-15 LAB — LIPID PANEL
Chol/HDL Ratio: 2.5 ratio (ref 0.0–4.4)
Cholesterol, Total: 178 mg/dL (ref 100–199)
HDL: 71 mg/dL (ref >39)
LDL Chol Calc (NIH): 94 mg/dL (ref 0–99)
Triglycerides: 66 mg/dL (ref 0–149)
VLDL Cholesterol Cal: 13 mg/dL (ref 5–40)

## 2019-08-15 LAB — TSH: TSH: 0.783 u[IU]/mL (ref 0.450–4.500)

## 2019-08-15 LAB — VITAMIN D 25 HYDROXY (VIT D DEFICIENCY, FRACTURES): Vit D, 25-Hydroxy: 22.5 ng/mL — ABNORMAL LOW (ref 30.0–100.0)

## 2019-08-18 ENCOUNTER — Other Ambulatory Visit: Payer: Self-pay | Admitting: Certified Nurse Midwife

## 2019-08-18 DIAGNOSIS — E559 Vitamin D deficiency, unspecified: Secondary | ICD-10-CM

## 2019-08-19 ENCOUNTER — Other Ambulatory Visit: Payer: Self-pay

## 2019-08-19 MED ORDER — VITAMIN D (ERGOCALCIFEROL) 1.25 MG (50000 UNIT) PO CAPS: 50000.0000 [IU] | ORAL_CAPSULE | ORAL | 0 refills | Status: DC

## 2019-08-26 ENCOUNTER — Other Ambulatory Visit: Payer: Self-pay

## 2019-08-26 MED ORDER — CARBIDOPA-LEVODOPA ER 50-200 MG PO TBCR: 1.0000 | EXTENDED_RELEASE_TABLET | Freq: Two times a day (BID) | ORAL | 1 refills | Status: DC

## 2019-08-27 DIAGNOSIS — K5903 Drug induced constipation: Secondary | ICD-10-CM | POA: Diagnosis not present

## 2019-08-27 DIAGNOSIS — Z1211 Encounter for screening for malignant neoplasm of colon: Secondary | ICD-10-CM | POA: Diagnosis not present

## 2019-09-27 ENCOUNTER — Ambulatory Visit: Payer: Federal, State, Local not specified - PPO | Admitting: Family Medicine

## 2019-09-27 ENCOUNTER — Encounter: Payer: Self-pay | Admitting: Family Medicine

## 2019-09-27 ENCOUNTER — Other Ambulatory Visit: Payer: Self-pay

## 2019-09-27 VITALS — BP 110/82 | HR 83 | Temp 96.2°F | Ht 64.75 in | Wt 161.4 lb

## 2019-09-27 DIAGNOSIS — F430 Acute stress reaction: Secondary | ICD-10-CM | POA: Diagnosis not present

## 2019-09-27 DIAGNOSIS — F410 Panic disorder [episodic paroxysmal anxiety] without agoraphobia: Secondary | ICD-10-CM | POA: Diagnosis not present

## 2019-09-27 DIAGNOSIS — R4589 Other symptoms and signs involving emotional state: Secondary | ICD-10-CM

## 2019-09-27 DIAGNOSIS — F419 Anxiety disorder, unspecified: Secondary | ICD-10-CM

## 2019-09-27 MED ORDER — DIAZEPAM 5 MG PO TABS: 5.0000 mg | ORAL_TABLET | Freq: Four times a day (QID) | ORAL | 0 refills | Status: DC | PRN

## 2019-09-27 MED ORDER — ESCITALOPRAM OXALATE 10 MG PO TABS: 10.0000 mg | ORAL_TABLET | Freq: Every day | ORAL | 3 refills | Status: DC

## 2019-09-27 NOTE — Progress Notes (Signed)
Sarah Welch is a 49 y.o. female  Chief Complaint  Patient presents with  . Anxiety    Pt c/o that anxiety seems to be happening more offten, hard to breath, heart has been racing, feeling dizzy at time.    HPI: Sarah Welch is a 49 y.o. female here to f/u on worsening anxiety symptoms and panic attacks - 4 in the past 3 wks. The first was on 12/18 and then 2 most recent were 2 days ago and then last night. Pt cannot identify specific trigger or issue that could be causing increased symptoms.  She has been on celexa 20mg  x 8-9 years. In the past few weeks, she increased celexa to 40mg  daily with no improvement in symptoms.  Pt notes having episodes of chest tightness, shortness of breath, some lightheadedness. No CP, n/v, diaphoresis, headache, vision changes. She can feel symptoms coming on/building. BP and HR have been checked during at least 2 of the episodes and all normal. She has taken valium during episode and symptoms improve in 15 min or so then resolve within 39min or so.  She has been able to exercise with trainer without issue 2x/wk.  She endorses fatigue x months, not sleeping well. She paints as a hobby but has not been motivated to do so in recent weeks. She wants to do this and things with husband but feels stuck and like she would rahter just go to bed/sleep.  She has Parkinson's but no recent med changes and no noticeable worsening of symptoms.   Depression screen [facility] 2/9 09/27/2019  Decreased Interest 3  Down, Depressed, Hopeless 0  PHQ - 2 Score 3  Altered sleeping 2  Tired, decreased energy 3  Change in appetite 0  Feeling bad or failure about yourself  0  Trouble concentrating 0  Moving slowly or fidgety/restless 0  Suicidal thoughts 0  PHQ-9 Score 8  Difficult doing work/chores Very difficult     Past Medical History:  Diagnosis Date  . Abnormal Pap smear of cervix 1990    unsure abnormality, colpo OK  . Parkinson disease ([place]) 12/15    Past Surgical  History:  Procedure Laterality Date  . AUGMENTATION MAMMAPLASTY Bilateral 2004   implants, saline  . COLPOSCOPY    . DILATION AND CURETTAGE OF UTERUS  1992   following SAB    Social History   Socioeconomic History  . Marital status: Married    Spouse name: Not on file  . Number of children: 1  . Years of education: Not on file  . Highest education level: Associate degree: academic program  Occupational History  . 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 Smoking status: Former Smoker    Packs/day: 0.50    Types: Cigarettes    Quit date: 07/04/2016    Years since quitting: 3.2  . Smokeless tobacco: Never Used  Substance and Sexual Activity  . Alcohol use: No    Alcohol/week: 0.0 standard drinks  . Drug use: No  . Sexual activity: Yes    Partners: Male    Birth control/protection: Post-menopausal  Other Topics Concern  . Not on file  Social History Narrative  . Not on file   Social Determinants of Health   Financial Resource Strain:   . Difficulty of Paying Living Expenses: Not on file  Food Insecurity:   . Worried About Charity fundraiser in the Last  Year: Not on file  . Ran Out of Food in the Last Year: Not on file  Transportation Needs:   . Lack of Transportation (Medical): Not on file  . Lack of Transportation (Non-Medical): Not on file  Physical Activity:   . Days of Exercise per Week: Not on file  . Minutes of Exercise per Session: Not on file  Stress:   . Feeling of Stress : Not on file  Social Connections:   . Frequency of Communication with Friends and Family: Not on file  . Frequency of Social Gatherings with Friends and Family: Not on file  . Attends Religious Services: Not on file  . Active Member of Clubs or Organizations: Not on file  . Attends Archivist Meetings: Not on file  . Marital Status: Not on file  Intimate Partner Violence:   . Fear of Current or Ex-Partner:  Not on file  . Emotionally Abused: Not on file  . Physically Abused: Not on file  . Sexually Abused: Not on file    Family History  Problem Relation Age of Onset  . Heart disease Mother   . Diabetes Maternal Grandmother   . Hypertension Maternal Grandmother   . Diabetes Maternal Grandfather   . Hypertension Maternal Grandfather   . Stroke Paternal Grandmother   . Heart attack Paternal Grandfather   . Alcohol abuse Father   . Healthy Sister   . Healthy Daughter      Immunization History  Administered Date(s) Administered  . Influenza-Unspecified 06/22/2016, 06/02/2017  . Tdap 09/20/2007    Outpatient Encounter Medications as of 09/27/2019  Medication Sig  . carbidopa-levodopa (SINEMET CR) 50-200 MG tablet Take 1 tablet by mouth 2 (two) times daily.  . carbidopa-levodopa (SINEMET IR) 25-100 MG tablet Take 1 tablet by mouth 6 (six) times daily.  . citalopram (CELEXA) 20 MG tablet Take 1 tablet (20 mg total) by mouth daily. (Patient taking differently: Take 20 mg by mouth daily. Patient has been taking 2 day.)  . ibuprofen (ADVIL) 800 MG tablet Take 1 tablet (800 mg total) by mouth 2 (two) times daily as needed.  . Vitamin D, Ergocalciferol, (DRISDOL) 1.25 MG (50000 UT) CAPS capsule Take 1 capsule (50,000 Units total) by mouth every 7 (seven) days.   No facility-administered encounter medications on file as of 09/27/2019.     ROS: Pertinent positives and negatives noted in HPI. Remainder of ROS non-contributory    Allergies  Allergen Reactions  . Ciprofloxacin Other (See Comments)    SEVERE JOINT PAIN  . Pramipexole Other (See Comments)    Could not get up for 24 hours    BP 110/82 (BP Location: Left Arm, Patient Position: Sitting, Cuff Size: Normal)   Pulse 83   Temp (!) 96.2 F (35.7 C) (Temporal)   Ht 5' 4.75" (1.645 m)   Wt 161 lb 6.4 oz (73.2 kg)   LMP 01/14/2016 (Exact Date)   SpO2 98%   BMI 27.07 kg/m  BP Readings from Last 3 Encounters:  09/27/19 110/82    08/14/19 120/80  07/17/19 120/70    Physical Exam  Constitutional: She is oriented to person, place, and time. She appears well-developed and well-nourished. No distress.  Neurological: She is alert and oriented to person, place, and time.  Psychiatric: She has a normal mood and affect. Her behavior is normal. Judgment and thought content normal.     A/P:  1. Anxiety 2. Panic attack as reaction to stress 3. Depressed mood - PHQ-9 =  8 - stop celexa - decrease to 20mg  daily x 4-7 days then start lexapro  Rx: - diazepam (VALIUM) 5 MG tablet; Take 1 tablet (5 mg total) by mouth every 6 (six) hours as needed for anxiety.  Dispense: 30 tablet; Refill: 0 - pt to take PRN for panic attack - escitalopram (LEXAPRO) 10 MG tablet; Take 1 tablet (10 mg total) by mouth daily.  Dispense: 90 tablet; Refill: 3 - pt would consider [facility] counseling so I provided list of resources in AVS and advised pt to let me know if she has any issues establishing care with someone - f/u in 2.5-3wks or sooner PRN Discussed plan and reviewed medications with patient, including risks, benefits, and potential side effects. Pt expressed understand. All questions answered.  This visit occurred during the SARS-CoV-2 public health emergency.  Safety protocols were in place, including screening questions prior to the visit, additional usage of staff PPE, and extensive cleaning of exam room while observing appropriate contact time as indicated for disinfecting solutions.

## 2019-09-27 NOTE — Patient Instructions (Addendum)
Health Maintenance Due  Topic Date Due  . HIV Screening  10/19/1984  . TETANUS/TDAP  09/19/2017  . MAMMOGRAM  02/22/2019  . INFLUENZA VACCINE  04/20/2019    No flowsheet data found.  [place]: [email or web address]  [email or web address]  [email or web address] [email or web address]  [facility] [email or web address]  [email or web address]

## 2019-10-11 ENCOUNTER — Encounter: Payer: Self-pay | Admitting: Certified Nurse Midwife

## 2019-10-11 DIAGNOSIS — D122 Benign neoplasm of ascending colon: Secondary | ICD-10-CM | POA: Diagnosis not present

## 2019-10-11 DIAGNOSIS — K5903 Drug induced constipation: Secondary | ICD-10-CM | POA: Diagnosis not present

## 2019-10-11 DIAGNOSIS — Z1211 Encounter for screening for malignant neoplasm of colon: Secondary | ICD-10-CM | POA: Diagnosis not present

## 2019-10-11 DIAGNOSIS — K621 Rectal polyp: Secondary | ICD-10-CM | POA: Diagnosis not present

## 2019-10-11 DIAGNOSIS — D128 Benign neoplasm of rectum: Secondary | ICD-10-CM | POA: Diagnosis not present

## 2019-10-11 DIAGNOSIS — K635 Polyp of colon: Secondary | ICD-10-CM | POA: Diagnosis not present

## 2019-10-22 NOTE — Progress Notes (Signed)
Virtual Visit via Phone Note (tried 2 different video platforms) The purpose of this virtual visit is to provide medical care while limiting exposure to the novel coronavirus.    Consent was obtained for telephone visit:  Yes.   Answered questions that patient had about telephone interaction:  Yes.   I discussed the limitations, risks, security and privacy concerns of performing an evaluation and management service by telephone . I also discussed with the patient that there may be a patient responsible charge related to this service. The patient expressed understanding and agreed to proceed.  Pt location: Home Physician Location: office Name of referring provider:  Ronnald Nian, DO I connected with Worthy Keeler at patients initiation/request on 10/25/2019 at  8:15 AM EST by video enabled telemedicine application and verified that I am speaking with the correct person using two identifiers. Pt MRN:  [id number] Pt DOB:  Nov 28, 1970 Video Participants:  Worthy Keeler;     History of Present Illness:  Patient seen today in follow-up for Parkinson's disease.  Increased her bedtime dose of levodopa last visit for cramping and restless leg.  She reports that she has increased her daytime meds as work stress increased.  As a consequence, she started having foot dystonia or dyskinesia.  No falls since last visit.  No lightheadedness or near syncope.  Medical records have been reviewed since last visit.  Just a few weeks ago, she saw her primary care physician in regards to anxiety and panic attacks.  Her citalopram was decreased.  She is starting on Lexapro.  Valium was given as needed for panic attacks.  That has improved  Current movement d/o meds:  Carbidopa/levodopa 25/100, 1 tablet 6 times per day (has gone from 5-6 per day to 6-7 per day) Carbidopa/levodopa 50/200 at bedtime (started last visit)   Current Outpatient Medications on File Prior to Visit  Medication Sig Dispense Refill    . carbidopa-levodopa (SINEMET CR) 50-200 MG tablet Take 1 tablet by mouth 2 (two) times daily. 120 tablet 1  . carbidopa-levodopa (SINEMET IR) 25-100 MG tablet Take 1 tablet by mouth 6 (six) times daily. 540 tablet 0  . citalopram (CELEXA) 20 MG tablet Take 1 tablet (20 mg total) by mouth daily. (Patient taking differently: Take 20 mg by mouth daily. Patient has been taking 2 day.) 90 tablet 3  . diazepam (VALIUM) 5 MG tablet Take 1 tablet (5 mg total) by mouth every 6 (six) hours as needed for anxiety. 30 tablet 0  . escitalopram (LEXAPRO) 10 MG tablet Take 1 tablet (10 mg total) by mouth daily. 90 tablet 3  . ibuprofen (ADVIL) 800 MG tablet Take 1 tablet (800 mg total) by mouth 2 (two) times daily as needed. 60 tablet 0  . Vitamin D, Ergocalciferol, (DRISDOL) 1.25 MG (50000 UT) CAPS capsule Take 1 capsule (50,000 Units total) by mouth every 7 (seven) days. 12 capsule 0   No current facility-administered medications on file prior to visit.     Observations/Objective:   There were no vitals filed for this visit. She is alert and oriented x 3 Speech is fluent and clear    Assessment and Plan:   1.  Parkinsons Disease  -Continue carbidopa/levodopa 25/100, 1 tablet 6 times per day. She is often taking 7 per day.  Don't want her dose escalating with stress.  Job stress is big deal right now  -Continue carbidopa/levodopa 50/200 at bedtime  -add amantadine 100 mg bid.  R/B/SE were discussed, included livedo  reticularis.  The opportunity to ask questions was given and they were answered to the best of my ability.  The patient expressed understanding and willingness to follow the outlined treatment protocols. 2.  Restless leg  -Uses carbidopa/levodopa 50/200 for this 3.  Generalized anxiety disorder  -Primary care just started Lexapro for this and is weaning her off of citalopram  -refer for counseling.  Pt prefers in person  Follow Up Instructions:    -I discussed the assessment and  treatment plan with the patient. The patient was provided an opportunity to ask questions and all were answered. The patient agreed with the plan and demonstrated an understanding of the instructions.   The patient was advised to call back or seek an in-person evaluation if the symptoms worsen or if the condition fails to improve as anticipated.    Total time: 15 min   Lareina Espino, DO

## 2019-10-24 ENCOUNTER — Encounter: Payer: Self-pay | Admitting: Neurology

## 2019-10-25 ENCOUNTER — Ambulatory Visit (INDEPENDENT_AMBULATORY_CARE_PROVIDER_SITE_OTHER): Payer: Federal, State, Local not specified - PPO | Admitting: Family Medicine

## 2019-10-25 ENCOUNTER — Telehealth (INDEPENDENT_AMBULATORY_CARE_PROVIDER_SITE_OTHER): Payer: Federal, State, Local not specified - PPO | Admitting: Neurology

## 2019-10-25 ENCOUNTER — Encounter: Payer: Self-pay | Admitting: Family Medicine

## 2019-10-25 ENCOUNTER — Other Ambulatory Visit: Payer: Self-pay

## 2019-10-25 VITALS — BP 120/80 | HR 63 | Temp 97.8°F | Ht 64.75 in | Wt 161.2 lb

## 2019-10-25 DIAGNOSIS — F419 Anxiety disorder, unspecified: Secondary | ICD-10-CM

## 2019-10-25 DIAGNOSIS — F330 Major depressive disorder, recurrent, mild: Secondary | ICD-10-CM

## 2019-10-25 DIAGNOSIS — F430 Acute stress reaction: Secondary | ICD-10-CM

## 2019-10-25 DIAGNOSIS — R4589 Other symptoms and signs involving emotional state: Secondary | ICD-10-CM | POA: Diagnosis not present

## 2019-10-25 DIAGNOSIS — F411 Generalized anxiety disorder: Secondary | ICD-10-CM | POA: Diagnosis not present

## 2019-10-25 DIAGNOSIS — F410 Panic disorder [episodic paroxysmal anxiety] without agoraphobia: Secondary | ICD-10-CM | POA: Diagnosis not present

## 2019-10-25 DIAGNOSIS — G20 Parkinson's disease: Secondary | ICD-10-CM | POA: Diagnosis not present

## 2019-10-25 MED ORDER — AMANTADINE HCL 100 MG PO CAPS: 100.0000 mg | ORAL_CAPSULE | Freq: Two times a day (BID) | ORAL | 1 refills | Status: DC

## 2019-10-25 MED ORDER — ESCITALOPRAM OXALATE 20 MG PO TABS: 20.0000 mg | ORAL_TABLET | Freq: Every day | ORAL | 3 refills | Status: DC

## 2019-10-25 MED ORDER — CARBIDOPA-LEVODOPA 25-100 MG PO TABS: 1.0000 | ORAL_TABLET | Freq: Every day | ORAL | 1 refills | Status: DC

## 2019-10-25 MED ORDER — DIAZEPAM 5 MG PO TABS: 5.0000 mg | ORAL_TABLET | Freq: Four times a day (QID) | ORAL | 0 refills | Status: DC | PRN

## 2019-10-25 NOTE — Patient Instructions (Signed)
Health Maintenance Due  Topic Date Due  . HIV Screening  10/19/1984  . TETANUS/TDAP  09/19/2017  . INFLUENZA VACCINE  04/20/2019    Depression screen PHQ 2/9 09/27/2019  Decreased Interest 3  Down, Depressed, Hopeless 0  PHQ - 2 Score 3  Altered sleeping 2  Tired, decreased energy 3  Change in appetite 0  Feeling bad or failure about yourself  0  Trouble concentrating 0  Moving slowly or fidgety/restless 0  Suicidal thoughts 0  PHQ-9 Score 8  Difficult doing work/chores Very difficult

## 2019-10-25 NOTE — Progress Notes (Signed)
Sarah Welch is a 49 y.o. female  Chief Complaint  Patient presents with  . Follow-up    HPI: Sarah Welch is a 49 y.o. female here to f/u on anxiety and depressed mood. She was last seen by me 1 mo ago and was switched from celexa to lexapro 10mg  daily. She was also given an Rx for PRN valium. Today pt reports the diazepam is "very helpful" when she feels panic attacks starting. She also reports having less frequent feeling of panic, anxiety since last OV. She has A LOT of stress related to work. She feels alone at work and overwhelmed. 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 She states she lacks motivation to do anything on the weekends. She loves to pain but has not done so. She then gets frustrated with herself for this lack of motivation and productivity. She is going to establish with counselor at neurology office 11/06/19.    No flowsheet data found.   Depression screen [facility] 2/9 09/27/2019  Decreased Interest 3  Down, Depressed, Hopeless 0  PHQ - 2 Score 3  Altered sleeping 2  Tired, decreased energy 3  Change in appetite 0  Feeling bad or failure about yourself  0  Trouble concentrating 0  Moving slowly or fidgety/restless 0  Suicidal thoughts 0  PHQ-9 Score 8  Difficult doing work/chores Very difficult     Past Medical History:  Diagnosis Date  . Abnormal Pap smear of cervix 1990    unsure abnormality, colpo OK  . Parkinson disease ([place]) 12/15    Past Surgical History:  Procedure Laterality Date  . AUGMENTATION MAMMAPLASTY Bilateral 2004   implants, saline  . COLPOSCOPY    . DILATION AND CURETTAGE OF UTERUS  1992   following SAB    Social History   Socioeconomic History  . Marital status: Married    Spouse name: Not on file  . Number of children: 1  . Years of education: Not on file  . Highest education level: Associate degree: academic program  Occupational History  . 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 Smoking status: Former Smoker    Packs/day: 0.50    Types: Cigarettes    Quit date: 07/04/2016    Years since quitting: 3.3  . Smokeless tobacco: Never Used  Substance and Sexual Activity  . Alcohol use: No    Alcohol/week: 0.0 standard drinks  . Drug use: No  . Sexual activity: Yes    Partners: Male    Birth control/protection: Post-menopausal  Other Topics Concern  . Not on file  Social History Narrative  . Not on file   Social Determinants of Health   Financial Resource Strain:   . Difficulty of Paying Living Expenses: Not on file  Food Insecurity:   . Worried About Charity fundraiser in the Last Year: Not on file  . Ran Out of Food in the Last Year: Not on file  Transportation Needs:   . Lack of Transportation (Medical): Not on file  . Lack of Transportation (Non-Medical): Not on file  Physical Activity:   . Days of Exercise per Week: Not on file  . Minutes of Exercise per Session: Not on file  Stress:   . Feeling of Stress : Not on file  Social Connections:   . Frequency of Communication with Friends and Family: Not  on file  . Frequency of Social Gatherings with Friends and Family: Not on file  . Attends Religious Services: Not on file  . Active Member of Clubs or Organizations: Not on file  . Attends Archivist Meetings: Not on file  . Marital Status: Not on file  Intimate Partner Violence:   . Fear of Current or Ex-Partner: Not on file  . Emotionally Abused: Not on file  . Physically Abused: Not on file  . Sexually Abused: Not on file    Family History  Problem Relation Age of Onset  . Heart disease Mother   . Diabetes Maternal Grandmother   . Hypertension Maternal Grandmother   . Diabetes Maternal Grandfather   . Hypertension Maternal Grandfather   . Stroke Paternal Grandmother   . Heart attack Paternal Grandfather   . Alcohol abuse Father   . Healthy Sister   . Healthy Daughter       Immunization History  Administered Date(s) Administered  . Influenza-Unspecified 06/22/2016, 06/02/2017  . Tdap 09/20/2007    Outpatient Encounter Medications as of 10/25/2019  Medication Sig  . amantadine (SYMMETREL) 100 MG capsule Take 1 capsule (100 mg total) by mouth 2 (two) times daily.  . carbidopa-levodopa (SINEMET CR) 50-200 MG tablet Take 1 tablet by mouth 2 (two) times daily.  . carbidopa-levodopa (SINEMET IR) 25-100 MG tablet Take 1 tablet by mouth 6 (six) times daily.  . diazepam (VALIUM) 5 MG tablet Take 1 tablet (5 mg total) by mouth every 6 (six) hours as needed for anxiety.  Marland Kitchen escitalopram (LEXAPRO) 20 MG tablet Take 1 tablet (20 mg total) by mouth daily.  . Vitamin D, Ergocalciferol, (DRISDOL) 1.25 MG (50000 UT) CAPS capsule Take 1 capsule (50,000 Units total) by mouth every 7 (seven) days.  . [DISCONTINUED] diazepam (VALIUM) 5 MG tablet Take 1 tablet (5 mg total) by mouth every 6 (six) hours as needed for anxiety.  . [DISCONTINUED] escitalopram (LEXAPRO) 10 MG tablet Take 1 tablet (10 mg total) by mouth daily.   No facility-administered encounter medications on file as of 10/25/2019.     ROS: Pertinent positives and negatives noted in HPI. Remainder of ROS non-contributory  Allergies  Allergen Reactions  . Ciprofloxacin Other (See Comments)    SEVERE JOINT PAIN  . Pramipexole Other (See Comments)    Could not get up for 24 hours    BP 120/80 (BP Location: Left Arm, Patient Position: Sitting, Cuff Size: Normal)   Pulse 63   Temp 97.8 F (36.6 C) (Temporal)   Ht 5' 4.75" (1.645 m)   Wt 161 lb 3.2 oz (73.1 kg)   LMP 01/14/2016 (Exact Date)   SpO2 98%   BMI 27.03 kg/m   Physical Exam  Constitutional: She is oriented to person, place, and time. She appears well-developed and well-nourished. No distress.  Cardiovascular: Normal rate, regular rhythm and normal heart sounds.  Pulmonary/Chest: Effort normal. No respiratory distress.  Neurological: She is alert  and oriented to person, place, and time. Coordination normal.  Psychiatric: She has a normal mood and affect. Her behavior is normal.  Pt seems overwhelmed, frustrated      A/P:  1. Anxiety 2. Depressed mood 3. Panic attack as reaction to stress Increase: - escitalopram (LEXAPRO) 20 MG tablet; Take 1 tablet (20 mg total) by mouth daily.  Dispense: 90 tablet; Refill: 3 - increased from 10mg  to 20mg  Refill: - diazepam (VALIUM) 5 MG tablet; Take 1 tablet (5 mg total) by mouth every 6 (six)  hours as needed for anxiety.  Dispense: 30 tablet; Refill: 0 - I am very happy pt is going to establish with [facility] counselor who specializes in parkinson's - congratulated her on this - work is the main and largest source of stress for pt and this is contributing significantly to pts current mental status. I encouraged her to speak with her manager about help for her role and to also explore 2-4 wks of time off using FMLA in order to work on her mental and physical health. Pt will discuss with husband. She does agree that work, if it continues in current state, will soon be too much for her to mentally handle.   This visit occurred during the SARS-CoV-2 public health emergency.  Safety protocols were in place, including screening questions prior to the visit, additional usage of staff PPE, and extensive cleaning of exam room while observing appropriate contact time as indicated for disinfecting solutions.

## 2019-10-31 ENCOUNTER — Encounter: Payer: Self-pay | Admitting: Family Medicine

## 2019-10-31 DIAGNOSIS — E559 Vitamin D deficiency, unspecified: Secondary | ICD-10-CM

## 2019-10-31 NOTE — Telephone Encounter (Signed)
Please see message and advise.  Thank you. ° °

## 2019-11-06 ENCOUNTER — Ambulatory Visit: Payer: Federal, State, Local not specified - PPO | Admitting: Clinical

## 2019-11-06 ENCOUNTER — Other Ambulatory Visit: Payer: Self-pay

## 2019-11-06 DIAGNOSIS — F330 Major depressive disorder, recurrent, mild: Secondary | ICD-10-CM

## 2019-11-11 ENCOUNTER — Telehealth: Payer: Self-pay | Admitting: Neurology

## 2019-11-11 ENCOUNTER — Other Ambulatory Visit (INDEPENDENT_AMBULATORY_CARE_PROVIDER_SITE_OTHER): Payer: Federal, State, Local not specified - PPO

## 2019-11-11 ENCOUNTER — Other Ambulatory Visit: Payer: Federal, State, Local not specified - PPO

## 2019-11-11 ENCOUNTER — Other Ambulatory Visit: Payer: Self-pay

## 2019-11-11 DIAGNOSIS — F330 Major depressive disorder, recurrent, mild: Secondary | ICD-10-CM

## 2019-11-11 DIAGNOSIS — E559 Vitamin D deficiency, unspecified: Secondary | ICD-10-CM

## 2019-11-11 LAB — VITAMIN D 25 HYDROXY (VIT D DEFICIENCY, FRACTURES): VITD: 49.37 ng/mL (ref 30.00–100.00)

## 2019-11-11 NOTE — Telephone Encounter (Signed)
Tee, please place referral for Dr. Clovis Pu for psychiatry at [facility] and fax the referral to them as well.  Dx:  Depression.Michel Bickers

## 2019-11-11 NOTE — BH Specialist Note (Signed)
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 Pt presents today for behavioral health supportive counseling for presenting problem of low-mood and anxiety sx. Though pt endorse h/o mild depression, currently sx seem to be exacerbated by chronic workplace stress ~89yr with sx increasing in severity 4 mo ago in October 2020.  LCSW provided solution-focusing counseling in discussing treatment options. Pt is currently finding relief of panic sx with medication management, Lexapro 20 and Diazepam 5. However, pt reports minimal improvement in depressive sx despite numerous anti-depressant trials. LCSW provided psychoeducation re benefit of combined tx of medication plus therapy to treat depression. Also discussed the risks of long term benzodiazepine use and benefits of psychiatric medication management for a more specific treatment approach, pt open to referral to psychiatry.  LCSW discussed availability of individual counseling sessions to focus on functional wellness. Given pt h/o depression without adequate prior treatment, along with chronic workplace stress pt would likely benefit from longer term counseling, LCSW provided referral information to pt. Pt was receptive to treatment recommendations.    Brief Interventions provided today in session 1. psychoeducation, patient education 2. Supportive counseling   Plan 1. Establish with psychiatry   2. Establish  with counseling  3. Consider long-term options to address workplace stress  Behavioral Health treatment recommendations communicated to referring provider and pt states agreement with plan. LCSW will remain available for future consultation.

## 2019-11-11 NOTE — Telephone Encounter (Signed)
-----   Message from Corwin Levins, [place] sent at 11/11/2019  1:54 PM EST ----- Regarding: psychiatry referral Hey,  This pt has a h/o depression, much worse now with chronic workplace stress. She is interested in a referral to psychiatry b/c her current SSRi isnt effective enough and she's tried other SSRI's.  I wasn't sure if the referral should come from you? Thanks

## 2019-11-12 ENCOUNTER — Telehealth: Payer: Self-pay | Admitting: Certified Nurse Midwife

## 2019-11-12 ENCOUNTER — Encounter: Payer: Self-pay | Admitting: Family Medicine

## 2019-11-12 DIAGNOSIS — R4589 Other symptoms and signs involving emotional state: Secondary | ICD-10-CM

## 2019-11-12 DIAGNOSIS — F419 Anxiety disorder, unspecified: Secondary | ICD-10-CM

## 2019-11-12 NOTE — Telephone Encounter (Signed)
Patient sent the following message through [facility]. Routing to triage to assist patient with request.  [name] Clinical Pool  Phone Number: [phone]  Good morning,   I had my Vit D checked yesterday at my workplace. You should be able to see the results.  Looks like the medication worked. What do I need to do from here?   Thank you,  Sarah Welch

## 2019-11-12 NOTE — Telephone Encounter (Signed)
Spoke with patient, advised per Deborah Leonard, CNM. Patient verbalizes understanding and is agreeable.   Encounter closed.  

## 2019-11-12 NOTE — Telephone Encounter (Signed)
She can stop her Rx and start on OTC Vitamin D 3 2000 IU daily

## 2019-11-12 NOTE — Telephone Encounter (Signed)
Referral placed and faxed to Dr Beryle Quant office.   Gave patient Dr Cottle's phone number and advised her to contact him if she does not hear back from them by the end of the week. She voiced understanding.

## 2019-11-12 NOTE — Telephone Encounter (Signed)
Routing to Melvia Heaps, CNM to review Vit D and advise.

## 2019-11-13 MED ORDER — VENLAFAXINE HCL ER 37.5 MG PO CP24: ORAL_CAPSULE | ORAL | 3 refills | Status: DC

## 2019-11-14 MED ORDER — BUPROPION HCL ER (XL) 150 MG PO TB24: ORAL_TABLET | ORAL | 2 refills | Status: DC

## 2019-12-09 ENCOUNTER — Encounter: Payer: Self-pay | Admitting: Certified Nurse Midwife

## 2019-12-10 ENCOUNTER — Other Ambulatory Visit: Payer: Self-pay | Admitting: Family Medicine

## 2019-12-10 DIAGNOSIS — F410 Panic disorder [episodic paroxysmal anxiety] without agoraphobia: Secondary | ICD-10-CM

## 2019-12-10 NOTE — Telephone Encounter (Signed)
Last OV 10/25/19 Last fill 10/25/19  #30/0

## 2019-12-12 MED ORDER — DIAZEPAM 5 MG PO TABS: 5.0000 mg | ORAL_TABLET | Freq: Four times a day (QID) | ORAL | 0 refills | Status: DC | PRN

## 2019-12-16 ENCOUNTER — Ambulatory Visit: Payer: Federal, State, Local not specified - PPO | Admitting: Psychology

## 2019-12-18 ENCOUNTER — Ambulatory Visit (INDEPENDENT_AMBULATORY_CARE_PROVIDER_SITE_OTHER): Payer: Federal, State, Local not specified - PPO | Admitting: Psychiatry

## 2019-12-18 ENCOUNTER — Other Ambulatory Visit: Payer: Self-pay

## 2019-12-18 ENCOUNTER — Encounter: Payer: Self-pay | Admitting: Psychiatry

## 2019-12-18 VITALS — BP 129/88 | HR 84 | Ht 65.0 in | Wt 150.0 lb

## 2019-12-18 DIAGNOSIS — F411 Generalized anxiety disorder: Secondary | ICD-10-CM | POA: Diagnosis not present

## 2019-12-18 DIAGNOSIS — F410 Panic disorder [episodic paroxysmal anxiety] without agoraphobia: Secondary | ICD-10-CM

## 2019-12-18 DIAGNOSIS — F331 Major depressive disorder, recurrent, moderate: Secondary | ICD-10-CM

## 2019-12-18 MED ORDER — DULOXETINE HCL 30 MG PO CPEP: ORAL_CAPSULE | ORAL | 1 refills | Status: DC

## 2019-12-18 NOTE — Patient Instructions (Addendum)
Start duloxetine 1 daily and reduce Lexapro to 1 tablet or 10 mg daily for 1 week, Then increase duloxetine to 2 capsules daily and reduce Lexapro to 1/2 tablet daily for 1 week Then stop Lexapro  Stop bupropion

## 2019-12-18 NOTE — Progress Notes (Signed)
[facility] MD/PA/NP Initial Note  12/18/2019 11:04 AM Sarah Welch  MRN:  [id number]  Chief Complaint:  Chief Complaint    Depression; Anxiety     depression and anxiety  HPI: Referred by Dr. Carles Collet  On lexapro for 2 mos NR. Jan 8- Feb 5 took 10 mg, then 20 mg since. Can't tolerate Wellbutrin XL 150 DT shakiness  Doing OK until October when lost coworker and her work load went up.  No sig other stressors. Dx early onset PD pretty well-controlled. Jan panic at work without trigger x 2. Rx diazepam 5 mg am.  And now takes it prn. On weekends no motivation nor energy. Pt reports that mood is Anxious, Depressed, Irritable and guilt. and describes anxiety & depression as Moderate to severe. Anxiety symptoms include: Excessive Worry, Panic Symptoms,. Pt reports has restless sleep, has frequent nighttime awakenings and is not rested upon awakening. Pt reports that appetite is good. Pt reports that energy is good.  Works out with Clinical research associate but no energy at home.  and anhedonia, loss of interest or pleasure in usual activities, poor motivation and withdrawn from usual activities. Concentration is down slightly. Suicidal thoughts:  denied by patient.   Visit Diagnosis:    ICD-10-CM   1. Major depressive disorder, recurrent episode, moderate (HCC)  F33.1 DULoxetine (CYMBALTA) 30 MG capsule  2. Generalized anxiety disorder  F41.1 DULoxetine (CYMBALTA) 30 MG capsule  3. Panic disorder without agoraphobia  F41.0 DULoxetine (CYMBALTA) 30 MG capsule    Past Psychiatric History:  No psychiatrist or counselor. Past Psychiatric Medication Trials: Celexa, Lexapro, Wellbutrin SE,  Diazepam   Past Medical History:  Past Medical History:  Diagnosis Date  . Abnormal Pap smear of cervix 1990    unsure abnormality, colpo OK  . Parkinson disease ([place]) 12/15    Past Surgical History:  Procedure Laterality Date  . AUGMENTATION MAMMAPLASTY Bilateral 2004   implants, saline  . COLPOSCOPY    . DILATION  AND CURETTAGE OF UTERUS  1992   following SAB    Family Psychiatric History: aunt chronic depression & hospitalized, Mo undx depression, . 1 sister OK. No suicide nor bipolar.  No D&A hx.  Family History:  Family History  Problem Relation Age of Onset  . Heart disease Mother   . Diabetes Maternal Grandmother   . Hypertension Maternal Grandmother   . Diabetes Maternal Grandfather   . Hypertension Maternal Grandfather   . Stroke Paternal Grandmother   . Heart attack Paternal Grandfather   . Alcohol abuse Father   . Healthy Sister   . Healthy Daughter     Social History: Happily married. Rare alcohol. No history of D&A problems. 1 daughter doing well with 2 gkids. Social History   Socioeconomic History  . Marital status: Married    Spouse name: Not on file  . Number of children: 1  . Years of education: Not on file  . Highest education level: Associate degree: academic program  Occupational History  . 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 Smoking status: Former Smoker    Packs/day: 0.50    Types: Cigarettes    Quit date: 07/04/2016    Years since quitting: 3.4  . Smokeless tobacco: Never Used  Substance and Sexual Activity  . Alcohol use: No    Alcohol/week: 0.0 standard drinks  . Drug use: No  . Sexual activity: Yes    Partners: Male  Birth control/protection: Post-menopausal  Other Topics Concern  . Not on file  Social History Narrative  . Not on file   Social Determinants of Health   Financial Resource Strain:   . Difficulty of Paying Living Expenses:   Food Insecurity:   . Worried About Charity fundraiser in the Last Year:   . Arboriculturist in the Last Year:   Transportation Needs:   . Film/video editor (Medical):   Marland Kitchen Lack of Transportation (Non-Medical):   Physical Activity:   . Days of Exercise per Week:   . Minutes of Exercise per Session:   Stress:   . Feeling of  Stress :   Social Connections:   . Frequency of Communication with Friends and Family:   . Frequency of Social Gatherings with Friends and Family:   . Attends Religious Services:   . Active Member of Clubs or Organizations:   . Attends Archivist Meetings:   Marland Kitchen Marital Status:     Allergies:  Allergies  Allergen Reactions  . Ciprofloxacin Other (See Comments)    SEVERE JOINT PAIN  . Pramipexole Other (See Comments)    Could not get up for 24 hours    Metabolic Disorder Labs: Lab Results  Component Value Date   HGBA1C 5.1 03/27/2014   MPG 100 03/27/2014   No results found for: PROLACTIN Lab Results  Component Value Date   CHOL 178 08/14/2019   TRIG 66 08/14/2019   HDL 71 08/14/2019   CHOLHDL 2.5 08/14/2019   VLDL 11 04/02/2015   LDLCALC 94 08/14/2019   LDLCALC 78 03/30/2018   Lab Results  Component Value Date   TSH 0.783 08/14/2019   TSH 1.450 03/30/2018    Therapeutic Level Labs: No results found for: LITHIUM No results found for: VALPROATE No components found for:  CBMZ  Current Medications: Current Outpatient Medications  Medication Sig Dispense Refill  . amantadine (SYMMETREL) 100 MG capsule Take 1 capsule (100 mg total) by mouth 2 (two) times daily. 180 capsule 1  . buPROPion (WELLBUTRIN XL) 150 MG 24 hr tablet 1 tab po daily x 1 week then may increase to 2 tabs po daily (Patient taking differently: Take 75 mg by mouth daily. 1 tab po daily x 1 week then may increase to 2 tabs po daily) 60 tablet 2  . carbidopa-levodopa (SINEMET CR) 50-200 MG tablet Take 1 tablet by mouth 2 (two) times daily. 120 tablet 1  . carbidopa-levodopa (SINEMET IR) 25-100 MG tablet Take 1 tablet by mouth 6 (six) times daily. 540 tablet 1  . diazepam (VALIUM) 5 MG tablet Take 1 tablet (5 mg total) by mouth every 6 (six) hours as needed for anxiety. 30 tablet 0  . escitalopram (LEXAPRO) 20 MG tablet Take 20 mg by mouth daily.    . Vitamin D, Ergocalciferol, (DRISDOL) 1.25 MG  (50000 UT) CAPS capsule Take 1 capsule (50,000 Units total) by mouth every 7 (seven) days. 12 capsule 0  . DULoxetine (CYMBALTA) 30 MG capsule 1 daily for 1 week, then 2 daily 30 capsule 1   No current facility-administered medications for this visit.    Medication Side Effects: shakiness from Wellbutrin  Orders placed this visit:  No orders of the defined types were placed in this encounter.   Psychiatric Specialty Exam:  Review of Systems  Blood pressure 129/88, pulse 84, height 5\' 5"  (1.651 m), weight 150 lb (68 kg), last menstrual period 01/14/2016.Body mass index is 24.96 kg/m.  General Appearance: Neat  Eye Contact:  Good  Speech:  Clear and Coherent and Normal Rate  Volume:  Normal  Mood:  Anxious and Depressed  Affect:  Congruent and Restricted  Thought Process:  Goal Directed and Descriptions of Associations: Intact  Orientation:  Full (Time, Place, and Person)  Thought Content: Logical and Abstract Reasoning   Suicidal Thoughts:  No  Homicidal Thoughts:  No  Memory:  WNL  Judgement:  Good  Insight:  Good  Psychomotor Activity:  Normal  Concentration:  Concentration: Good  Recall:  Good  Fund of Knowledge: Good  Language: Good  Assets:  Communication Skills Desire for Improvement Financial Resources/Insurance Housing Intimacy Leisure Time Resilience Social Support Talents/Skills Transportation Vocational/Educational  ADL's:  Intact  Cognition: WNL  Prognosis:  Good   Screenings:  GAD-7     Office Visit from 10/25/2019 in [facility]  Total GAD-7 Score  7    PHQ2-9     Office Visit from 10/25/2019 in [facility] Visit from 09/27/2019 in [facility]  PHQ-2 Total Score  3  3  PHQ-9 Total Score  11  8     MDQ negative Receiving Psychotherapy: No   Treatment Plan/Recommendations: This was a 60-minute appointment in which we discussed the following.  Discussed diagnosis of major  depression, generalized anxiety and panic disorder.  Fortunately she has not developed agoraphobia.  She feels her current symptoms are out of proportion to the degree of stress.  She is under some stress at work due to being short staffed.  She has a supportive home environment and supportive husband.  Disc typical algorithms for depression and anxiety in detail and SE each option in detail.  We could consider another SSRI such as sertraline which is a very good for panic and depression.  She is not a candidate for augmentation with aripiprazole or other partial dopamine agonist due to Parkinson's disease.  She did not tolerate bupropion.  The next logical step after having taken 2 SSRIs is to switch to an SNRI.  Discussed side effects in detail  She agrees with the plan.  DC Wellbutrin bc intolerance.  Start duloxetine 1 daily and reduce Lexapro to 1 tablet or 10 mg daily for 1 week, Then increase duloxetine to 2 capsules daily and reduce Lexapro to 1/2 tablet daily for 1 week Then stop Lexapro  Disc SE in detail and SSRI withdrawal sx.  Follow-up 6 to 8 weeks   Purnell Shoemaker, MD

## 2019-12-27 MED ORDER — CARBIDOPA-LEVODOPA ER 50-200 MG PO TBCR: 1.0000 | EXTENDED_RELEASE_TABLET | Freq: Two times a day (BID) | ORAL | 1 refills | Status: DC

## 2020-01-19 ENCOUNTER — Emergency Department (HOSPITAL_BASED_OUTPATIENT_CLINIC_OR_DEPARTMENT_OTHER)
Admission: EM | Admit: 2020-01-19 | Discharge: 2020-01-19 | Disposition: A | Discharge status: Home / Self Care | Payer: Federal, State, Local not specified - PPO | Attending: Emergency Medicine | Admitting: Emergency Medicine

## 2020-01-19 ENCOUNTER — Encounter (HOSPITAL_BASED_OUTPATIENT_CLINIC_OR_DEPARTMENT_OTHER): Payer: Self-pay

## 2020-01-19 ENCOUNTER — Other Ambulatory Visit: Payer: Self-pay

## 2020-01-19 DIAGNOSIS — Z87891 Personal history of nicotine dependence: Secondary | ICD-10-CM | POA: Diagnosis not present

## 2020-01-19 DIAGNOSIS — Z79899 Other long term (current) drug therapy: Secondary | ICD-10-CM | POA: Diagnosis not present

## 2020-01-19 DIAGNOSIS — Z888 Allergy status to other drugs, medicaments and biological substances status: Secondary | ICD-10-CM | POA: Diagnosis not present

## 2020-01-19 DIAGNOSIS — Z881 Allergy status to other antibiotic agents status: Secondary | ICD-10-CM | POA: Diagnosis not present

## 2020-01-19 DIAGNOSIS — G20 Parkinson's disease: Secondary | ICD-10-CM | POA: Diagnosis not present

## 2020-01-19 DIAGNOSIS — R42 Dizziness and giddiness: Secondary | ICD-10-CM | POA: Insufficient documentation

## 2020-01-19 LAB — CBC WITH DIFFERENTIAL/PLATELET
Abs Immature Granulocytes: 0.01 10*3/uL (ref 0.00–0.07)
Basophils Absolute: 0.1 10*3/uL (ref 0.0–0.1)
Basophils Relative: 1 %
Eosinophils Absolute: 0.2 10*3/uL (ref 0.0–0.5)
Eosinophils Relative: 3 %
HCT: 43.9 % (ref 36.0–46.0)
Hemoglobin: 15 g/dL (ref 12.0–15.0)
Immature Granulocytes: 0 %
Lymphocytes Relative: 38 %
Lymphs Abs: 2.1 10*3/uL (ref 0.7–4.0)
MCH: 31.1 pg (ref 26.0–34.0)
MCHC: 34.2 g/dL (ref 30.0–36.0)
MCV: 90.9 fL (ref 80.0–100.0)
Monocytes Absolute: 0.5 10*3/uL (ref 0.1–1.0)
Monocytes Relative: 8 %
Neutro Abs: 2.8 10*3/uL (ref 1.7–7.7)
Neutrophils Relative %: 50 %
Platelets: 241 10*3/uL (ref 150–400)
RBC: 4.83 MIL/uL (ref 3.87–5.11)
RDW: 12.3 % (ref 11.5–15.5)
WBC: 5.6 10*3/uL (ref 4.0–10.5)
nRBC: 0 % (ref 0.0–0.2)

## 2020-01-19 LAB — BASIC METABOLIC PANEL
Anion gap: 9 (ref 5–15)
BUN: 18 mg/dL (ref 6–20)
CO2: 25 mmol/L (ref 22–32)
Calcium: 9.3 mg/dL (ref 8.9–10.3)
Chloride: 105 mmol/L (ref 98–111)
Creatinine, Ser: 0.84 mg/dL (ref 0.44–1.00)
GFR calc Af Amer: >60 mL/min (ref >60)
GFR calc non Af Amer: >60 mL/min (ref >60)
Glucose, Bld: 121 mg/dL — ABNORMAL HIGH (ref 70–99)
Potassium: 3.9 mmol/L (ref 3.5–5.1)
Sodium: 139 mmol/L (ref 135–145)

## 2020-01-19 MED ORDER — SODIUM CHLORIDE 0.9 % IV BOLUS
1000.0000 mL | Freq: Once | INTRAVENOUS | Status: AC
  Administered 2020-01-19: 1000 mL via INTRAVENOUS

## 2020-01-19 NOTE — Discharge Instructions (Signed)
Home to rest, call your neurologist tomorrow and arrange follow-up.  Return to the emergency room for worsening or concerning symptoms.

## 2020-01-19 NOTE — ED Provider Notes (Signed)
[facility] EMERGENCY DEPARTMENT Provider Note   CSN: [id number] Arrival date & time: 01/19/20  1143     History Chief Complaint  Patient presents with  . Dizziness    Sarah Welch is a 49 y.o. female.  49yo female with history of parkinson's presents with complaint of feeling light headed since Thursday afternoon when leaving work. Feels like might pass out, has not passed out. Feels SHOB, reports this is normal for her, has been told its related to anxiety and started Cymbalta for this 3 weeks ago. Denies CP, does feel palpitations at times. Light headed feeling is worse with walking, improves with lying flat however SHOB is worse with lying flat, sleeps with 1 pillow, no changes with this. No lower extremity edema. No fevers, chills. Reports feeling hot, slept with cold rag on face last night. Has tried dramamine and valium without improvement.         Past Medical History:  Diagnosis Date  . Abnormal Pap smear of cervix 1990    unsure abnormality, colpo OK  . Parkinson disease ([place]) 12/15    Patient Active Problem List   Diagnosis Date Noted  . Acute right-sided low back pain without sciatica 09/22/2016  . Allergy to pollen 04/07/2016  . Headache 04/07/2016  . Bronchitis 10/02/2015  . Acute non-recurrent frontal sinusitis 10/02/2015  . Restless leg 12/23/2014  . Anxiety 12/10/2014  . Parkinson's disease ([place]) 08/19/2014  . Cervical radiculopathy 07/29/2014  . Leg cramps 07/29/2014  . Hemiparesis ([place]) 07/01/2014  . Abnormal involuntary movement 07/01/2014  . Tremor of left hand 07/01/2014  . Dysfunctional uterine bleeding 06/08/2014    Past Surgical History:  Procedure Laterality Date  . AUGMENTATION MAMMAPLASTY Bilateral 2004   implants, saline  . COLPOSCOPY    . DILATION AND CURETTAGE OF UTERUS  1992   following SAB     OB History    Gravida  2   Para  1   Term  1   Preterm      AB  1   Living  1     SAB  1   TAB      Ectopic       Multiple      Live Births  1           Family History  Problem Relation Age of Onset  . Heart disease Mother   . Diabetes Maternal Grandmother   . Hypertension Maternal Grandmother   . Diabetes Maternal Grandfather   . Hypertension Maternal Grandfather   . Stroke Paternal Grandmother   . Heart attack Paternal Grandfather   . Alcohol abuse Father   . Healthy Sister   . Healthy Daughter     Social History   Tobacco Use  . Smoking status: Former Smoker    Packs/day: 0.50    Types: Cigarettes    Quit date: 07/04/2016    Years since quitting: 3.5  . Smokeless tobacco: Never Used  Substance Use Topics  . Alcohol use: No    Alcohol/week: 0.0 standard drinks  . Drug use: No    Home Medications Prior to Admission medications   Medication Sig Start Date End Date Taking? Authorizing Provider  amantadine (SYMMETREL) 100 MG capsule Take 1 capsule (100 mg total) by mouth 2 (two) times daily. 10/25/19  Yes Tat, Eustace Quail, DO  buPROPion (WELLBUTRIN XL) 150 MG 24 hr tablet 1 tab po daily x 1 week then may increase to 2 tabs po daily Patient taking  differently: Take 75 mg by mouth daily. 1 tab po daily x 1 week then may increase to 2 tabs po daily 11/14/19  Yes Cirigliano, Mary K, DO  carbidopa-levodopa (SINEMET CR) 50-200 MG tablet Take 1 tablet by mouth 2 (two) times daily. 12/27/19  Yes Tat, Eustace Quail, DO  carbidopa-levodopa (SINEMET IR) 25-100 MG tablet Take 1 tablet by mouth 6 (six) times daily. 10/25/19  Yes Tat, Eustace Quail, DO  diazepam (VALIUM) 5 MG tablet Take 1 tablet (5 mg total) by mouth every 6 (six) hours as needed for anxiety. 12/12/19  Yes Cirigliano, Garvin Fila, DO  DULoxetine (CYMBALTA) 30 MG capsule 1 daily for 1 week, then 2 daily 12/18/19  Yes Cottle, Billey Co., MD  escitalopram (LEXAPRO) 20 MG tablet Take 20 mg by mouth daily.   Yes [provider]  Vitamin D, Ergocalciferol, (DRISDOL) 1.25 MG (50000 UT) CAPS capsule Take 1 capsule (50,000 Units total) by  mouth every 7 (seven) days. 08/19/19  Yes Regina Eck, CNM    Allergies    Ciprofloxacin and Pramipexole  Review of Systems   Review of Systems  Constitutional: Negative for chills and fever.  Eyes: Negative for visual disturbance.  Gastrointestinal: Negative for nausea and vomiting.  Musculoskeletal: Negative for gait problem.  Skin: Negative for rash and wound.  Allergic/Immunologic: Negative for immunocompromised state.  Neurological: Positive for light-headedness. Negative for speech difficulty and weakness.  All other systems reviewed and are negative.   Physical Exam Updated Vital Signs BP 124/88 (BP Location: Right Arm)   Pulse 74   Temp 97.6 F (36.4 C) (Oral)   Resp 14   Ht 5\' 4"  (1.626 m)   Wt 68 kg   LMP 01/14/2016 (Exact Date)   SpO2 100%   BMI 25.75 kg/m   Physical Exam Vitals and nursing note reviewed.  Constitutional:      General: She is not in acute distress.    Appearance: She is well-developed. She is not diaphoretic.  HENT:     Head: Normocephalic and atraumatic.     Mouth/Throat:     Mouth: Mucous membranes are moist.     Pharynx: No oropharyngeal exudate or posterior oropharyngeal erythema.  Eyes:     Extraocular Movements: Extraocular movements intact.     Pupils: Pupils are equal, round, and reactive to light.  Cardiovascular:     Rate and Rhythm: Normal rate and regular rhythm.     Pulses: Normal pulses.     Heart sounds: Normal heart sounds.  Pulmonary:     Effort: Pulmonary effort is normal.     Breath sounds: Normal breath sounds.  Skin:    General: Skin is warm and dry.     Findings: No erythema or rash.  Neurological:     Mental Status: She is alert and oriented to person, place, and time.     Cranial Nerves: No cranial nerve deficit.     Sensory: No sensory deficit.     Motor: Tremor present. No weakness.     Coordination: Coordination normal.     Comments: Baseline left arm tremor  Psychiatric:        Behavior:  Behavior normal.     ED Results / Procedures / Treatments   Labs (all labs ordered are listed, but only abnormal results are displayed) Labs Reviewed  BASIC METABOLIC PANEL - Abnormal; Notable for the following components:      Result Value   Glucose, Bld 121 (*)    All  other components within normal limits  CBC WITH DIFFERENTIAL/PLATELET    EKG EKG Interpretation  Date/Time:  Sunday Jan 19 2020 12:50:27 EDT Ventricular Rate:  77 PR Interval:    QRS Duration: 88 QT Interval:  373 QTC Calculation: 423 R Axis:   75 Text Interpretation: Normal sinus rhythm Pt has a tremor Confirmed by Isla Pence [phone]) on 01/19/2020 12:54:26 PM   Radiology No results found.  Procedures Procedures (including critical care time)  Medications Ordered in ED Medications  sodium chloride 0.9 % bolus 1,000 mL (0 mLs Intravenous Stopped 01/19/20 1421)    ED Course  I have reviewed the triage vital signs and the nursing notes.  Pertinent labs & imaging results that were available during my care of the patient were reviewed by me and considered in my medical decision making (see chart for details).  Clinical Course as of Jan 19 1615  Sun Jan 18, 4714  2276 49 year old female with past medical history of Parkinson's presents with complaint of feeling lightheaded for the past few days.  Patient has tried Valium and Dramamine at home without improvement.  Neuro exam is unremarkable with exception of baseline tremor to the left hand.  Patient is not orthostatic, was given 1 L of normal saline and on recheck states that she is feeling some improvement.  Vital signs show improvement in patient's blood pressure.  Recommend patient stay home from work tomorrow and rest, follow-up with her neurologist and return to ER for new or worsening symptoms.   [LM]    Clinical Course User Index [LM] Roque Lias   MDM Rules/Calculators/A&P                      Final Clinical Impression(s) / ED  Diagnoses Final diagnoses:  Lightheadedness    Rx / DC Orders ED Discharge Orders    None       Tacy Learn, PA-C 01/19/20 1616    Isla Pence, MD 01/22/20 1549

## 2020-01-19 NOTE — ED Triage Notes (Signed)
Pt states that she has history of parkinson's, reports increased weakness since Friday with some SOB.

## 2020-01-19 NOTE — ED Notes (Signed)
ED Provider at bedside. 

## 2020-01-22 ENCOUNTER — Other Ambulatory Visit: Payer: Self-pay

## 2020-01-22 NOTE — Telephone Encounter (Signed)
Spoke with pharmacist and was informed that the patient received a 90 day supply of medication on 12/30/2019 and she should not need a refill this early.   Mychart message sent to patient.

## 2020-01-22 NOTE — Telephone Encounter (Signed)
Called pharmacy but they were not open will try again at [facility].

## 2020-01-29 ENCOUNTER — Ambulatory Visit: Payer: Federal, State, Local not specified - PPO | Admitting: Psychiatry

## 2020-01-30 ENCOUNTER — Other Ambulatory Visit: Payer: Self-pay

## 2020-01-30 DIAGNOSIS — F430 Acute stress reaction: Secondary | ICD-10-CM

## 2020-01-30 MED ORDER — DIAZEPAM 5 MG PO TABS: 5.0000 mg | ORAL_TABLET | Freq: Four times a day (QID) | ORAL | 0 refills | Status: DC | PRN

## 2020-01-30 NOTE — Telephone Encounter (Signed)
Last OV 10/25/19 Last fill  12/12/2019 #30/0 Pt had an appointment tomorrow but had to cancel due to work.

## 2020-01-31 ENCOUNTER — Ambulatory Visit: Payer: Federal, State, Local not specified - PPO | Admitting: Family Medicine

## 2020-02-06 ENCOUNTER — Other Ambulatory Visit: Payer: Self-pay | Admitting: Psychiatry

## 2020-02-06 DIAGNOSIS — F411 Generalized anxiety disorder: Secondary | ICD-10-CM

## 2020-02-06 DIAGNOSIS — F331 Major depressive disorder, recurrent, moderate: Secondary | ICD-10-CM

## 2020-02-06 DIAGNOSIS — F410 Panic disorder [episodic paroxysmal anxiety] without agoraphobia: Secondary | ICD-10-CM

## 2020-02-10 ENCOUNTER — Ambulatory Visit: Payer: Federal, State, Local not specified - PPO | Admitting: Psychology

## 2020-02-13 ENCOUNTER — Ambulatory Visit (INDEPENDENT_AMBULATORY_CARE_PROVIDER_SITE_OTHER): Payer: Federal, State, Local not specified - PPO | Admitting: Psychiatry

## 2020-02-13 ENCOUNTER — Other Ambulatory Visit: Payer: Self-pay

## 2020-02-13 ENCOUNTER — Encounter: Payer: Self-pay | Admitting: Psychiatry

## 2020-02-13 DIAGNOSIS — F411 Generalized anxiety disorder: Secondary | ICD-10-CM

## 2020-02-13 DIAGNOSIS — F410 Panic disorder [episodic paroxysmal anxiety] without agoraphobia: Secondary | ICD-10-CM | POA: Diagnosis not present

## 2020-02-13 DIAGNOSIS — F331 Major depressive disorder, recurrent, moderate: Secondary | ICD-10-CM

## 2020-02-13 MED ORDER — DULOXETINE HCL 30 MG PO CPEP: 30.0000 mg | ORAL_CAPSULE | Freq: Two times a day (BID) | ORAL | 0 refills | Status: DC

## 2020-02-13 NOTE — Progress Notes (Signed)
Sarah Welch [id number] 29-Nov-1970 49 y.o.  Subjective:   [name] is a 49 y.o. (DOB [birth year]) female.  Chief Complaint:  Chief Complaint  Patient presents with  . Follow-up  . Depression  . Anxiety    HPI Sarah Welch presents to the office today for follow-up of depression and anxiety.  Going good. Better motivated and she feelks better.  Occ anxiety spontaneous with diazepam helpful.  Overall anxiety level of anxiety is better and less depression.  Most improvement in the last 3 weeks with better activity.  Irritability resolved. Tolerating duloxetine prefers AM and noon.    Exercises with PD group and feels good physically.  Past Psychiatric History:  No psychiatrist or counselor. Past Psychiatric Medication Trials: Celexa, Lexapro, Wellbutrin SE,  Diazepam  Review of Systems:  Review of Systems  Neurological: Positive for tremors. Negative for dizziness and weakness.    Medications: I have reviewed the patient's current medications.  Current Outpatient Medications  Medication Sig Dispense Refill  . amantadine (SYMMETREL) 100 MG capsule Take 1 capsule (100 mg total) by mouth 2 (two) times daily. 180 capsule 1  . carbidopa-levodopa (SINEMET CR) 50-200 MG tablet Take 1 tablet by mouth 2 (two) times daily. 120 tablet 1  . carbidopa-levodopa (SINEMET IR) 25-100 MG tablet Take 1 tablet by mouth 6 (six) times daily. 540 tablet 1  . diazepam (VALIUM) 5 MG tablet Take 1 tablet (5 mg total) by mouth every 6 (six) hours as needed for anxiety. 30 tablet 0  . DULoxetine (CYMBALTA) 30 MG capsule Take 1 capsule (30 mg total) by mouth 2 (two) times daily. TAKE 1 CAPSULE BY MOUTH FOR 1 WEEK, THEN 2 CAPSULES DAILY 180 capsule 0  . Vitamin D, Ergocalciferol, (DRISDOL) 1.25 MG (50000 UT) CAPS capsule Take 1 capsule (50,000 Units total) by mouth every 7 (seven) days. 12 capsule 0   No current facility-administered medications for this visit.    Medication Side  Effects: None  Allergies:  Allergies  Allergen Reactions  . Ciprofloxacin Other (See Comments)    SEVERE JOINT PAIN  . Pramipexole Other (See Comments)    Could not get up for 24 hours    Past Medical History:  Diagnosis Date  . Abnormal Pap smear of cervix 1990    unsure abnormality, colpo OK  . Parkinson disease ([place]) 12/15    Family History  Problem Relation Age of Onset  . Heart disease Mother   . Diabetes Maternal Grandmother   . Hypertension Maternal Grandmother   . Diabetes Maternal Grandfather   . Hypertension Maternal Grandfather   . Stroke Paternal Grandmother   . Heart attack Paternal Grandfather   . Alcohol abuse Father   . Healthy Sister   . Healthy Daughter     Social History   Socioeconomic History  . Marital status: Married    Spouse name: Not on file  . Number of children: 1  . Years of education: Not on file  . Highest education level: Associate degree: academic program  Occupational History  . 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 Smoking status: Former Smoker    Packs/day: 0.50    Types: Cigarettes    Quit date: 07/04/2016    Years since quitting: 3.6  . Smokeless tobacco: Never Used  Substance and Sexual Activity  . Alcohol use: No    Alcohol/week: 0.0 standard drinks  . Drug  use: No  . Sexual activity: Yes    Partners: Male    Birth control/protection: Post-menopausal  Other Topics Concern  . Not on file  Social History Narrative  . Not on file   Social Determinants of Health   Financial Resource Strain:   . Difficulty of Paying Living Expenses:   Food Insecurity:   . Worried About Charity fundraiser in the Last Year:   . Arboriculturist in the Last Year:   Transportation Needs:   . Film/video editor (Medical):   Marland Kitchen Lack of Transportation (Non-Medical):   Physical Activity:   . Days of Exercise per Week:   . Minutes of Exercise per Session:    Stress:   . Feeling of Stress :   Social Connections:   . Frequency of Communication with Friends and Family:   . Frequency of Social Gatherings with Friends and Family:   . Attends Religious Services:   . Active Member of Clubs or Organizations:   . Attends Archivist Meetings:   Marland Kitchen Marital Status:   Intimate Partner Violence:   . Fear of Current or Ex-Partner:   . Emotionally Abused:   Marland Kitchen Physically Abused:   . Sexually Abused:     Past Medical History, Surgical history, Social history, and Family history were reviewed and updated as appropriate.   Please see review of systems for further details on the patient's review from today.   Objective:   Physical Exam:  LMP 01/14/2016 (Exact Date)   Physical Exam Constitutional:      General: She is not in acute distress. Musculoskeletal:        General: No deformity.  Neurological:     Mental Status: She is alert and oriented to person, place, and time.     Coordination: Coordination normal.  Psychiatric:        Attention and Perception: Attention and perception normal. She does not perceive auditory or visual hallucinations.        Mood and Affect: Mood is anxious. Mood is not depressed. Affect is not labile, blunt, angry or inappropriate.        Speech: Speech normal.        Behavior: Behavior normal.        Thought Content: Thought content normal. Thought content is not paranoid or delusional. Thought content does not include homicidal or suicidal ideation. Thought content does not include homicidal or suicidal plan.        Cognition and Memory: Cognition and memory normal.        Judgment: Judgment normal.     Comments: Insight intac Anxiety is better but still episodes     Lab Review:     Component Value Date/Time   NA 139 01/19/2020 1233   NA 141 08/14/2019 0828   K 3.9 01/19/2020 1233   CL 105 01/19/2020 1233   CO2 25 01/19/2020 1233   GLUCOSE 121 (H) 01/19/2020 1233   BUN 18 01/19/2020 1233   BUN  13 08/14/2019 0828   CREATININE 0.84 01/19/2020 1233   CREATININE 0.77 04/02/2015 0932   CALCIUM 9.3 01/19/2020 1233   PROT 7.0 08/14/2019 0828   ALBUMIN 4.2 08/14/2019 0828   AST 21 08/14/2019 0828   ALT 13 08/14/2019 0828   ALKPHOS 67 08/14/2019 0828   BILITOT 0.6 08/14/2019 0828   GFRNONAA >60 01/19/2020 1233   GFRAA >60 01/19/2020 1233       Component Value Date/Time   WBC 5.6 01/19/2020  1233   RBC 4.83 01/19/2020 1233   HGB 15.0 01/19/2020 1233   HGB 14.6 08/14/2019 0828   HGB 13.2 04/02/2015 0905   HCT 43.9 01/19/2020 1233   HCT 43.7 08/14/2019 0828   PLT 241 01/19/2020 1233   PLT 257 08/14/2019 0828   MCV 90.9 01/19/2020 1233   MCV 94 08/14/2019 0828   MCH 31.1 01/19/2020 1233   MCHC 34.2 01/19/2020 1233   RDW 12.3 01/19/2020 1233   RDW 12.1 08/14/2019 0828   LYMPHSABS 2.1 01/19/2020 1233   MONOABS 0.5 01/19/2020 1233   EOSABS 0.2 01/19/2020 1233   BASOSABS 0.1 01/19/2020 1233    No results found for: POCLITH, LITHIUM   No results found for: PHENYTOIN, PHENOBARB, VALPROATE, CBMZ   .res Assessment: Plan:    Sarah Welch was seen today for follow-up, depression and anxiety.  Diagnoses and all orders for this visit:  Major depressive disorder, recurrent episode, moderate (HCC) -     DULoxetine (CYMBALTA) 30 MG capsule; Take 1 capsule (30 mg total) by mouth 2 (two) times daily. TAKE 1 CAPSULE BY MOUTH FOR 1 WEEK, THEN 2 CAPSULES DAILY  Generalized anxiety disorder -     DULoxetine (CYMBALTA) 30 MG capsule; Take 1 capsule (30 mg total) by mouth 2 (two) times daily. TAKE 1 CAPSULE BY MOUTH FOR 1 WEEK, THEN 2 CAPSULES DAILY  Panic disorder without agoraphobia -     DULoxetine (CYMBALTA) 30 MG capsule; Take 1 capsule (30 mg total) by mouth 2 (two) times daily. TAKE 1 CAPSULE BY MOUTH FOR 1 WEEK, THEN [place] has the diagnoses noted above and was not well controlled on Wellbutrin and Lexapro.  Her symptoms of depression and anxiety are better with the  switch to duloxetine 30 mg twice daily.  She tends to have some dizziness if she takes 60 mg at once.  Otherwise she is tolerating the current dosage.  She has had near resolution of depression and resolution of the irritability.  She still has some episodic minor panic which is overall improved.  She is functioning better and more active.  She is satisfied with the med response at this time.  Expect further gradual improvement in the anxiety on the same current dose of duloxetine 30 mg twice daily.  She still takes diazepam on occasion but is not taking it as excessively.  Continue current meds  Disc SE in detail and SSRI withdrawal sx.  We discussed the short-term risks associated with benzodiazepines including sedation and increased fall risk among others.  Discussed long-term side effect risk including dependence, potential withdrawal symptoms, and the potential eventual dose-related risk of dementia.  But recent studies from 2020 dispute this association between benzodiazepines and dementia risk. Newer studies in 2020 do not support an association with dementia.  Follow-up 2 to 3 months and expect further gradual improvement. Call if you have any additional symptoms or problems with the medication.  Lynder Parents MD, DFAPA  Please see After Visit Summary for patient specific instructions.  Future Appointments  Date Time Provider [place]  02/21/2020  4:00 PM Ronnald Nian, DO [facility]  04/10/2020  8:15 AM Tat, Eustace Quail, DO [facility] None  05/18/2020  9:00 AM Cottle, Billey Co., MD [facility] None    No orders of the defined types were placed in this encounter.   -------------------------------

## 2020-02-21 ENCOUNTER — Ambulatory Visit: Payer: Federal, State, Local not specified - PPO | Admitting: Family Medicine

## 2020-04-02 ENCOUNTER — Ambulatory Visit: Payer: Federal, State, Local not specified - PPO | Admitting: Family Medicine

## 2020-04-02 ENCOUNTER — Other Ambulatory Visit: Payer: Self-pay

## 2020-04-02 ENCOUNTER — Encounter: Payer: Self-pay | Admitting: Family Medicine

## 2020-04-02 VITALS — BP 122/80 | HR 75 | Temp 97.8°F | Ht 64.0 in | Wt 154.8 lb

## 2020-04-02 DIAGNOSIS — F410 Panic disorder [episodic paroxysmal anxiety] without agoraphobia: Secondary | ICD-10-CM

## 2020-04-02 DIAGNOSIS — F419 Anxiety disorder, unspecified: Secondary | ICD-10-CM | POA: Diagnosis not present

## 2020-04-02 DIAGNOSIS — F430 Acute stress reaction: Secondary | ICD-10-CM

## 2020-04-02 DIAGNOSIS — R4589 Other symptoms and signs involving emotional state: Secondary | ICD-10-CM | POA: Diagnosis not present

## 2020-04-02 MED ORDER — DIAZEPAM 5 MG PO TABS: 5.0000 mg | ORAL_TABLET | Freq: Four times a day (QID) | ORAL | 0 refills | Status: DC | PRN

## 2020-04-02 NOTE — Progress Notes (Signed)
Sarah Welch is a 49 y.o. female  Chief Complaint  Patient presents with  . Follow-up    Medication refills    HPI: Sarah Welch is a 49 y.o. female seen today for routine f/u on anxiety and panic attacks.  She is taking cymbalta 30mg  BID and valium 5mg  PRN. She needs a refill on her valium. She feels she has more energy and more motivated.  She has not had any panic attacks recently. 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 She no longer has this help and is worried about how this will affect her.    Past Medical History:  Diagnosis Date  . Abnormal Pap smear of cervix 1990    unsure abnormality, colpo OK  . Parkinson disease ([place]) 12/15    Past Surgical History:  Procedure Laterality Date  . AUGMENTATION MAMMAPLASTY Bilateral 2004   implants, saline  . COLPOSCOPY    . DILATION AND CURETTAGE OF UTERUS  1992   following SAB    Social History   Socioeconomic History  . Marital status: Married    Spouse name: Not on file  . Number of children: 1  . Years of education: Not on file  . Highest education level: Associate degree: academic program  Occupational History  . 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 Smoking status: Former Smoker    Packs/day: 0.50    Types: Cigarettes    Quit date: 07/04/2016    Years since quitting: 3.7  . Smokeless tobacco: Never Used  Vaping Use  . Vaping Use: Never used  Substance and Sexual Activity  . Alcohol use: No    Alcohol/week: 0.0 standard drinks  . Drug use: No  . Sexual activity: Yes    Partners: Male    Birth control/protection: Post-menopausal  Other Topics Concern  . Not on file  Social History Narrative  . Not on file   Social Determinants of Health   Financial Resource Strain:   . Difficulty of Paying Living Expenses:   Food Insecurity:   . Worried About Charity fundraiser in the Last Year:   . Academic librarian in the Last Year:   Transportation Needs:   . Film/video editor (Medical):   Marland Kitchen Lack of Transportation (Non-Medical):   Physical Activity:   . Days of Exercise per Week:   . Minutes of Exercise per Session:   Stress:   . Feeling of Stress :   Social Connections:   . Frequency of Communication with Friends and Family:   . Frequency of Social Gatherings with Friends and Family:   . Attends Religious Services:   . Active Member of Clubs or Organizations:   . Attends Archivist Meetings:   Marland Kitchen Marital Status:   Intimate Partner Violence:   . Fear of Current or Ex-Partner:   . Emotionally Abused:   Marland Kitchen Physically Abused:   . Sexually Abused:     Family History  Problem Relation Age of Onset  . Heart disease Mother   . Diabetes Maternal Grandmother   . Hypertension Maternal Grandmother   . Diabetes Maternal Grandfather   . Hypertension Maternal Grandfather   . Stroke Paternal Grandmother   . Heart attack Paternal Grandfather   . Alcohol abuse Father   . Healthy Sister   . Healthy Daughter  Immunization History  Administered Date(s) Administered  . Influenza-Unspecified 06/22/2016, 06/02/2017  . Tdap 09/20/2007    Outpatient Encounter Medications as of 04/02/2020  Medication Sig  . amantadine (SYMMETREL) 100 MG capsule Take 1 capsule (100 mg total) by mouth 2 (two) times daily.  . carbidopa-levodopa (SINEMET CR) 50-200 MG tablet Take 1 tablet by mouth 2 (two) times daily.  . carbidopa-levodopa (SINEMET IR) 25-100 MG tablet Take 1 tablet by mouth 6 (six) times daily.  . cholecalciferol (VITAMIN D3) 25 MCG (1000 UNIT) tablet Take 1,000 Units by mouth daily. Pt taking 2 a day  . DULoxetine (CYMBALTA) 30 MG capsule Take 1 capsule (30 mg total) by mouth 2 (two) times daily. TAKE 1 CAPSULE BY MOUTH FOR 1 WEEK, THEN 2 CAPSULES DAILY  . [DISCONTINUED] diazepam (VALIUM) 5 MG tablet Take 1 tablet (5 mg total) by mouth every 6 (six) hours as needed for anxiety.  .  [DISCONTINUED] Vitamin D, Ergocalciferol, (DRISDOL) 1.25 MG (50000 UT) CAPS capsule Take 1 capsule (50,000 Units total) by mouth every 7 (seven) days.  . diazepam (VALIUM) 5 MG tablet Take 1 tablet (5 mg total) by mouth every 6 (six) hours as needed for anxiety.   No facility-administered encounter medications on file as of 04/02/2020.     ROS: Pertinent positives and negatives noted in HPI. Remainder of ROS non-contributory    Allergies  Allergen Reactions  . Ciprofloxacin Other (See Comments)    SEVERE JOINT PAIN  . Pramipexole Other (See Comments)    Could not get up for 24 hours    BP 122/80 (BP Location: Left Arm, Patient Position: Sitting, Cuff Size: Normal)   Pulse 75   Temp 97.8 F (36.6 C) (Temporal)   Ht 5\' 4"  (1.626 m)   Wt 154 lb 12.8 oz (70.2 kg)   LMP 01/14/2016 (Exact Date)   SpO2 99%   BMI 26.57 kg/m   Physical Exam Constitutional:      Appearance: Normal appearance.  Cardiovascular:     Rate and Rhythm: Normal rate and regular rhythm.  Pulmonary:     Effort: Pulmonary effort is normal. No respiratory distress.     Breath sounds: Normal breath sounds.  Neurological:     Mental Status: She is alert and oriented to person, place, and time.  Psychiatric:        Mood and Affect: Mood normal.        Behavior: Behavior normal.      A/P:   1. Panic attack as reaction to stress 2. Anxiety - controlled, no recent panic attacks - database reviewed and appropriate Refill: - diazepam (VALIUM) 5 MG tablet; Take 1 tablet (5 mg total) by mouth every 6 (six) hours as needed for anxiety.  Dispense: 30 tablet; Refill: 0 - f/u in 3 mo   3. Depressed mood - improved on cymbalta 30mg  BID   This visit occurred during the SARS-CoV-2 public health emergency.  Safety protocols were in place, including screening questions prior to the visit, additional usage of staff PPE, and extensive cleaning of exam room while observing appropriate contact time as indicated for  disinfecting solutions.

## 2020-04-10 ENCOUNTER — Ambulatory Visit: Payer: Federal, State, Local not specified - PPO | Admitting: Neurology

## 2020-04-13 ENCOUNTER — Other Ambulatory Visit: Payer: Self-pay | Admitting: Neurology

## 2020-04-17 ENCOUNTER — Other Ambulatory Visit: Payer: Self-pay | Admitting: Neurology

## 2020-04-23 NOTE — Progress Notes (Signed)
Assessment/Plan:   1.  Parkinsons Disease  -Continue carbidopa/levodopa 25/100, 1 tablet 6 times per day.  Reiterated to the patient that she could take an extra carbidopa/levodopa 25/100 in the evening time if needed (had accidentally mixed this up and was taking an extra 50/200 instead, partly because the bottle instructions were incorrect when refilled).  We clarified that today.  -Stop carbidopa/levodopa 50/200 at 6 PM.    -Continue carbidopa/levodopa 50/200 at bedtime.  -Continue amantadine, 100 mg twice per day.  She has mild dyskinesia of the left foot, but that is not that bothersome. 2.  Restless leg syndrome  -Does well with carbidopa/levodopa 50/200 at bedtime 3.  GAD/MDD  -Patient under the care of Dr. Clovis Pu.  Change from her previous citalopram and Lexapro and now on Cymbalta 4.  Follow up is anticipated in the next 4-6 months, sooner should new neurologic issues arise.  Subjective:   Sarah Welch was seen today in follow up for Parkinsons disease.  My previous records were reviewed prior to todays visit as well as outside records available to me. Notes dystonia of the feet/toes with stress/emotion.  Work has been stressful. Amantadine helpful "for the most part."  No skin discoloration.  Still exercising.  There was a misunderstanding and I had meant for the patient to take an extra carbidopa/levodopa 25/100 if needed, but she ended up taking an extra carbidopa/levodopa 50/200 in the evening and then 1 at bedtime.  Pt denies falls.  Pt denies lightheadedness, near syncope.  No hallucinations.  Mood has been good.  Current prescribed movement disorder medications: Carbidopa/levodopa 25/100, 1 tablet 6 times per day  Carbidopa/levodopa 50/200 q hs (but pt now on bid) Amantadine 100 mg twice daily    PREVIOUS MEDICATIONS:  citalopram, Lexapro; Wellbutrin ALLERGIES:   Allergies  Allergen Reactions  . Ciprofloxacin Other (See Comments)    SEVERE JOINT PAIN  .  Pramipexole Other (See Comments)    Could not get up for 24 hours    CURRENT MEDICATIONS:  Outpatient Encounter Medications as of 04/24/2020  Medication Sig  . amantadine (SYMMETREL) 100 MG capsule TAKE 1 CAPSULE(100 MG) BY MOUTH TWICE DAILY  . carbidopa-levodopa (SINEMET CR) 50-200 MG tablet Take 1 tablet by mouth 2 (two) times daily.  . carbidopa-levodopa (SINEMET IR) 25-100 MG tablet TAKE 1 TABLET BY MOUTH 6 TIMES DAILY  . cholecalciferol (VITAMIN D3) 25 MCG (1000 UNIT) tablet Take 1,000 Units by mouth in the morning and at bedtime.   . diazepam (VALIUM) 5 MG tablet Take 1 tablet (5 mg total) by mouth every 6 (six) hours as needed for anxiety.  . DULoxetine (CYMBALTA) 30 MG capsule Take 1 capsule (30 mg total) by mouth 2 (two) times daily. TAKE 1 CAPSULE BY MOUTH FOR 1 WEEK, THEN 2 CAPSULES DAILY   No facility-administered encounter medications on file as of 04/24/2020.    Objective:   PHYSICAL EXAMINATION:    VITALS:   Vitals:   04/24/20 1427  BP: 127/81  Pulse: 90  SpO2: 98%  Weight: 156 lb (70.8 kg)  Height: 5\' 5"  (1.651 m)   Wt Readings from Last 3 Encounters:  04/24/20 156 lb (70.8 kg)  04/02/20 154 lb 12.8 oz (70.2 kg)  01/19/20 150 lb (68 kg)     GEN:  The patient appears stated age and is in NAD. HEENT:  Normocephalic, atraumatic.  The mucous membranes are moist. The superficial temporal arteries are without ropiness or tenderness. CV:  RRR Lungs:  CTAB  Neck/HEME:  There are no carotid bruits bilaterally.  Neurological examination:  Orientation: The patient is alert and oriented x3. Cranial nerves: There is good facial symmetry without facial hypomimia. The speech is fluent and clear. Soft palate rises symmetrically and there is no tongue deviation. Hearing is intact to conversational tone. Sensation: Sensation is intact to light touch throughout Motor: Strength is at least antigravity x4.  Movement examination: Tone: There is nl tone in the UE/LE  Abnormal  movements: very mild L foot dyskinesia Coordination:  There is no decremation with RAM's Gait and Station: The patient has no difficulty arising out of a deep-seated chair without the use of the hands. The patient's stride length is good.    I have reviewed and interpreted the following labs independently    Chemistry      Component Value Date/Time   NA 139 01/19/2020 1233   NA 141 08/14/2019 0828   K 3.9 01/19/2020 1233   CL 105 01/19/2020 1233   CO2 25 01/19/2020 1233   BUN 18 01/19/2020 1233   BUN 13 08/14/2019 0828   CREATININE 0.84 01/19/2020 1233   CREATININE 0.77 04/02/2015 0932      Component Value Date/Time   CALCIUM 9.3 01/19/2020 1233   ALKPHOS 67 08/14/2019 0828   AST 21 08/14/2019 0828   ALT 13 08/14/2019 0828   BILITOT 0.6 08/14/2019 0828       Lab Results  Component Value Date   WBC 5.6 01/19/2020   HGB 15.0 01/19/2020   HCT 43.9 01/19/2020   MCV 90.9 01/19/2020   PLT 241 01/19/2020    Lab Results  Component Value Date   TSH 0.783 08/14/2019     Total time spent on today's visit was 30 minutes, including both face-to-face time and nonface-to-face time.  Time included that spent on review of records (prior notes available to me/labs/imaging if pertinent), discussing treatment and goals, answering patient's questions and coordinating care.  Cc:  Ronnald Nian, DO

## 2020-04-24 ENCOUNTER — Ambulatory Visit: Payer: Federal, State, Local not specified - PPO | Admitting: Neurology

## 2020-04-24 ENCOUNTER — Encounter: Payer: Self-pay | Admitting: Neurology

## 2020-04-24 ENCOUNTER — Other Ambulatory Visit: Payer: Self-pay

## 2020-04-24 VITALS — BP 127/81 | HR 90 | Ht 65.0 in | Wt 156.0 lb

## 2020-04-24 DIAGNOSIS — G20 Parkinson's disease: Secondary | ICD-10-CM | POA: Diagnosis not present

## 2020-04-24 MED ORDER — CARBIDOPA-LEVODOPA 25-100 MG PO TABS: 1.0000 | ORAL_TABLET | Freq: Every day | ORAL | 0 refills | Status: DC

## 2020-04-24 MED ORDER — CARBIDOPA-LEVODOPA ER 50-200 MG PO TBCR: 1.0000 | EXTENDED_RELEASE_TABLET | Freq: Every day | ORAL | 1 refills | Status: DC

## 2020-04-24 NOTE — Patient Instructions (Signed)
Keep up the good work!  The physicians and staff at [facility] are committed to providing excellent care. You may receive a survey requesting feedback about your experience at our office. We strive to receive "very good" responses to the survey questions. If you feel that your experience would prevent you from giving the office a "very good " response, please contact our office to try to remedy the situation. We may be reached at [phone]. Thank you for taking the time out of your busy day to complete the survey.

## 2020-04-30 ENCOUNTER — Other Ambulatory Visit: Payer: Self-pay | Admitting: Neurology

## 2020-05-18 ENCOUNTER — Ambulatory Visit (INDEPENDENT_AMBULATORY_CARE_PROVIDER_SITE_OTHER): Payer: Federal, State, Local not specified - PPO | Admitting: Psychiatry

## 2020-05-18 ENCOUNTER — Other Ambulatory Visit: Payer: Self-pay

## 2020-05-18 ENCOUNTER — Encounter: Payer: Self-pay | Admitting: Psychiatry

## 2020-05-18 DIAGNOSIS — F410 Panic disorder [episodic paroxysmal anxiety] without agoraphobia: Secondary | ICD-10-CM | POA: Diagnosis not present

## 2020-05-18 DIAGNOSIS — F411 Generalized anxiety disorder: Secondary | ICD-10-CM

## 2020-05-18 DIAGNOSIS — F331 Major depressive disorder, recurrent, moderate: Secondary | ICD-10-CM

## 2020-05-18 MED ORDER — DULOXETINE HCL 30 MG PO CPEP: 30.0000 mg | ORAL_CAPSULE | Freq: Two times a day (BID) | ORAL | 1 refills | Status: DC

## 2020-05-18 NOTE — Progress Notes (Signed)
Sarah Welch [id number] [birth year] 49 y.o.  Subjective:   [name] is a 49 y.o. (DOB September 11, 1971) female.  Chief Complaint:  Chief Complaint  Patient presents with  . Follow-up    depression and anxiety    HPI Sarah Welch presents to the office today for follow-up of depression and anxiety.  01/2020 appt with the following noted and no med changes: Going good. Better motivated and she feelks better.  Occ anxiety spontaneous with diazepam helpful.  Overall anxiety level of anxiety is better and less depression.  Most improvement in the last 3 weeks with better activity.  Irritability resolved. Tolerating duloxetine prefers AM and noon.    05/18/2020 appt wit the following noted: Job still stressful but meds are fine.  Short staffed.  Stress eating.  Work out 3 times weekly and plans more.  Fears vaccine mandate will cause more employees to leave.   Diazepam once or twice weekly.  No panic.  Exercises with PD group and feels good physically.  Past Psychiatric History:  No psychiatrist or counselor. Past Psychiatric Medication Trials: Celexa, Lexapro, Wellbutrin SE,  Diazepam  Review of Systems:  Review of Systems  Cardiovascular: Negative for palpitations.  Neurological: Positive for tremors. Negative for dizziness and weakness.    Medications: I have reviewed the patient's current medications.  Current Outpatient Medications  Medication Sig Dispense Refill  . amantadine (SYMMETREL) 100 MG capsule TAKE 1 CAPSULE(100 MG) BY MOUTH TWICE DAILY 180 capsule 0  . carbidopa-levodopa (SINEMET CR) 50-200 MG tablet Take 1 tablet by mouth at bedtime. 90 tablet 1  . carbidopa-levodopa (SINEMET IR) 25-100 MG tablet Take 1 tablet by mouth 6 (six) times daily. Can take up to 7 tabs daily if needed. 540 tablet 0  . cholecalciferol (VITAMIN D3) 25 MCG (1000 UNIT) tablet Take 1,000 Units by mouth in the morning and at bedtime.     . diazepam (VALIUM) 5 MG tablet Take 1  tablet (5 mg total) by mouth every 6 (six) hours as needed for anxiety. 30 tablet 0  . DULoxetine (CYMBALTA) 30 MG capsule Take 1 capsule (30 mg total) by mouth 2 (two) times daily. TAKE 1 CAPSULE BY MOUTH FOR 1 WEEK, THEN 2 CAPSULES DAILY 180 capsule 1   No current facility-administered medications for this visit.    Medication Side Effects: None  Allergies:  Allergies  Allergen Reactions  . Ciprofloxacin Other (See Comments)    SEVERE JOINT PAIN  . Pramipexole Other (See Comments)    Could not get up for 24 hours    Past Medical History:  Diagnosis Date  . Abnormal Pap smear of cervix 1990    unsure abnormality, colpo OK  . Parkinson disease ([facility]) 12/15    Family History  Problem Relation Age of Onset  . Heart disease Mother   . Diabetes Maternal Grandmother   . Hypertension Maternal Grandmother   . Diabetes Maternal Grandfather   . Hypertension Maternal Grandfather   . Stroke Paternal Grandmother   . Heart attack Paternal Grandfather   . Alcohol abuse Father   . Healthy Sister   . Healthy Daughter     Social History   Socioeconomic History  . Marital status: Married    Spouse name: Not on file  . Number of children: 1  . Years of education: Not on file  . Highest education level: Associate degree: academic program  Occupational History  . 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 Smoking status: Former Smoker    Packs/day: 0.50    Types: Cigarettes    Quit date: 07/04/2016    Years since quitting: 3.8  . Smokeless tobacco: Never Used  Vaping Use  . Vaping Use: Never used  Substance and Sexual Activity  . Alcohol use: No    Alcohol/week: 0.0 standard drinks  . Drug use: No  . Sexual activity: Yes    Partners: Male    Birth control/protection: Post-menopausal  Other Topics Concern  . Not on file  Social History Narrative  . Not on file   Social Determinants of Health   Financial  Resource Strain:   . Difficulty of Paying Living Expenses: Not on file  Food Insecurity:   . Worried About Charity fundraiser in the Last Year: Not on file  . Ran Out of Food in the Last Year: Not on file  Transportation Needs:   . Lack of Transportation (Medical): Not on file  . Lack of Transportation (Non-Medical): Not on file  Physical Activity:   . Days of Exercise per Week: Not on file  . Minutes of Exercise per Session: Not on file  Stress:   . Feeling of Stress : Not on file  Social Connections:   . Frequency of Communication with Friends and Family: Not on file  . Frequency of Social Gatherings with Friends and Family: Not on file  . Attends Religious Services: Not on file  . Active Member of Clubs or Organizations: Not on file  . Attends Archivist Meetings: Not on file  . Marital Status: Not on file  Intimate Partner Violence:   . Fear of Current or Ex-Partner: Not on file  . Emotionally Abused: Not on file  . Physically Abused: Not on file  . Sexually Abused: Not on file    Past Medical History, Surgical history, Social history, and Family history were reviewed and updated as appropriate.   Please see review of systems for further details on the patient's review from today.   Objective:   Physical Exam:  LMP 01/14/2016 (Exact Date)   Physical Exam Constitutional:      General: She is not in acute distress. Musculoskeletal:        General: No deformity.  Neurological:     Mental Status: She is alert and oriented to person, place, and time.     Motor: No weakness.     Coordination: Coordination normal.  Psychiatric:        Attention and Perception: Attention and perception normal. She does not perceive auditory or visual hallucinations.        Mood and Affect: Mood is anxious. Mood is not depressed. Affect is not labile, blunt, angry, tearful or inappropriate.        Speech: Speech normal.        Behavior: Behavior normal.        Thought Content:  Thought content normal. Thought content is not paranoid or delusional. Thought content does not include homicidal or suicidal ideation. Thought content does not include homicidal or suicidal plan.        Cognition and Memory: Cognition and memory normal.        Judgment: Judgment normal.     Comments: Insight intac Anxiety is better but still episodes     Lab Review:     Component Value Date/Time   NA 139 01/19/2020 1233   NA 141 08/14/2019 0828   K  3.9 01/19/2020 1233   CL 105 01/19/2020 1233   CO2 25 01/19/2020 1233   GLUCOSE 121 (H) 01/19/2020 1233   BUN 18 01/19/2020 1233   BUN 13 08/14/2019 0828   CREATININE 0.84 01/19/2020 1233   CREATININE 0.77 04/02/2015 0932   CALCIUM 9.3 01/19/2020 1233   PROT 7.0 08/14/2019 0828   ALBUMIN 4.2 08/14/2019 0828   AST 21 08/14/2019 0828   ALT 13 08/14/2019 0828   ALKPHOS 67 08/14/2019 0828   BILITOT 0.6 08/14/2019 0828   GFRNONAA >60 01/19/2020 1233   GFRAA >60 01/19/2020 1233       Component Value Date/Time   WBC 5.6 01/19/2020 1233   RBC 4.83 01/19/2020 1233   HGB 15.0 01/19/2020 1233   HGB 14.6 08/14/2019 0828   HGB 13.2 04/02/2015 0905   HCT 43.9 01/19/2020 1233   HCT 43.7 08/14/2019 0828   PLT 241 01/19/2020 1233   PLT 257 08/14/2019 0828   MCV 90.9 01/19/2020 1233   MCV 94 08/14/2019 0828   MCH 31.1 01/19/2020 1233   MCHC 34.2 01/19/2020 1233   RDW 12.3 01/19/2020 1233   RDW 12.1 08/14/2019 0828   LYMPHSABS 2.1 01/19/2020 1233   MONOABS 0.5 01/19/2020 1233   EOSABS 0.2 01/19/2020 1233   BASOSABS 0.1 01/19/2020 1233    No results found for: POCLITH, LITHIUM   No results found for: PHENYTOIN, PHENOBARB, VALPROATE, CBMZ   .res Assessment: Plan:    Sarah Welch was seen today for follow-up.  Diagnoses and all orders for this visit:  Major depressive disorder, recurrent episode, moderate (HCC) -     DULoxetine (CYMBALTA) 30 MG capsule; Take 1 capsule (30 mg total) by mouth 2 (two) times daily. TAKE 1 CAPSULE BY  MOUTH FOR 1 WEEK, THEN 2 CAPSULES DAILY  Generalized anxiety disorder -     DULoxetine (CYMBALTA) 30 MG capsule; Take 1 capsule (30 mg total) by mouth 2 (two) times daily. TAKE 1 CAPSULE BY MOUTH FOR 1 WEEK, THEN 2 CAPSULES DAILY  Panic disorder without agoraphobia -     DULoxetine (CYMBALTA) 30 MG capsule; Take 1 capsule (30 mg total) by mouth 2 (two) times daily. TAKE 1 CAPSULE BY MOUTH FOR 1 WEEK, THEN Sarah Welch has the diagnoses noted above and was not well controlled on Wellbutrin and Lexapro.  Her symptoms of depression and anxiety are better with the switch to duloxetine 30 mg twice daily.  She tends to have some dizziness if she takes 60 mg at once.  Otherwise she is tolerating the current dosage.  She has had near resolution of depression and resolution of the irritability.  She still has some episodic minor anxiety which is overall improved.  She is functioning better and more active.  She is satisfied with the med response at this time.  She still takes diazepam on occasion but is not taking it as excessively.  Continue current meds  Disc SE in detail and SSRI withdrawal sx.  We discussed the short-term risks associated with benzodiazepines including sedation and increased fall risk among others.  Discussed long-term side effect risk including dependence, potential withdrawal symptoms, and the potential eventual dose-related risk of dementia.  But recent studies from 2020 dispute this association between benzodiazepines and dementia risk. Newer studies in 2020 do not support an association with dementia.  Follow-up 4-6 months and expect further gradual improvement. Call if you have any additional symptoms or problems with the medication.  Lynder Parents MD, DFAPA  Please  see After Visit Summary for patient specific instructions.  Future Appointments  Date Time Provider [place]  10/09/2020  8:15 AM Tat, Eustace Quail, DO [facility] None    No orders of the  defined types were placed in this encounter.   -------------------------------

## 2020-05-27 ENCOUNTER — Encounter: Payer: Self-pay | Admitting: Family Medicine

## 2020-05-27 DIAGNOSIS — F410 Panic disorder [episodic paroxysmal anxiety] without agoraphobia: Secondary | ICD-10-CM

## 2020-05-27 MED ORDER — DIAZEPAM 5 MG PO TABS: 5.0000 mg | ORAL_TABLET | Freq: Four times a day (QID) | ORAL | 2 refills | Status: DC | PRN

## 2020-06-12 ENCOUNTER — Ambulatory Visit: Payer: Self-pay

## 2020-07-12 ENCOUNTER — Other Ambulatory Visit: Payer: Self-pay | Admitting: Neurology

## 2020-07-13 NOTE — Telephone Encounter (Signed)
Rx(s) sent to pharmacy electronically.  

## 2020-07-19 ENCOUNTER — Other Ambulatory Visit: Payer: Self-pay | Admitting: Neurology

## 2020-08-07 ENCOUNTER — Telehealth (INDEPENDENT_AMBULATORY_CARE_PROVIDER_SITE_OTHER): Payer: Federal, State, Local not specified - PPO | Admitting: Nurse Practitioner

## 2020-08-07 ENCOUNTER — Encounter: Payer: Self-pay | Admitting: Nurse Practitioner

## 2020-08-07 VITALS — Ht 64.0 in | Wt 150.0 lb

## 2020-08-07 DIAGNOSIS — J029 Acute pharyngitis, unspecified: Secondary | ICD-10-CM

## 2020-08-07 MED ORDER — AMOXICILLIN 500 MG PO TABS: 500.0000 mg | ORAL_TABLET | Freq: Two times a day (BID) | ORAL | 0 refills | Status: AC

## 2020-08-07 NOTE — Progress Notes (Signed)
Virtual Visit via Video Note  I connected with@ on 08/07/20 at  8:00 AM EST by a video enabled telemedicine application and verified that I am speaking with the correct person using two identifiers.  Location: Patient:Home Provider: Office Participants: patient and provider  I discussed the limitations of evaluation and management by telemedicine and the availability of in person appointments. I also discussed with the patient that there may be a patient responsible charge related to this service. The patient expressed understanding and agreed to proceed.  CC:Pt c/o sore throat and ear pain x3 days. Pt has took nyquil and states it does help a little but doesn't last long. Pt was test for COVID on Wed. and it was negative.   History of Present Illness: Sore Throat  This is a new problem. The current episode started in the past 7 days. The problem has been unchanged. There has been no fever. Associated symptoms include ear pain. Pertinent negatives include no abdominal pain, congestion, coughing, drooling, ear discharge, headaches, hoarse voice, plugged ear sensation, neck pain, shortness of breath, stridor, swollen glands, trouble swallowing or vomiting. She has had no exposure to strep or mono. She has tried acetaminophen for the symptoms. The treatment provided mild relief.   Observations/Objective: Physical Exam Constitutional:      General: She is not in acute distress. Musculoskeletal:     Cervical back: Normal range of motion and neck supple.  Lymphadenopathy:     Cervical: No cervical adenopathy.  Neurological:     Mental Status: She is alert and oriented to person, place, and time.    Assessment and Plan: Carmelita was seen today for acute visit.  Diagnoses and all orders for this visit:  Acute pharyngitis, unspecified etiology -     amoxicillin (AMOXIL) 500 MG tablet; Take 1 tablet (500 mg total) by mouth 2 (two) times daily for 10 days.   Follow Up Instructions: Use  tylenol or ibuprofen for pain  office if no improvement in 1week   I discussed the assessment and treatment plan with the patient. The patient was provided an opportunity to ask questions and all were answered. The patient agreed with the plan and demonstrated an understanding of the instructions.   The patient was advised to call back or seek an in-person evaluation if the symptoms worsen or if the condition fails to improve as anticipated.   Wilfred Lacy, NP

## 2020-08-07 NOTE — Patient Instructions (Addendum)
Use tylenol or ibuprofen for pain  office if no improvement in 1week  Pharyngitis  Pharyngitis is a sore throat (pharynx). This is when there is redness, pain, and swelling in your throat. Most of the time, this condition gets better on its own. In some cases, you may need medicine. Follow these instructions at home:  Take over-the-counter and prescription medicines only as told by your doctor. ? If you were prescribed an antibiotic medicine, take it as told by your doctor. Do not stop taking the antibiotic even if you start to feel better. ? Do not give children aspirin. Aspirin has been linked to Reye syndrome.  Drink enough water and fluids to keep your pee (urine) clear or pale yellow.  Get a lot of rest.  Rinse your mouth (gargle) with a salt-water mixture 3-4 times a day or as needed. To make a salt-water mixture, completely dissolve -1 tsp of salt in 1 cup of warm water.  If your doctor approves, you may use throat lozenges or sprays to soothe your throat. Contact a doctor if:  You have large, tender lumps in your neck.  You have a rash.  You cough up green, yellow-brown, or bloody spit. Get help right away if:  You have a stiff neck.  You drool or cannot swallow liquids.  You cannot drink or take medicines without throwing up.  You have very bad pain that does not go away with medicine.  You have problems breathing, and it is not from a stuffy nose.  You have new pain and swelling in your knees, ankles, wrists, or elbows. Summary  Pharyngitis is a sore throat (pharynx). This is when there is redness, pain, and swelling in your throat.  If you were prescribed an antibiotic medicine, take it as told by your doctor. Do not stop taking the antibiotic even if you start to feel better.  Most of the time, pharyngitis gets better on its own. Sometimes, you may need medicine. This information is not intended to replace advice given to you by your health care provider.  Make sure you discuss any questions you have with your health care provider. Document Revised: 08/18/2017 Document Reviewed: 10/11/2016 Elsevier Patient Education  2020 [employer].

## 2020-08-10 ENCOUNTER — Encounter: Payer: Self-pay | Admitting: Nurse Practitioner

## 2020-08-10 DIAGNOSIS — J029 Acute pharyngitis, unspecified: Secondary | ICD-10-CM

## 2020-08-10 MED ORDER — METHYLPREDNISOLONE 4 MG PO TBPK: ORAL_TABLET | ORAL | 0 refills | Status: DC

## 2020-08-17 ENCOUNTER — Ambulatory Visit: Payer: Federal, State, Local not specified - PPO | Admitting: Certified Nurse Midwife

## 2020-08-24 ENCOUNTER — Other Ambulatory Visit: Payer: Federal, State, Local not specified - PPO

## 2020-08-24 DIAGNOSIS — Z20822 Contact with and (suspected) exposure to covid-19: Secondary | ICD-10-CM

## 2020-08-26 LAB — NOVEL CORONAVIRUS, NAA: SARS-CoV-2, NAA: NOT DETECTED

## 2020-08-26 LAB — SARS-COV-2, NAA 2 DAY TAT

## 2020-08-31 ENCOUNTER — Other Ambulatory Visit: Payer: Federal, State, Local not specified - PPO

## 2020-08-31 DIAGNOSIS — Z20822 Contact with and (suspected) exposure to covid-19: Secondary | ICD-10-CM | POA: Diagnosis not present

## 2020-09-01 LAB — SARS-COV-2, NAA 2 DAY TAT

## 2020-09-01 LAB — NOVEL CORONAVIRUS, NAA: SARS-CoV-2, NAA: NOT DETECTED

## 2020-09-03 ENCOUNTER — Other Ambulatory Visit: Payer: Federal, State, Local not specified - PPO

## 2020-09-07 ENCOUNTER — Other Ambulatory Visit: Payer: Federal, State, Local not specified - PPO

## 2020-09-21 ENCOUNTER — Other Ambulatory Visit: Payer: Federal, State, Local not specified - PPO

## 2020-09-28 ENCOUNTER — Other Ambulatory Visit: Payer: Federal, State, Local not specified - PPO

## 2020-10-05 ENCOUNTER — Other Ambulatory Visit: Payer: Federal, State, Local not specified - PPO

## 2020-10-06 ENCOUNTER — Other Ambulatory Visit: Payer: Self-pay

## 2020-10-07 MED ORDER — CARBIDOPA-LEVODOPA 25-100 MG PO TABS: 1.0000 | ORAL_TABLET | Freq: Every day | ORAL | 0 refills | Status: DC

## 2020-10-07 NOTE — Progress Notes (Signed)
Virtual Visit Via Video   The purpose of this virtual visit is to provide medical care while limiting exposure to the novel coronavirus.    Consent was obtained for video visit:  Yes.   Answered questions that patient had about telehealth interaction:  Yes.   I discussed the limitations, risks, security and privacy concerns of performing an evaluation and management service by telemedicine. I also discussed with the patient that there may be a patient responsible charge related to this service. The patient expressed understanding and agreed to proceed.  Pt location: Home Physician Location: office Name of referring provider:  Ronnald Nian, DO I connected with Sarah Welch at patients initiation/request on 10/09/2020 at  8:15 AM EST by video enabled telemedicine application and verified that I am speaking with the correct person using two identifiers. Pt MRN:  [id number] Pt DOB:  19-Dec-1970 Video Participants:  Sarah Welch;    Assessment/Plan:   1.  Parkinsons Disease  -Continue carbidopa/levodopa 25/100, 1 tablet 6 times per day. She is often taking a seven at the end of the day because of the cramping. It may or may not help.  -Continue carbidopa/levodopa 50/200 at bedtime.  -Continue amantadine, 100 mg twice per day  -Start rotigotine patch, 2 mg daily for 1 week and then increase to 4 mg daily thereafter. Discussed risk, benefits, side effects, including but not limited to compulsive behaviors and sleep attacks. Discussed potential skin irritation. She will call me with any issues. We will leave samples for her at the front desk.  2.  RLS  -Carbidopa/levodopa 50/200 at bedtime seems to help this.  3.  GAD/MDD  -On duloxetine, 30 mg twice per day.  Under the care of Dr. Clovis Pu. Subjective:   Shirely Toren was seen today in follow up for Parkinsons disease.  My previous records were reviewed prior to todays visit as well as outside records available to  me. Pt denies falls.  Pt denies lightheadedness, near syncope.  No hallucinations.  Patient seeing Dr. Clovis Pu.  Last visit was August 30.  He has her on duloxetine, 30 mg twice per day, with improvement in anxiety..  Patient emailed me on January 12 to state that right foot cramps were getting worse.  I told her to take an extra half tablet of levodopa if needed.  She reports today that she is taking an extra levodopa at night.  She has cramping and pulling of the R leg/foot, usually at 7-8 in the evening but seems to last for hours.  Current prescribed movement disorder medications: Carbidopa/levodopa 25/100, 1 tablet 6 times per day (pt now taking 7 per day) Carbidopa/levodopa 50/200 q hs (but pt now on bid) Amantadine 100 mg twice daily     PREVIOUS MEDICATIONS:  citalopram, Lexapro; Wellbutrin; azilect (no help); pramipexole (swelling, nausea)  ALLERGIES:   Allergies  Allergen Reactions   Ciprofloxacin Other (See Comments)    SEVERE JOINT PAIN   Pramipexole Other (See Comments)    Could not get up for 24 hours    CURRENT MEDICATIONS:  Outpatient Encounter Medications as of 10/09/2020  Medication Sig   amantadine (SYMMETREL) 100 MG capsule TAKE 1 CAPSULE(100 MG) BY MOUTH TWICE DAILY   carbidopa-levodopa (SINEMET CR) 50-200 MG tablet Take 1 tablet by mouth at bedtime.   carbidopa-levodopa (SINEMET IR) 25-100 MG tablet Take 1 tablet by mouth 6 (six) times daily.   diazepam (VALIUM) 5 MG tablet Take 1 tablet (5 mg total) by mouth  every 6 (six) hours as needed for anxiety.   DULoxetine (CYMBALTA) 30 MG capsule Take 1 capsule (30 mg total) by mouth 2 (two) times daily. TAKE 1 CAPSULE BY MOUTH FOR 1 WEEK, THEN 2 CAPSULES DAILY   cholecalciferol (VITAMIN D3) 25 MCG (1000 UNIT) tablet Take 1,000 Units by mouth in the morning and at bedtime.  (Patient not taking: Reported on 10/09/2020)   [DISCONTINUED] methylPREDNISolone (MEDROL DOSEPAK) 4 MG TBPK tablet Take as directed on package    No facility-administered encounter medications on file as of 10/09/2020.    Objective:   PHYSICAL EXAMINATION:    VITALS:   Vitals:   10/09/20 0757  Weight: 150 lb (68 kg)  Height: 5\' 4"  (1.626 m)    GEN:  The patient appears stated age and is in NAD.  Neurological examination:  Orientation: The patient is alert and oriented x3. Cranial nerves: There is good facial symmetry. There is no facial hypomimia.  The speech is fluent and clear. Soft palate rises symmetrically and there is no tongue deviation. Hearing is intact to conversational tone. Motor: Strength is at least antigravity x 4.   Shoulder shrug is equal and symmetric.  There is no pronator drift.  Movement examination: Tone: unable Abnormal movements: None seen, but did not see her hands at rest. Coordination:  There is no decremation with RAM's, in the upper extremities Gait and Station: Not tested   I have reviewed and interpreted the following labs independently    Chemistry      Component Value Date/Time   NA 139 01/19/2020 1233   NA 141 08/14/2019 0828   K 3.9 01/19/2020 1233   CL 105 01/19/2020 1233   CO2 25 01/19/2020 1233   BUN 18 01/19/2020 1233   BUN 13 08/14/2019 0828   CREATININE 0.84 01/19/2020 1233   CREATININE 0.77 04/02/2015 0932      Component Value Date/Time   CALCIUM 9.3 01/19/2020 1233   ALKPHOS 67 08/14/2019 0828   AST 21 08/14/2019 0828   ALT 13 08/14/2019 0828   BILITOT 0.6 08/14/2019 0828       Lab Results  Component Value Date   WBC 5.6 01/19/2020   HGB 15.0 01/19/2020   HCT 43.9 01/19/2020   MCV 90.9 01/19/2020   PLT 241 01/19/2020    Lab Results  Component Value Date   TSH 0.783 08/14/2019     Follow up Instructions      -I discussed the assessment and treatment plan with the patient. The patient was provided an opportunity to ask questions and all were answered. The patient agreed with the plan and demonstrated an understanding of the instructions.   The  patient was advised to call back or seek an in-person evaluation if the symptoms worsen or if the condition fails to improve as anticipated.    Total time spent on today's visit was 30 minutes, including both face-to-face time and nonface-to-face time.  Time included that spent on review of records (prior notes available to me/labs/imaging if pertinent), discussing treatment and goals, answering patient's questions and coordinating care.   Alonza Bogus, DO   Cc:  Ronnald Nian, DO

## 2020-10-09 ENCOUNTER — Telehealth (INDEPENDENT_AMBULATORY_CARE_PROVIDER_SITE_OTHER): Payer: Federal, State, Local not specified - PPO | Admitting: Neurology

## 2020-10-09 ENCOUNTER — Other Ambulatory Visit: Payer: Self-pay

## 2020-10-09 ENCOUNTER — Encounter: Payer: Self-pay | Admitting: Neurology

## 2020-10-09 VITALS — Ht 64.0 in | Wt 150.0 lb

## 2020-10-09 DIAGNOSIS — G20 Parkinson's disease: Secondary | ICD-10-CM

## 2020-10-09 MED ORDER — ROTIGOTINE 4 MG/24HR TD PT24: 1.0000 | MEDICATED_PATCH | Freq: Every day | TRANSDERMAL | 12 refills | Status: DC

## 2020-10-09 MED ORDER — ROTIGOTINE 2 MG/24HR TD PT24: 1.0000 | MEDICATED_PATCH | Freq: Every day | TRANSDERMAL | 12 refills | Status: DC

## 2020-10-09 MED ORDER — ROTIGOTINE 4 MG/24HR TD PT24
1.0000 | MEDICATED_PATCH | Freq: Every day | TRANSDERMAL | 1 refills | Status: DC
Start: 2020-10-09 — End: 2020-12-17

## 2020-10-09 NOTE — Addendum Note (Signed)
Addended by: Armen Pickup A on: 10/09/2020 11:54 AM   Modules accepted: Orders

## 2020-10-10 ENCOUNTER — Other Ambulatory Visit: Payer: Self-pay | Admitting: Family Medicine

## 2020-10-10 DIAGNOSIS — F419 Anxiety disorder, unspecified: Secondary | ICD-10-CM

## 2020-10-12 ENCOUNTER — Other Ambulatory Visit: Payer: Federal, State, Local not specified - PPO

## 2020-10-19 ENCOUNTER — Ambulatory Visit: Payer: Self-pay | Admitting: Obstetrics and Gynecology

## 2020-11-16 ENCOUNTER — Other Ambulatory Visit: Payer: Self-pay

## 2020-11-16 ENCOUNTER — Ambulatory Visit (INDEPENDENT_AMBULATORY_CARE_PROVIDER_SITE_OTHER): Payer: Federal, State, Local not specified - PPO | Admitting: Psychiatry

## 2020-11-16 ENCOUNTER — Encounter: Payer: Self-pay | Admitting: Psychiatry

## 2020-11-16 DIAGNOSIS — F411 Generalized anxiety disorder: Secondary | ICD-10-CM | POA: Diagnosis not present

## 2020-11-16 DIAGNOSIS — F5105 Insomnia due to other mental disorder: Secondary | ICD-10-CM | POA: Diagnosis not present

## 2020-11-16 DIAGNOSIS — F331 Major depressive disorder, recurrent, moderate: Secondary | ICD-10-CM

## 2020-11-16 DIAGNOSIS — F410 Panic disorder [episodic paroxysmal anxiety] without agoraphobia: Secondary | ICD-10-CM | POA: Diagnosis not present

## 2020-11-16 MED ORDER — TRAZODONE HCL 50 MG PO TABS: 50.0000 mg | ORAL_TABLET | Freq: Every day | ORAL | 1 refills | Status: DC

## 2020-11-16 MED ORDER — DULOXETINE HCL 30 MG PO CPEP: 30.0000 mg | ORAL_CAPSULE | Freq: Two times a day (BID) | ORAL | 1 refills | Status: DC

## 2020-11-16 NOTE — Progress Notes (Signed)
Sarah Welch [id number] 10-Dec-1970 50 y.o.  Subjective:   [name] is a 50 y.o. (DOB [birth year]) female.  Chief Complaint:  Chief Complaint  Patient presents with  . Follow-up  . Major depressive disorder, recurrent episode, moderate ([place])  . Sleeping Problem    HPI Sarah Welch presents to the office today for follow-up of depression and anxiety.  01/2020 appt with the following noted and no med changes: Going good. Better motivated and she feelks better.  Occ anxiety spontaneous with diazepam helpful.  Overall anxiety level of anxiety is better and less depression.  Most improvement in the last 3 weeks with better activity.  Irritability resolved. Tolerating duloxetine prefers AM and noon.    05/18/2020 appt wit the following noted: Job still stressful but meds are fine.  Short staffed.  Stress eating.  Work out 3 times weekly and plans more.  Fears vaccine mandate will cause more employees to leave.   Diazepam once or twice weekly.  No panic.  11/16/2020 appointment with following noted: Still on duloxetine 30 BID and rare diazepam.  PD meds without change. Sleep problems for a couple of months.  EMA and initial insomnia. Caffeine before lunch 2 cups.  Some nights only 2 hours sleep. Depression might be a little worse, but tired of not sleeping.  Anxiety is OK.  Work is better.  Average 3 hours sleep per night.  Exercises with PD group and feels good physically.  Past Psychiatric History:  No psychiatrist or counselor. Past Psychiatric Medication Trials: Celexa, Lexapro, Wellbutrin SE,  Diazepam Remote Ambien  Review of Systems:  Review of Systems  Cardiovascular: Negative for chest pain and palpitations.  Neurological: Positive for tremors. Negative for dizziness and weakness.    Medications: I have reviewed the patient's current medications.  Current Outpatient Medications  Medication Sig Dispense Refill  . amantadine  (SYMMETREL) 100 MG capsule TAKE 1 CAPSULE(100 MG) BY MOUTH TWICE DAILY 180 capsule 0  . carbidopa-levodopa (SINEMET CR) 50-200 MG tablet Take 1 tablet by mouth at bedtime. 90 tablet 1  . carbidopa-levodopa (SINEMET IR) 25-100 MG tablet Take 1 tablet by mouth 6 (six) times daily. 540 tablet 0  . diazepam (VALIUM) 5 MG tablet Take 1 tablet (5 mg total) by mouth every 6 (six) hours as needed for anxiety. 30 tablet 2  . DULoxetine (CYMBALTA) 30 MG capsule Take 1 capsule (30 mg total) by mouth 2 (two) times daily. TAKE 1 CAPSULE BY MOUTH FOR 1 WEEK, THEN 2 CAPSULES DAILY 180 capsule 1  . cholecalciferol (VITAMIN D3) 25 MCG (1000 UNIT) tablet Take 1,000 Units by mouth in the morning and at bedtime.  (Patient not taking: No sig reported)    . rotigotine (NEUPRO) 2 MG/24HR Place 1 patch onto the skin daily. Samples of this drug were given to the patient, quantity 1 kit box, Lot Number [id number] exp:7/23 (Patient not taking: Reported on 11/16/2020) 30 patch 12  . rotigotine (NEUPRO) 4 MG/24HR Place 1 patch onto the skin daily. (Patient not taking: Reported on 11/16/2020) 90 patch 1  . rotigotine (NEUPRO) 4 MG/24HR Place 1 patch onto the skin daily. Samples of this drug were given to the patient, quantity 2 boxes of lot#:[id number] exp:03/2021 and 1 box of lot#:[id number] and exp:10/23 (Patient not taking: Reported on 11/16/2020) 30 patch 12   No current facility-administered medications for this visit.    Medication Side Effects: None  Allergies:  Allergies  Allergen Reactions  . Ciprofloxacin Other (  See Comments)    SEVERE JOINT PAIN  . Pramipexole Other (See Comments)    Could not get up for 24 hours    Past Medical History:  Diagnosis Date  . Abnormal Pap smear of cervix 1990    unsure abnormality, colpo OK  . Parkinson disease ([place]) 12/15    Family History  Problem Relation Age of Onset  . Heart disease Mother   . Diabetes Maternal Grandmother   . Hypertension Maternal Grandmother   . Diabetes  Maternal Grandfather   . Hypertension Maternal Grandfather   . Stroke Paternal Grandmother   . Heart attack Paternal Grandfather   . Alcohol abuse Father   . Healthy Sister   . Healthy Daughter     Social History   Socioeconomic History  . Marital status: Married    Spouse name: Not on file  . Number of children: 1  . Years of education: Not on file  . Highest education level: Associate degree: academic program  Occupational History  . 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 Smoking status: Former Smoker    Packs/day: 0.50    Types: Cigarettes    Quit date: 07/04/2016    Years since quitting: 4.3  . Smokeless tobacco: Never Used  Vaping Use  . Vaping Use: Never used  Substance and Sexual Activity  . Alcohol use: No    Alcohol/week: 0.0 standard drinks  . Drug use: No  . Sexual activity: Yes    Partners: Male    Birth control/protection: Post-menopausal  Other Topics Concern  . Not on file  Social History Narrative  . Not on file   Social Determinants of Health   Financial Resource Strain: Not on file  Food Insecurity: Not on file  Transportation Needs: Not on file  Physical Activity: Not on file  Stress: Not on file  Social Connections: Not on file  Intimate Partner Violence: Not on file    Past Medical History, Surgical history, Social history, and Family history were reviewed and updated as appropriate.   Please see review of systems for further details on the patient's review from today.   Objective:   Physical Exam:  LMP 01/14/2016 (Exact Date)   Physical Exam Constitutional:      General: She is not in acute distress. Musculoskeletal:        General: No deformity.  Neurological:     Mental Status: She is alert and oriented to person, place, and time.     Motor: No weakness.     Coordination: Coordination normal.  Psychiatric:        Attention and Perception: Attention and  perception normal. She does not perceive auditory or visual hallucinations.        Mood and Affect: Mood is depressed. Mood is not anxious. Affect is not labile, blunt, angry, tearful or inappropriate.        Speech: Speech normal.        Behavior: Behavior normal.        Thought Content: Thought content normal. Thought content is not paranoid or delusional. Thought content does not include homicidal or suicidal ideation. Thought content does not include homicidal or suicidal plan.        Cognition and Memory: Cognition and memory normal.        Judgment: Judgment normal.     Comments: Insight intact      Lab Review:  Component Value Date/Time   NA 139 01/19/2020 1233   NA 141 08/14/2019 0828   K 3.9 01/19/2020 1233   CL 105 01/19/2020 1233   CO2 25 01/19/2020 1233   GLUCOSE 121 (H) 01/19/2020 1233   BUN 18 01/19/2020 1233   BUN 13 08/14/2019 0828   CREATININE 0.84 01/19/2020 1233   CREATININE 0.77 04/02/2015 0932   CALCIUM 9.3 01/19/2020 1233   PROT 7.0 08/14/2019 0828   ALBUMIN 4.2 08/14/2019 0828   AST 21 08/14/2019 0828   ALT 13 08/14/2019 0828   ALKPHOS 67 08/14/2019 0828   BILITOT 0.6 08/14/2019 0828   GFRNONAA >60 01/19/2020 1233   GFRAA >60 01/19/2020 1233       Component Value Date/Time   WBC 5.6 01/19/2020 1233   RBC 4.83 01/19/2020 1233   HGB 15.0 01/19/2020 1233   HGB 14.6 08/14/2019 0828   HGB 13.2 04/02/2015 0905   HCT 43.9 01/19/2020 1233   HCT 43.7 08/14/2019 0828   PLT 241 01/19/2020 1233   PLT 257 08/14/2019 0828   MCV 90.9 01/19/2020 1233   MCV 94 08/14/2019 0828   MCH 31.1 01/19/2020 1233   MCHC 34.2 01/19/2020 1233   RDW 12.3 01/19/2020 1233   RDW 12.1 08/14/2019 0828   LYMPHSABS 2.1 01/19/2020 1233   MONOABS 0.5 01/19/2020 1233   EOSABS 0.2 01/19/2020 1233   BASOSABS 0.1 01/19/2020 1233    No results found for: POCLITH, LITHIUM   No results found for: PHENYTOIN, PHENOBARB, VALPROATE, CBMZ   .res Assessment: Plan:    Haylin was  seen today for follow-up, major depressive disorder, recurrent episode, moderate (hcc) and sleeping problem.  Diagnoses and all orders for this visit:  Major depressive disorder, recurrent episode, moderate (HCC)  Generalized anxiety disorder  Panic disorder without agoraphobia  Vernida has the diagnoses noted above and was not well controlled on Wellbutrin and Lexapro.  Her symptoms of depression and anxiety are better with the switch to duloxetine 30 mg twice daily.  She tends to have some dizziness if she takes 60 mg at once.  Otherwise she is tolerating the current dosage.  She has had near resolution of depression and resolution of the irritability.  She still has some episodic minor anxiety which is overall improved.  She is functioning better and more active.  She is satisfied with the med response at this time.  She still takes diazepam on occasion but is not taking it as excessively.  Continue current duloxetine 30 BID Trial trazodone for sleep  Disc SE in detail and SSRI withdrawal sx.  We discussed the short-term risks associated with benzodiazepines including sedation and increased fall risk among others.  Discussed long-term side effect risk including dependence, potential withdrawal symptoms, and the potential eventual dose-related risk of dementia.  But recent studies from 2020 dispute this association between benzodiazepines and dementia risk. Newer studies in 2020 do not support an association with dementia.  Follow-up 6 weeks Call if you have any additional symptoms or problems with the medication.  Lynder Parents MD, DFAPA  Please see After Visit Summary for patient specific instructions.  Future Appointments  Date Time Provider [place]  12/17/2020  1:00 PM Ronnald Nian, [place] [facility]  12/28/2020  1:30 PM Nunzio Cobbs, MD [facility] None  03/15/2021  8:15 AM Tat, Eustace Quail, DO [facility] None    No orders of the defined types were placed in this  encounter.   -------------------------------

## 2020-11-24 ENCOUNTER — Other Ambulatory Visit: Payer: Self-pay

## 2020-11-24 MED ORDER — CARBIDOPA-LEVODOPA ER 50-200 MG PO TBCR: 1.0000 | EXTENDED_RELEASE_TABLET | Freq: Every day | ORAL | 0 refills | Status: DC

## 2020-11-24 NOTE — Progress Notes (Signed)
Per pt my chart message refilled Sinemt 50-200 mg.

## 2020-11-25 ENCOUNTER — Other Ambulatory Visit: Payer: Self-pay

## 2020-11-25 ENCOUNTER — Telehealth: Payer: Self-pay | Admitting: Neurology

## 2020-11-25 MED ORDER — AMANTADINE HCL 100 MG PO CAPS: 100.0000 mg | ORAL_CAPSULE | Freq: Two times a day (BID) | ORAL | 0 refills | Status: DC

## 2020-11-25 NOTE — Telephone Encounter (Signed)
Rx(s) sent to pharmacy electronically.  

## 2020-12-05 DIAGNOSIS — Z1231 Encounter for screening mammogram for malignant neoplasm of breast: Secondary | ICD-10-CM | POA: Diagnosis not present

## 2020-12-05 LAB — HM MAMMOGRAPHY

## 2020-12-08 ENCOUNTER — Encounter: Payer: Self-pay | Admitting: Family Medicine

## 2020-12-17 ENCOUNTER — Other Ambulatory Visit: Payer: Self-pay

## 2020-12-17 ENCOUNTER — Encounter: Payer: Self-pay | Admitting: Family Medicine

## 2020-12-17 ENCOUNTER — Ambulatory Visit: Payer: Federal, State, Local not specified - PPO | Admitting: Family Medicine

## 2020-12-17 VITALS — BP 110/68 | HR 65 | Temp 97.3°F | Wt 158.4 lb

## 2020-12-17 DIAGNOSIS — F410 Panic disorder [episodic paroxysmal anxiety] without agoraphobia: Secondary | ICD-10-CM

## 2020-12-17 DIAGNOSIS — F419 Anxiety disorder, unspecified: Secondary | ICD-10-CM | POA: Diagnosis not present

## 2020-12-17 DIAGNOSIS — F5104 Psychophysiologic insomnia: Secondary | ICD-10-CM

## 2020-12-17 DIAGNOSIS — F430 Acute stress reaction: Secondary | ICD-10-CM | POA: Diagnosis not present

## 2020-12-17 MED ORDER — DIAZEPAM 5 MG PO TABS: ORAL_TABLET | ORAL | 2 refills | Status: DC

## 2020-12-17 NOTE — Progress Notes (Signed)
Sarah Welch is a 50 y.o. female  Chief Complaint  Patient presents with  . Medication Refill    Pt is here for medication follow up, pt needs refill and possible increase on diazepam, and sleeping meds     HPI: Sarah Welch is a 50 y.o. female seen today for routine f/u on anxiety. She is taking cymbalta 30m BID and valium 581mPRN. She estimates she takes this at most 3x/wk. She feels like it does not work as well as it once did.  Anxiety is controlled overall, but sleep is a problem and hard to know if poor sleep increases anxiety or anxiety is causing poor sleep.  Pt is taking trazodone 502mRx'd by Dr. CotClovis Puut pt states it is not effective.   Past Medical History:  Diagnosis Date  . Abnormal Pap smear of cervix 1990    unsure abnormality, colpo OK  . [place]    Past Surgical History:  Procedure Laterality Date  . AUGMENTATION MAMMAPLASTY Bilateral 2004   implants, saline  . COLPOSCOPY    . DILATION AND CURETTAGE OF UTERUS  1992   following SAB    Social History   Socioeconomic History  . Marital status: Married    Spouse name: Not on file  . Number of children: 1  . Years of education: Not on file  . Highest education level: Associate degree: academic program  Occupational History  . 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 Smoking status: Former Smoker    Packs/day: 0.50    Types: Cigarettes    Quit date: 07/04/2016    Years since quitting: 4.4  . Smokeless tobacco: Never Used  Vaping Use  . Vaping Use: Never used  Substance and Sexual Activity  . Alcohol use: No    Alcohol/week: 0.0 standard drinks  . Drug use: No  . Sexual activity: Yes    Partners: Male    Birth control/protection: Post-menopausal  Other Topics Concern  . Not on file  Social History Narrative  . Not on file   Social Determinants of Health   Financial Resource Strain:  Not on file  Food Insecurity: Not on file  Transportation Needs: Not on file  Physical Activity: Not on file  Stress: Not on file  Social Connections: Not on file  Intimate Partner Violence: Not on file    Family History  Problem Relation Age of Onset  . Heart disease Mother   . Diabetes Maternal Grandmother   . Hypertension Maternal Grandmother   . Diabetes Maternal Grandfather   . Hypertension Maternal Grandfather   . Stroke Paternal Grandmother   . Heart attack Paternal Grandfather   . Alcohol abuse Father   . Healthy Sister   . Healthy Daughter      Immunization History  Administered Date(s) Administered  . Influenza-Unspecified 06/22/2016, 06/02/2017  . Tdap 09/20/2007    Outpatient Encounter Medications as of 12/17/2020  Medication Sig  . amantadine (SYMMETREL) 100 MG capsule Take 1 capsule (100 mg total) by mouth 2 (two) times daily.  . carbidopa-levodopa (SINEMET CR) 50-200 MG tablet Take 1 tablet by mouth at bedtime.  . carbidopa-levodopa (SINEMET IR) 25-100 MG tablet Take 1 tablet by mouth 6 (six) times daily.  . diazepam (VALIUM) 5 MG tablet Take 1 tablet (5 mg total) by mouth every 6 (six) hours as needed for anxiety.  .Marland Kitchen  DULoxetine (CYMBALTA) 30 MG capsule Take 1 capsule (30 mg total) by mouth 2 (two) times daily.  . traZODone (DESYREL) 50 MG tablet Take 1 tablet (50 mg total) by mouth at bedtime.  . [DISCONTINUED] cholecalciferol (VITAMIN D3) 25 MCG (1000 UNIT) tablet Take 1,000 Units by mouth in the morning and at bedtime.  (Patient not taking: No sig reported)  . [DISCONTINUED] rotigotine (NEUPRO) 2 MG/24HR Place 1 patch onto the skin daily. Samples of this drug were given to the patient, quantity 1 kit box, Lot Number [id number] exp:7/23 (Patient not taking: Reported on 11/16/2020)  . [DISCONTINUED] rotigotine (NEUPRO) 4 MG/24HR Place 1 patch onto the skin daily. (Patient not taking: Reported on 11/16/2020)  . [DISCONTINUED] rotigotine (NEUPRO) 4 MG/24HR Place 1  patch onto the skin daily. Samples of this drug were given to the patient, quantity 2 boxes of lot#:[id number] exp:03/2021 and 1 box of CXK#:[id number] and exp:10/23 (Patient not taking: Reported on 11/16/2020)   No facility-administered encounter medications on file as of 12/17/2020.     ROS: Pertinent positives and negatives noted in HPI. Remainder of ROS non-contributory   Allergies  Allergen Reactions  . Ciprofloxacin Other (See Comments)    SEVERE JOINT PAIN  . Pramipexole Other (See Comments)    Could not get up for 24 hours    BP 110/68 (BP Location: Left Arm, Patient Position: Sitting, Cuff Size: Normal)   Pulse 65   Temp (!) 97.3 F (36.3 C) (Temporal)   Wt 158 lb 6.4 oz (71.8 kg)   LMP 01/14/2016 (Exact Date)   SpO2 98%   BMI 27.19 kg/m   Wt Readings from Last 3 Encounters:  12/17/20 158 lb 6.4 oz (71.8 kg)  10/09/20 150 lb (68 kg)  08/07/20 150 lb (68 kg)   Temp Readings from Last 3 Encounters:  12/17/20 (!) 97.3 F (36.3 C) (Temporal)  04/02/20 97.8 F (36.6 C) (Temporal)  01/19/20 97.6 F (36.4 C) (Oral)   BP Readings from Last 3 Encounters:  12/17/20 110/68  04/24/20 127/81  04/02/20 122/80   Pulse Readings from Last 3 Encounters:  12/17/20 65  04/24/20 90  04/02/20 75     Physical Exam Constitutional:      General: She is not in acute distress.    Appearance: Normal appearance. She is not ill-appearing.  Cardiovascular:     Rate and Rhythm: Normal rate and regular rhythm.     Pulses: Normal pulses.  Pulmonary:     Effort: Pulmonary effort is normal. No respiratory distress.     Breath sounds: Normal breath sounds.  Neurological:     Mental Status: She is alert and oriented to person, place, and time.  Psychiatric:        Mood and Affect: Mood normal.        Behavior: Behavior normal.      A/P:   1. Anxiety 2. Panic attack as reaction to stress - on cymbalta 6m BID Increase: - diazepam (VALIUM) 5 MG tablet; 1.5 tabs po BID PRN   Dispense: 45 tablet; Refill: 2 - database reviewed and appropriate - f/u in 3 mo or sooner PRN  3. Psychophysiological insomnia - not controlled on trazodone 59mqHS    This visit occurred during the SARS-CoV-2 public health emergency.  Safety protocols were in place, including screening questions prior to the visit, additional usage of staff PPE, and extensive cleaning of exam room while observing appropriate contact time as indicated for disinfecting solutions.

## 2020-12-18 ENCOUNTER — Other Ambulatory Visit: Payer: Self-pay

## 2020-12-18 MED ORDER — CARBIDOPA-LEVODOPA 25-100 MG PO TABS: 1.0000 | ORAL_TABLET | Freq: Every day | ORAL | 0 refills | Status: DC

## 2020-12-18 NOTE — Progress Notes (Signed)
Per pt refill sent into the pharmacy.

## 2020-12-28 ENCOUNTER — Encounter: Payer: Self-pay | Admitting: Obstetrics and Gynecology

## 2020-12-28 ENCOUNTER — Other Ambulatory Visit: Payer: Self-pay

## 2020-12-28 ENCOUNTER — Ambulatory Visit (INDEPENDENT_AMBULATORY_CARE_PROVIDER_SITE_OTHER): Payer: Federal, State, Local not specified - PPO | Admitting: Obstetrics and Gynecology

## 2020-12-28 VITALS — BP 118/84 | HR 96 | Ht 64.5 in | Wt 157.0 lb

## 2020-12-28 DIAGNOSIS — Z01419 Encounter for gynecological examination (general) (routine) without abnormal findings: Secondary | ICD-10-CM | POA: Diagnosis not present

## 2020-12-28 MED ORDER — ESTRADIOL 0.1 MG/GM VA CREA: TOPICAL_CREAM | VAGINAL | 2 refills | Status: DC

## 2020-12-28 NOTE — Progress Notes (Signed)
50 y.o. G63P1011 Married Caucasian female here for annual exam.    Using coconut oil for vaginal dryness.  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  New grandbaby due in October.   Did not receive Covid vaccine.  Opted out.   PCP: Letta Median, DO  Patient's last menstrual period was 01/14/2016 (exact date).           Sexually active: Yes.    The current method of family planning is post menopausal status.    Exercising: Yes.    walking, weights, running, strength, core Smoker:  no  Health Maintenance: Pap: 03-30-18 Neg:Neg HR HPV, 04-02-15 Neg7-9-15 Neg:Neg HR HPV History of abnormal Pap:  Yes, Years ago hx of cryotherapy to cervix. MMG: 11/2020 normal per patient--[facility] Colonoscopy: 1-22-21polyps;next 5 years BMD:   n/a  Result  n/a TDaP: 2018 Gardasil:   no HIV: Neg in preg Hep C:2017 Neg Screening Labs:  Today.    reports that she quit smoking about 4 years ago. Her smoking use included cigarettes. She smoked 0.50 packs per day. She has never used smokeless tobacco. She reports that she does not drink alcohol and does not use drugs.  Past Medical History:  Diagnosis Date  . Abnormal Pap smear of cervix 1990   unsure abnormality, colpo OK--hx of cryotherapy to cervix  . Parkinson disease ([place]) 12/15    Past Surgical History:  Procedure Laterality Date  . AUGMENTATION MAMMAPLASTY Bilateral 2004   implants, saline  . COLPOSCOPY    . DILATION AND CURETTAGE OF UTERUS  1992   following SAB    Current Outpatient Medications  Medication Sig Dispense Refill  . amantadine (SYMMETREL) 100 MG capsule Take 1 capsule (100 mg total) by mouth 2 (two) times daily. 180 capsule 0  . carbidopa-levodopa (SINEMET CR) 50-200 MG tablet Take 1 tablet by mouth at bedtime. 90 tablet 0  . carbidopa-levodopa (SINEMET IR) 25-100 MG tablet Take 1 tablet by mouth 6 (six) times daily. 540 tablet 0  . diazepam (VALIUM) 5 MG tablet 1.5 tabs po  BID PRN 45 tablet 2  . DULoxetine (CYMBALTA) 30 MG capsule Take 1 capsule (30 mg total) by mouth 2 (two) times daily. 180 capsule 1  . traZODone (DESYREL) 50 MG tablet Take 1 tablet (50 mg total) by mouth at bedtime. 30 tablet 1   No current facility-administered medications for this visit.    Family History  Problem Relation Age of Onset  . Heart disease Mother   . Diabetes Maternal Grandmother   . Hypertension Maternal Grandmother   . Diabetes Maternal Grandfather   . Hypertension Maternal Grandfather   . Stroke Paternal Grandmother   . Heart attack Paternal Grandfather   . Alcohol abuse Father   . Healthy Sister   . Healthy Daughter     Review of Systems  All other systems reviewed and are negative.   Exam:   BP 118/84   Pulse 96   Ht 5' 4.5" (1.638 m)   Wt 157 lb (71.2 kg)   LMP 01/14/2016 (Exact Date)   SpO2 100%   BMI 26.53 kg/m     General appearance: alert, cooperative and appears stated age Head: normocephalic, without obvious abnormality, atraumatic Neck: no adenopathy, supple, symmetrical, trachea midline and thyroid normal to inspection and palpation Lungs: clear to auscultation bilaterally Breasts: bilateral implants, no masses or tenderness, No nipple retraction or dimpling, No nipple discharge or bleeding, No axillary  adenopathy Heart: regular rate and rhythm Abdomen: soft, non-tender; no masses, no organomegaly Extremities: extremities normal, atraumatic, no cyanosis or edema Skin: skin color, texture, turgor normal. No rashes or lesions Lymph nodes: cervical, supraclavicular, and axillary nodes normal. Neurologic: grossly normal  Pelvic: External genitalia:  no lesions              No abnormal inguinal nodes palpated.              Urethra:  normal appearing urethra with no masses, tenderness or lesions              Bartholins and Skenes: normal                 Vagina: normal appearing vagina with normal color and discharge, no lesions               Cervix: no lesions              Pap taken: No. Bimanual Exam:  Uterus:  normal size, contour, position, consistency, mobility, non-tender              Adnexa: no mass, fullness, tenderness              Rectal exam: Yes.  .  Confirms.              Anus:  normal sphincter tone, no lesions  Chaperone was present for exam.  Assessment:   Well woman visit with normal exam. Status post breast augmentation.  Remote history of cryotherapy to cervix, 1990. Vaginal atrophy.  Parkinson's Disease.  Plan: Mammogram screening discussed. Self breast awareness reviewed. Pap and HR HPV 2024. Guidelines for Calcium, Vitamin D, regular exercise program including cardiovascular and weight bearing exercise. Rx for vaginal Estrace cream.  I discussed potential effect on breast cancer.  Follow up annually and prn.

## 2020-12-28 NOTE — Patient Instructions (Signed)

## 2020-12-29 LAB — COMPREHENSIVE METABOLIC PANEL
AG Ratio: 1.8 (calc) (ref 1.0–2.5)
ALT: 10 U/L (ref 6–29)
AST: 18 U/L (ref 10–35)
Albumin: 4.3 g/dL (ref 3.6–5.1)
Alkaline phosphatase (APISO): 65 U/L (ref 37–153)
BUN: 14 mg/dL (ref 7–25)
CO2: 26 mmol/L (ref 20–32)
Calcium: 9.4 mg/dL (ref 8.6–10.4)
Chloride: 104 mmol/L (ref 98–110)
Creat: 0.77 mg/dL (ref 0.50–1.05)
Globulin: 2.4 g/dL (calc) (ref 1.9–3.7)
Glucose, Bld: 94 mg/dL (ref 65–99)
Potassium: 4.5 mmol/L (ref 3.5–5.3)
Sodium: 139 mmol/L (ref 135–146)
Total Bilirubin: 0.5 mg/dL (ref 0.2–1.2)
Total Protein: 6.7 g/dL (ref 6.1–8.1)

## 2020-12-29 LAB — CBC
HCT: 40.9 % (ref 35.0–45.0)
Hemoglobin: 13.8 g/dL (ref 11.7–15.5)
MCH: 30.8 pg (ref 27.0–33.0)
MCHC: 33.7 g/dL (ref 32.0–36.0)
MCV: 91.3 fL (ref 80.0–100.0)
MPV: 10.6 fL (ref 7.5–12.5)
Platelets: 256 10*3/uL (ref 140–400)
RBC: 4.48 10*6/uL (ref 3.80–5.10)
RDW: 12.1 % (ref 11.0–15.0)
WBC: 5.8 10*3/uL (ref 3.8–10.8)

## 2020-12-29 LAB — TSH: TSH: 1.02 mIU/L

## 2020-12-29 LAB — LIPID PANEL
Cholesterol: 150 mg/dL (ref <200)
HDL: 61 mg/dL (ref >=50)
LDL Cholesterol (Calc): 71 mg/dL (calc)
Non-HDL Cholesterol (Calc): 89 mg/dL (calc) (ref <130)
Total CHOL/HDL Ratio: 2.5 (calc) (ref <5.0)
Triglycerides: 95 mg/dL (ref <150)

## 2020-12-29 LAB — VITAMIN D 25 HYDROXY (VIT D DEFICIENCY, FRACTURES): Vit D, 25-Hydroxy: 26 ng/mL — ABNORMAL LOW (ref 30–100)

## 2021-01-01 ENCOUNTER — Other Ambulatory Visit: Payer: Self-pay | Admitting: Neurology

## 2021-01-12 ENCOUNTER — Encounter: Payer: Self-pay | Admitting: Psychiatry

## 2021-01-12 ENCOUNTER — Other Ambulatory Visit: Payer: Self-pay

## 2021-01-12 ENCOUNTER — Ambulatory Visit (INDEPENDENT_AMBULATORY_CARE_PROVIDER_SITE_OTHER): Payer: Federal, State, Local not specified - PPO | Admitting: Psychiatry

## 2021-01-12 DIAGNOSIS — F5105 Insomnia due to other mental disorder: Secondary | ICD-10-CM | POA: Diagnosis not present

## 2021-01-12 DIAGNOSIS — F411 Generalized anxiety disorder: Secondary | ICD-10-CM

## 2021-01-12 DIAGNOSIS — F331 Major depressive disorder, recurrent, moderate: Secondary | ICD-10-CM

## 2021-01-12 DIAGNOSIS — F410 Panic disorder [episodic paroxysmal anxiety] without agoraphobia: Secondary | ICD-10-CM | POA: Diagnosis not present

## 2021-01-12 DIAGNOSIS — F324 Major depressive disorder, single episode, in partial remission: Secondary | ICD-10-CM

## 2021-01-12 MED ORDER — TRAZODONE HCL 50 MG PO TABS: 50.0000 mg | ORAL_TABLET | Freq: Every day | ORAL | 0 refills | Status: DC

## 2021-01-12 NOTE — Progress Notes (Signed)
Sarah Welch [id number] Jan 13, 1971 50 y.o.  Subjective:   [name] is a 50 y.o. (DOB [birth year]) female.  Chief Complaint:  Chief Complaint  Patient presents with  . Follow-up  . Major depressive disorder, recurrent episode, moderate ([place])    HPI [name] presents to the office today for follow-up of depression and anxiety.  01/2020 appt with the following noted and no med changes: Going good. Better motivated and she feelks better.  Occ anxiety spontaneous with diazepam helpful.  Overall anxiety level of anxiety is better and less depression.  Most improvement in the last 3 weeks with better activity.  Irritability resolved. Tolerating duloxetine prefers AM and noon.    05/18/2020 appt wit the following noted: Job still stressful but meds are fine.  Short staffed.  Stress eating.  Work out 3 times weekly and plans more.  Fears vaccine mandate will cause more employees to leave.   Diazepam once or twice weekly.  No panic.  11/16/2020 appointment with following noted: Still on duloxetine 30 BID and rare diazepam.  PD meds without change. Sleep problems for a couple of months.  EMA and initial insomnia. Caffeine before lunch 2 cups.  Some nights only 2 hours sleep. Depression might be a little worse, but tired of not sleeping.  Anxiety is OK.  Work is better.  Average 3 hours sleep per night. Plan: start trazodone 50 and continue duloxetine 30 BID  01/12/21 appt noted:  Trazodone partly helped.  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  Don't know where it's coming from.  Not like that in years.  H notices but not complaining.  Harder to get to sleep those days.  It's gone in the morning.  Going on for 3 weeks. No history of mania.  Exercises with PD group and feels good physically. No family history of bipolar.  Past Psychiatric History:  No psychiatrist or counselor. Past  Psychiatric Medication Trials: Celexa, Lexapro, Wellbutrin SE,  Diazepam Remote Ambien  Review of Systems:  Review of Systems  Cardiovascular: Negative for chest pain and palpitations.  Neurological: Positive for tremors. Negative for dizziness and weakness.    Medications: I have reviewed the patient's current medications.  Current Outpatient Medications  Medication Sig Dispense Refill  . amantadine (SYMMETREL) 100 MG capsule Take 1 capsule (100 mg total) by mouth 2 (two) times daily. 180 capsule 0  . carbidopa-levodopa (SINEMET CR) 50-200 MG tablet Take 1 tablet by mouth at bedtime. 90 tablet 0  . carbidopa-levodopa (SINEMET IR) 25-100 MG tablet Take 1 tablet by mouth 6 (six) times daily. 540 tablet 0  . diazepam (VALIUM) 5 MG tablet 1.5 tabs po BID PRN 45 tablet 2  . DULoxetine (CYMBALTA) 30 MG capsule Take 1 capsule (30 mg total) by mouth 2 (two) times daily. 180 capsule 1  . estradiol (ESTRACE) 0.1 MG/GM vaginal cream Use 1/2 g vaginally every night for the first 2 weeks, then use 1/2 g vaginally two or three times per week as needed to maintain symptom relief. 42.5 g 2  . traZODone (DESYREL) 50 MG tablet Take 1 tablet (50 mg total) by mouth at bedtime. 30 tablet 1   No current facility-administered medications for this visit.    Medication Side Effects: None  Allergies:  Allergies  Allergen Reactions  . Ciprofloxacin Other (See Comments)    SEVERE JOINT PAIN  . Pramipexole Other (See  Comments)    Could not get up for 24 hours    Past Medical History:  Diagnosis Date  . Abnormal Pap smear of cervix 1990   unsure abnormality, colpo OK--hx of cryotherapy to cervix  . Parkinson disease ([place]) 12/15    Family History  Problem Relation Age of Onset  . Heart disease Mother   . Diabetes Maternal Grandmother   . Hypertension Maternal Grandmother   . Diabetes Maternal Grandfather   . Hypertension Maternal Grandfather   . Stroke Paternal Grandmother   . Heart attack  Paternal Grandfather   . Alcohol abuse Father   . Healthy Sister   . Healthy Daughter     Social History   Socioeconomic History  . Marital status: Married    Spouse name: Not on file  . Number of children: 1  . Years of education: Not on file  . Highest education level: Associate degree: academic program  Occupational History  . 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 Smoking status: Former Smoker    Packs/day: 0.50    Types: Cigarettes    Quit date: 07/04/2016    Years since quitting: 4.5  . Smokeless tobacco: Never Used  Vaping Use  . Vaping Use: Never used  Substance and Sexual Activity  . Alcohol use: No    Alcohol/week: 0.0 standard drinks  . Drug use: No  . Sexual activity: Yes    Partners: Male    Birth control/protection: Post-menopausal  Other Topics Concern  . Not on file  Social History Narrative  . Not on file   Social Determinants of Health   Financial Resource Strain: Not on file  Food Insecurity: Not on file  Transportation Needs: Not on file  Physical Activity: Not on file  Stress: Not on file  Social Connections: Not on file  Intimate Partner Violence: Not on file    Past Medical History, Surgical history, Social history, and Family history were reviewed and updated as appropriate.   Please see review of systems for further details on the patient's review from today.   Objective:   Physical Exam:  LMP 01/14/2016 (Exact Date)   Physical Exam Constitutional:      General: She is not in acute distress. Musculoskeletal:        General: No deformity.  Neurological:     Mental Status: She is alert and oriented to person, place, and time.     Motor: No weakness.     Coordination: Coordination normal.  Psychiatric:        Attention and Perception: Attention and perception normal. She does not perceive auditory or visual hallucinations.        Mood and Affect: Mood is  depressed. Mood is not anxious. Affect is not labile, blunt, angry, tearful or inappropriate.        Speech: Speech normal.        Behavior: Behavior normal.        Thought Content: Thought content normal. Thought content is not paranoid or delusional. Thought content does not include homicidal or suicidal ideation. Thought content does not include homicidal or suicidal plan.        Cognition and Memory: Cognition and memory normal.        Judgment: Judgment normal.     Comments: Insight intact      Lab Review:     Component Value Date/Time   NA 139  12/28/2020 1406   NA 141 08/14/2019 0828   K 4.5 12/28/2020 1406   CL 104 12/28/2020 1406   CO2 26 12/28/2020 1406   GLUCOSE 94 12/28/2020 1406   BUN 14 12/28/2020 1406   BUN 13 08/14/2019 0828   CREATININE 0.77 12/28/2020 1406   CALCIUM 9.4 12/28/2020 1406   PROT 6.7 12/28/2020 1406   PROT 7.0 08/14/2019 0828   ALBUMIN 4.2 08/14/2019 0828   AST 18 12/28/2020 1406   ALT 10 12/28/2020 1406   ALKPHOS 67 08/14/2019 0828   BILITOT 0.5 12/28/2020 1406   BILITOT 0.6 08/14/2019 0828   GFRNONAA >60 01/19/2020 1233   GFRAA >60 01/19/2020 1233       Component Value Date/Time   WBC 5.8 12/28/2020 1406   RBC 4.48 12/28/2020 1406   HGB 13.8 12/28/2020 1406   HGB 14.6 08/14/2019 0828   HGB 13.2 04/02/2015 0905   HCT 40.9 12/28/2020 1406   HCT 43.7 08/14/2019 0828   PLT 256 12/28/2020 1406   PLT 257 08/14/2019 0828   MCV 91.3 12/28/2020 1406   MCV 94 08/14/2019 0828   MCH 30.8 12/28/2020 1406   MCHC 33.7 12/28/2020 1406   RDW 12.1 12/28/2020 1406   RDW 12.1 08/14/2019 0828   LYMPHSABS 2.1 01/19/2020 1233   MONOABS 0.5 01/19/2020 1233   EOSABS 0.2 01/19/2020 1233   BASOSABS 0.1 01/19/2020 1233    No results found for: POCLITH, LITHIUM   No results found for: PHENYTOIN, PHENOBARB, VALPROATE, CBMZ   .res Assessment: Plan:    Hermine was seen today for follow-up and major depressive disorder, recurrent episode, moderate  ([facility]).  Diagnoses and all orders for this visit:  Major depressive disorder with single episode, in partial remission ([place])  Generalized anxiety disorder  Panic disorder without agoraphobia  Insomnia due to mental condition  Bennie has the diagnoses noted above and was not well controlled on Wellbutrin and Lexapro.  Her symptoms of depression and anxiety are better with the switch to duloxetine 30 mg twice daily.   Also dx PD previously tends to have some dizziness if she takes 60 mg at once.  Otherwise she is tolerating the current dosage.  She has had near resolution of depression and resolution of the irritability.  She still has some episodic minor anxiety which is overall improved.  She is functioning better and more active.  She is satisfied with the med response at this time.  Doubt bipolar but described sx and call if gets manic  She still takes diazepam on occasion but is not taking it as excessively.  Continue current duloxetine 30 BID, try moving both doses back tomorning. trazodone can increase to 100 mg if needed for sleep  Disc SE in detail and SSRI withdrawal sx.  If it fails call back  We discussed the short-term risks associated with benzodiazepines including sedation and increased fall risk among others.  Discussed long-term side effect risk including dependence, potential withdrawal symptoms, and the potential eventual dose-related risk of dementia.  But recent studies from 2020 dispute this association between benzodiazepines and dementia risk. Newer studies in 2020 do not support an association with dementia.  Follow-up 8-12 weeks Call if you have any additional symptoms or problems with the medication.  Lynder Parents MD, DFAPA  Please see After Visit Summary for patient specific instructions.  Future Appointments  Date Time Provider [place]  03/15/2021  8:15 AM Tat, Eustace Quail, DO [facility] None    No orders of the  defined types were placed in this  encounter.   -------------------------------

## 2021-02-05 ENCOUNTER — Ambulatory Visit: Payer: Federal, State, Local not specified - PPO | Admitting: Family Medicine

## 2021-02-05 ENCOUNTER — Other Ambulatory Visit: Payer: Self-pay

## 2021-02-05 ENCOUNTER — Ambulatory Visit (INDEPENDENT_AMBULATORY_CARE_PROVIDER_SITE_OTHER): Payer: Federal, State, Local not specified - PPO | Admitting: Psychiatry

## 2021-02-05 ENCOUNTER — Encounter: Payer: Self-pay | Admitting: Psychiatry

## 2021-02-05 DIAGNOSIS — F324 Major depressive disorder, single episode, in partial remission: Secondary | ICD-10-CM | POA: Diagnosis not present

## 2021-02-05 DIAGNOSIS — F410 Panic disorder [episodic paroxysmal anxiety] without agoraphobia: Secondary | ICD-10-CM

## 2021-02-05 DIAGNOSIS — F411 Generalized anxiety disorder: Secondary | ICD-10-CM

## 2021-02-05 DIAGNOSIS — F5105 Insomnia due to other mental disorder: Secondary | ICD-10-CM

## 2021-02-05 DIAGNOSIS — F4322 Adjustment disorder with anxiety: Secondary | ICD-10-CM

## 2021-02-05 NOTE — Progress Notes (Signed)
Sarah Welch [id number] 1971-05-17 51 y.o.  Subjective:   [name] is a 50 y.o. (DOB [birth year]) female.  Chief Complaint:  Chief Complaint  Patient presents with  . Follow-up  . Major depressive disorder with single episode, in partial r  . Anxiety  . Stress    HPI Sarah Welch presents to the office today for follow-up of depression and anxiety.  01/2020 appt with the following noted and no med changes: Going good. Better motivated and she feelks better.  Occ anxiety spontaneous with diazepam helpful.  Overall anxiety level of anxiety is better and less depression.  Most improvement in the last 3 weeks with better activity.  Irritability resolved. Tolerating duloxetine prefers AM and noon.    05/18/2020 appt wit the following noted: Job still stressful but meds are fine.  Short staffed.  Stress eating.  Work out 3 times weekly and plans more.  Fears vaccine mandate will cause more employees to leave.   Diazepam once or twice weekly.  No panic.  11/16/2020 appointment with following noted: Still on duloxetine 30 BID and rare diazepam.  PD meds without change. Sleep problems for a couple of months.  EMA and initial insomnia. Caffeine before lunch 2 cups.  Some nights only 2 hours sleep. Depression might be a little worse, but tired of not sleeping.  Anxiety is OK.  Work is better.  Average 3 hours sleep per night. Plan: start trazodone 50 and continue duloxetine 30 BID  01/12/21 appt noted:  Trazodone partly helped.  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  Don't know where it's coming from.  Not like that in years.  H notices but not complaining.  Harder to get to sleep those days.  It's gone in the morning.  Going on for 3 weeks. No history of mania. 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  SOB, racing heart, shakey, difficulty concentrating, fear of making mistakes.  Exercises with PD group and feels good physically. No family history of bipolar.  Past Psychiatric History:  No psychiatrist or counselor. Past Psychiatric Medication Trials: Celexa, Lexapro, Wellbutrin SE,  Diazepam Remote Ambien  Review of Systems:  Review of Systems  Cardiovascular: Negative for chest pain and palpitations.  Neurological: Positive for tremors. Negative for dizziness and weakness.  Psychiatric/Behavioral: The patient is nervous/anxious.     Medications: I have reviewed the patient's current medications.  Current Outpatient Medications  Medication Sig Dispense Refill  . amantadine (SYMMETREL) 100 MG capsule Take 1 capsule (100 mg total) by mouth 2 (two) times daily. 180 capsule 0  . carbidopa-levodopa (SINEMET CR) 50-200 MG tablet Take 1 tablet by mouth at bedtime. 90 tablet 0  . carbidopa-levodopa (SINEMET IR) 25-100 MG tablet Take 1 tablet by mouth 6 (six) times daily. 540 tablet 0  . diazepam (VALIUM) 5 MG tablet 1.5 tabs po BID PRN 45 tablet 2  . DULoxetine (CYMBALTA) 30 MG capsule Take 1 capsule (30 mg total) by mouth 2 (two) times daily. 180 capsule 1  . estradiol (ESTRACE) 0.1 MG/GM vaginal cream Use 1/2 g vaginally every night for the first 2 weeks, then use 1/2 g vaginally two or three times per week as needed to maintain symptom relief. 42.5 g 2  . traZODone (  DESYREL) 50 MG tablet Take 1-2 tablets (50-100 mg total) by mouth at bedtime. 180 tablet 0   No current facility-administered medications for this visit.    Medication Side Effects: None  Allergies:  Allergies  Allergen Reactions  . Ciprofloxacin Other (See Comments)    SEVERE JOINT PAIN  . Pramipexole Other (See Comments)    Could not get up for 24 hours    Past Medical History:  Diagnosis Date  . Abnormal Pap smear of cervix 1990   unsure abnormality, colpo OK--hx of  cryotherapy to cervix  . Parkinson disease ([place]) 12/15    Family History  Problem Relation Age of Onset  . Heart disease Mother   . Diabetes Maternal Grandmother   . Hypertension Maternal Grandmother   . Diabetes Maternal Grandfather   . Hypertension Maternal Grandfather   . Stroke Paternal Grandmother   . Heart attack Paternal Grandfather   . Alcohol abuse Father   . Healthy Sister   . Healthy Daughter     Social History   Socioeconomic History  . Marital status: Married    Spouse name: Not on file  . Number of children: 1  . Years of education: Not on file  . Highest education level: Associate degree: academic program  Occupational History  . 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 Smoking status: Former Smoker    Packs/day: 0.50    Types: Cigarettes    Quit date: 07/04/2016    Years since quitting: 4.5  . Smokeless tobacco: Never Used  Vaping Use  . Vaping Use: Never used  Substance and Sexual Activity  . Alcohol use: No    Alcohol/week: 0.0 standard drinks  . Drug use: No  . Sexual activity: Yes    Partners: Male    Birth control/protection: Post-menopausal  Other Topics Concern  . Not on file  Social History Narrative  . Not on file   Social Determinants of Health   Financial Resource Strain: Not on file  Food Insecurity: Not on file  Transportation Needs: Not on file  Physical Activity: Not on file  Stress: Not on file  Social Connections: Not on file  Intimate Partner Violence: Not on file    Past Medical History, Surgical history, Social history, and Family history were reviewed and updated as appropriate.   Please see review of systems for further details on the patient's review from today.   Objective:   Physical Exam:  LMP 01/14/2016 (Exact Date)   Physical Exam Constitutional:      General: She is not in acute distress. Musculoskeletal:        General: No deformity.   Neurological:     Mental Status: She is alert and oriented to person, place, and time.     Motor: No weakness.     Coordination: Coordination normal.  Psychiatric:        Attention and Perception: Attention and perception normal. She does not perceive auditory or visual hallucinations.        Mood and Affect: Mood is anxious and depressed. Affect is not labile, blunt, angry, tearful or inappropriate.        Speech: Speech normal.        Behavior: Behavior normal.        Thought Content: Thought content normal. Thought content is not paranoid or delusional. Thought content does not include homicidal or suicidal ideation. Thought content does  not include homicidal or suicidal plan.        Cognition and Memory: Cognition and memory normal.        Judgment: Judgment normal.     Comments: Insight intact Much more anxious.     Sarah Welch [id number] December 02, 1970 50 y.o.  Subjective:   [name] is a 50 y.o. (DOB December 27, 1970) female.  Chief Complaint:  Chief Complaint  Patient presents with  . Follow-up  . Major depressive disorder with single episode, in partial r  . Anxiety  . Stress    HPI Sarah Welch presents to the office today for follow-up of depression and anxiety.  01/2020 appt with the following noted and no med changes: Going good. Better motivated and she feelks better.  Occ anxiety spontaneous with diazepam helpful.  Overall anxiety level of anxiety is better and less depression.  Most improvement in the last 3 weeks with better activity.  Irritability resolved. Tolerating duloxetine prefers AM and noon.    05/18/2020 appt wit the following noted: Job still stressful but meds are fine.  Short staffed.  Stress eating.  Work out 3 times weekly and plans more.  Fears vaccine mandate will cause more employees to leave.   Diazepam once or twice weekly.  No panic.  11/16/2020 appointment with following noted: Still on duloxetine 30  BID and rare diazepam.  PD meds without change. Sleep problems for a couple of months.  EMA and initial insomnia. Caffeine before lunch 2 cups.  Some nights only 2 hours sleep. Depression might be a little worse, but tired of not sleeping.  Anxiety is OK.  Work is better.  Average 3 hours sleep per night. Plan: start trazodone 50 and continue duloxetine 30 BID  01/12/21 appt noted:  Trazodone partly helped.  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  Don't know where it's coming from.  Not like that in years.  H notices but not complaining.  Harder to get to sleep those days.  It's gone in the morning.  Going on for 3 weeks. No history of mania.  Plan: Continue current duloxetine 30 BID, try moving both doses back tomorning. trazodone can increase to 100 mg if needed for sleep  02/05/2021 urgent appointment scheduled at patient request: Day after seen.  M died in her sleep.  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  Old dog is dying.  Straw broke camel's back was job stressor.  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  Sleep is OK.  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  Has told people she can't do it.    Exercises with PD group and feels good physically. No family history of bipolar.  Past Psychiatric History:  No psychiatrist or counselor. Past Psychiatric Medication Trials: Celexa, Lexapro, Wellbutrin SE,  Diazepam Remote Ambien  Review of Systems:  Review of Systems  Cardiovascular: Negative for chest pain and palpitations.  Neurological: Positive for tremors. Negative for dizziness and weakness.    Medications: I have reviewed the patient's current medications.  Current Outpatient Medications  Medication Sig Dispense Refill  . amantadine (SYMMETREL) 100 MG capsule Take 1 capsule (100 mg total) by mouth 2 (two) times daily. 180 capsule 0  . carbidopa-levodopa  (SINEMET CR) 50-200 MG tablet Take 1 tablet by mouth at bedtime. [place]  tablet 0  . carbidopa-levodopa (SINEMET IR) 25-100 MG tablet Take 1 tablet by mouth 6 (six) times daily. 540 tablet 0  . diazepam (VALIUM) 5 MG tablet 1.5 tabs po BID PRN 45 tablet 2  . DULoxetine (CYMBALTA) 30 MG capsule Take 1 capsule (30 mg total) by mouth 2 (two) times daily. 180 capsule 1  . estradiol (ESTRACE) 0.1 MG/GM vaginal cream Use 1/2 g vaginally every night for the first 2 weeks, then use 1/2 g vaginally two or three times per week as needed to maintain symptom relief. 42.5 g 2  . traZODone (DESYREL) 50 MG tablet Take 1-2 tablets (50-100 mg total) by mouth at bedtime. 180 tablet 0   No current facility-administered medications for this visit.    Medication Side Effects: None  Allergies:  Allergies  Allergen Reactions  . Ciprofloxacin Other (See Comments)    SEVERE JOINT PAIN  . Pramipexole Other (See Comments)    Could not get up for 24 hours    Past Medical History:  Diagnosis Date  . Abnormal Pap smear of cervix 1990   unsure abnormality, colpo OK--hx of cryotherapy to cervix  . Parkinson disease ([place]) 12/15    Family History  Problem Relation Age of Onset  . Heart disease Mother   . Diabetes Maternal Grandmother   . Hypertension Maternal Grandmother   . Diabetes Maternal Grandfather   . Hypertension Maternal Grandfather   . Stroke Paternal Grandmother   . Heart attack Paternal Grandfather   . Alcohol abuse Father   . Healthy Sister   . Healthy Daughter     Social History   Socioeconomic History  . Marital status: Married    Spouse name: Not on file  . Number of children: 1  . Years of education: Not on file  . Highest education level: Associate degree: academic program  Occupational History  . 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 Smoking status: Former Smoker    Packs/day: 0.50    Types: Cigarettes    Quit  date: 07/04/2016    Years since quitting: 4.5  . Smokeless tobacco: Never Used  Vaping Use  . Vaping Use: Never used  Substance and Sexual Activity  . Alcohol use: No    Alcohol/week: 0.0 standard drinks  . Drug use: No  . Sexual activity: Yes    Partners: Male    Birth control/protection: Post-menopausal  Other Topics Concern  . Not on file  Social History Narrative  . Not on file   Social Determinants of Health   Financial Resource Strain: Not on file  Food Insecurity: Not on file  Transportation Needs: Not on file  Physical Activity: Not on file  Stress: Not on file  Social Connections: Not on file  Intimate Partner Violence: Not on file    Past Medical History, Surgical history, Social history, and Family history were reviewed and updated as appropriate.   Please see review of systems for further details on the patient's review from today.   Objective:   Physical Exam:  LMP 01/14/2016 (Exact Date)   Physical Exam Constitutional:      General: She is not in acute distress. Musculoskeletal:        General: No deformity.  Neurological:     Mental Status: She is alert and oriented to person, place, and time.     Motor: No weakness.     Coordination: Coordination  normal.  Psychiatric:        Attention and Perception: Attention and perception normal. She does not perceive auditory or visual hallucinations.        Mood and Affect: Mood is depressed. Mood is not anxious. Affect is not labile, blunt, angry, tearful or inappropriate.        Speech: Speech normal.        Behavior: Behavior normal.        Thought Content: Thought content normal. Thought content is not paranoid or delusional. Thought content does not include homicidal or suicidal ideation. Thought content does not include homicidal or suicidal plan.        Cognition and Memory: Cognition and memory normal.        Judgment: Judgment normal.     Comments: Insight intact      Lab Review:     Component  Value Date/Time   NA 139 12/28/2020 1406   NA 141 08/14/2019 0828   K 4.5 12/28/2020 1406   CL 104 12/28/2020 1406   CO2 26 12/28/2020 1406   GLUCOSE 94 12/28/2020 1406   BUN 14 12/28/2020 1406   BUN 13 08/14/2019 0828   CREATININE 0.77 12/28/2020 1406   CALCIUM 9.4 12/28/2020 1406   PROT 6.7 12/28/2020 1406   PROT 7.0 08/14/2019 0828   ALBUMIN 4.2 08/14/2019 0828   AST 18 12/28/2020 1406   ALT 10 12/28/2020 1406   ALKPHOS 67 08/14/2019 0828   BILITOT 0.5 12/28/2020 1406   BILITOT 0.6 08/14/2019 0828   GFRNONAA >60 01/19/2020 1233   GFRAA >60 01/19/2020 1233       Component Value Date/Time   WBC 5.8 12/28/2020 1406   RBC 4.48 12/28/2020 1406   HGB 13.8 12/28/2020 1406   HGB 14.6 08/14/2019 0828   HGB 13.2 04/02/2015 0905   HCT 40.9 12/28/2020 1406   HCT 43.7 08/14/2019 0828   PLT 256 12/28/2020 1406   PLT 257 08/14/2019 0828   MCV 91.3 12/28/2020 1406   MCV 94 08/14/2019 0828   MCH 30.8 12/28/2020 1406   MCHC 33.7 12/28/2020 1406   RDW 12.1 12/28/2020 1406   RDW 12.1 08/14/2019 0828   LYMPHSABS 2.1 01/19/2020 1233   MONOABS 0.5 01/19/2020 1233   EOSABS 0.2 01/19/2020 1233   BASOSABS 0.1 01/19/2020 1233    No results found for: POCLITH, LITHIUM   No results found for: PHENYTOIN, PHENOBARB, VALPROATE, CBMZ   .res Assessment: Plan:    Tayler was seen today for follow-up, major depressive disorder with single episode, in partial r, anxiety and stress.  Diagnoses and all orders for this visit:  Adjustment disorder with anxious mood  Generalized anxiety disorder  Panic disorder without agoraphobia  Major depressive disorder with single episode, in partial remission ([place])  Insomnia due to mental condition  Sarah Welch has the diagnoses noted above and was not well controlled on Wellbutrin and Lexapro.  Her symptoms of depression and anxiety are better with the switch to duloxetine 30 mg twice daily.   Also dx PD previously tends to have some dizziness if she  takes 60 mg at once.  Otherwise she is tolerating the current dosage.  She has had near resolution of depression and resolution of the irritability.  She still has some episodic minor anxiety which is overall improved.  She is functioning better and more active.  She is satisfied with the med response at this time.  Doubt bipolar but described sx and call if gets manic  She  still takes diazepam on occasion but is not taking it as excessively.  Continue current duloxetine 30 BID, try moving both doses back tomorning. trazodone can increase to 100 mg if needed for sleep  Disc SE in detail and SSRI withdrawal sx.  If it fails call back  We discussed the short-term risks associated with benzodiazepines including sedation and increased fall risk among others.  Discussed long-term side effect risk including dependence, potential withdrawal symptoms, and the potential eventual dose-related risk of dementia.  But recent studies from 2020 dispute this association between benzodiazepines and dementia risk. Newer studies in 2020 do not support an association with dementia.  Follow-up 8-12 weeks   Call if you have any additional symptoms or problems with the medication.  Lynder Parents MD, DFAPA  Please see After Visit Summary for patient specific instructions.  Future Appointments  Date Time Provider [place]  02/12/2021  8:30 AM Ronnald Nian, DO [facility]  03/15/2021  8:15 AM Tat, Eustace Quail, DO [facility] None  04/29/2021  3:30 PM Cottle, Billey Co., MD [facility] None    No orders of the defined types were placed in this encounter.   -------------------------------  Lab Review:     Component Value Date/Time   NA 139 12/28/2020 1406   NA 141 08/14/2019 0828   K 4.5 12/28/2020 1406   CL 104 12/28/2020 1406   CO2 26 12/28/2020 1406   GLUCOSE 94 12/28/2020 1406   BUN 14 12/28/2020 1406   BUN 13 08/14/2019 0828   CREATININE 0.77 12/28/2020 1406   CALCIUM 9.4 12/28/2020 1406    PROT 6.7 12/28/2020 1406   PROT 7.0 08/14/2019 0828   ALBUMIN 4.2 08/14/2019 0828   AST 18 12/28/2020 1406   ALT 10 12/28/2020 1406   ALKPHOS 67 08/14/2019 0828   BILITOT 0.5 12/28/2020 1406   BILITOT 0.6 08/14/2019 0828   GFRNONAA >60 01/19/2020 1233   GFRAA >60 01/19/2020 1233       Component Value Date/Time   WBC 5.8 12/28/2020 1406   RBC 4.48 12/28/2020 1406   HGB 13.8 12/28/2020 1406   HGB 14.6 08/14/2019 0828   HGB 13.2 04/02/2015 0905   HCT 40.9 12/28/2020 1406   HCT 43.7 08/14/2019 0828   PLT 256 12/28/2020 1406   PLT 257 08/14/2019 0828   MCV 91.3 12/28/2020 1406   MCV 94 08/14/2019 0828   MCH 30.8 12/28/2020 1406   MCHC 33.7 12/28/2020 1406   RDW 12.1 12/28/2020 1406   RDW 12.1 08/14/2019 0828   LYMPHSABS 2.1 01/19/2020 1233   MONOABS 0.5 01/19/2020 1233   EOSABS 0.2 01/19/2020 1233   BASOSABS 0.1 01/19/2020 1233    No results found for: POCLITH, LITHIUM   No results found for: PHENYTOIN, PHENOBARB, VALPROATE, CBMZ   .res Assessment: Plan:    Sarah Welch was seen today for follow-up, major depressive disorder with single episode, in partial r, anxiety and stress.  Diagnoses and all orders for this visit:  Adjustment disorder with anxious mood  Generalized anxiety disorder  Panic disorder without agoraphobia  Major depressive disorder with single episode, in partial remission ([place])  Insomnia due to mental condition  Sarah Welch has the diagnoses noted above and was not well controlled on Wellbutrin and Lexapro.  Her symptoms of depression and anxiety were better with the switch to duloxetine 30 mg twice daily.   Also dx PD  Otherwise she is tolerating the current dosage.  She had near resolution of depression and resolution of the  irritability.   She is satisfied with the med response at this time.  However she has encountered a crisis with the death of her mother.  In addition to managing her pre-existing depression and anxiety and Parkinson's disease she  has to take on responsibility for emptying the house of her mother who is a Ship broker and being the executor of the estate.  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  This makes it impossible for her to exercise the responsibilities of being the executor of the estate because there is no one to help out at work.  This is created tremendous anxiety as noted above. Therefore we will write her out of work for [facility] from Monday 5/23 2022 until March 23, 2021.  She still takes diazepam on occasion but is not taking it as excessively.  Continue current duloxetine 30 BID, try moving both doses back tomorning. trazodone helped after increase to 100 mg for sleep  Disc SE in detail and SSRI withdrawal sx.  If it fails call back  We discussed the short-term risks associated with benzodiazepines including sedation and increased fall risk among others.  Discussed long-term side effect risk including dependence, potential withdrawal symptoms, and the potential eventual dose-related risk of dementia.  But recent studies from 2020 dispute this association between benzodiazepines and dementia risk. Newer studies in 2020 do not support an association with dementia.  Follow-up as scheduled.  Call if you have any additional symptoms or problems with the medication.  Lynder Parents MD, DFAPA  Please see After Visit Summary for patient specific instructions.  Future Appointments  Date Time Provider [place]  02/12/2021  8:30 AM Ronnald Nian, DO [facility]  03/15/2021  8:15 AM Tat, Eustace Quail, DO [facility] None  04/29/2021  3:30 PM Cottle, Billey Co., MD [facility] None    No orders of the defined types were placed in this encounter.   -------------------------------

## 2021-02-08 ENCOUNTER — Telehealth: Payer: Self-pay | Admitting: Psychiatry

## 2021-02-08 NOTE — Telephone Encounter (Signed)
Received fax from [employer] regarding Sarah Welch. They need completion of FMLA form. Placed on Ttraci's desk.

## 2021-02-09 ENCOUNTER — Telehealth: Payer: Self-pay | Admitting: Psychiatry

## 2021-02-09 NOTE — Telephone Encounter (Signed)
Received fax from [employer] regarding Sarah Welch. Mental Health Attending Physician's Statement needs completed. Placed on Traci's desk.

## 2021-02-10 NOTE — Telephone Encounter (Signed)
Gennie brought in a [place] Attending Physicians Statement to be completed. It is from [employer]. Placed on Traci's desk.

## 2021-02-12 ENCOUNTER — Telehealth: Payer: Self-pay | Admitting: Psychiatry

## 2021-02-12 ENCOUNTER — Ambulatory Visit: Payer: Federal, State, Local not specified - PPO | Admitting: Family Medicine

## 2021-02-12 ENCOUNTER — Other Ambulatory Visit: Payer: Self-pay

## 2021-02-12 ENCOUNTER — Encounter: Payer: Self-pay | Admitting: Family Medicine

## 2021-02-12 VITALS — BP 121/80 | HR 113 | Temp 97.4°F | Ht 64.5 in | Wt 157.2 lb

## 2021-02-12 DIAGNOSIS — F430 Acute stress reaction: Secondary | ICD-10-CM

## 2021-02-12 DIAGNOSIS — F410 Panic disorder [episodic paroxysmal anxiety] without agoraphobia: Secondary | ICD-10-CM

## 2021-02-12 DIAGNOSIS — F419 Anxiety disorder, unspecified: Secondary | ICD-10-CM | POA: Diagnosis not present

## 2021-02-12 MED ORDER — DIAZEPAM 5 MG PO TABS: ORAL_TABLET | ORAL | 2 refills | Status: DC

## 2021-02-12 NOTE — Progress Notes (Signed)
Sarah Welch is a 50 y.o. female  Chief Complaint  Patient presents with  . Medication Refill    HPI: Sarah Welch is a 50 y.o. female seen today for f/u on anxiety. She continues to see Dr. Clovis Pu, had appt last week.  Mother passed away suddenly 1 mo ago and pt is the executor of the estate. 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  She is out for 6 weeks on FMLA - Dr. Clovis Pu suggested this last week and is completing the paperwork. She is taking cymbalta 30mg  BID, trazodone 50-100mg  qHS, valium 5mg  1-1.5 tab PRN. She estimates taking the valium at most once per day and does have days where she does not take it at all. She cannot recall the last time she took it BID.  Past Medical History:  Diagnosis Date  . Abnormal Pap smear of cervix 1990   unsure abnormality, colpo OK--hx of cryotherapy to cervix  . Parkinson disease ([place]) 12/15    Past Surgical History:  Procedure Laterality Date  . AUGMENTATION MAMMAPLASTY Bilateral 2004   implants, saline  . COLPOSCOPY    . DILATION AND CURETTAGE OF UTERUS  1992   following SAB    Social History   Socioeconomic History  . Marital status: Married    Spouse name: Not on file  . Number of children: 1  . Years of education: Not on file  . Highest education level: Associate degree: academic program  Occupational History  . 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 Smoking status: Former Smoker    Packs/day: 0.50    Types: Cigarettes    Quit date: 07/04/2016    Years since quitting: 4.6  . Smokeless tobacco: Never Used  Vaping Use  . Vaping Use: Never used  Substance and Sexual Activity  . Alcohol use: No    Alcohol/week: 0.0 standard drinks  . Drug use: No  . Sexual activity: Yes    Partners: Male    Birth control/protection: Post-menopausal  Other Topics  Concern  . Not on file  Social History Narrative  . Not on file   Social Determinants of Health   Financial Resource Strain: Not on file  Food Insecurity: Not on file  Transportation Needs: Not on file  Physical Activity: Not on file  Stress: Not on file  Social Connections: Not on file  Intimate Partner Violence: Not on file    Family History  Problem Relation Age of Onset  . Heart disease Mother   . Diabetes Maternal Grandmother   . Hypertension Maternal Grandmother   . Diabetes Maternal Grandfather   . Hypertension Maternal Grandfather   . Stroke Paternal Grandmother   . Heart attack Paternal Grandfather   . Alcohol abuse Father   . Healthy Sister   . Healthy Daughter      Immunization History  Administered Date(s) Administered  . Influenza-Unspecified 06/22/2016, 06/02/2017  . Tdap 09/20/2007    Outpatient Encounter Medications as of 02/12/2021  Medication Sig  . amantadine (SYMMETREL) 100 MG capsule Take 1 capsule (100 mg total) by mouth 2 (two) times daily.  . carbidopa-levodopa (SINEMET CR) 50-200 MG tablet Take 1 tablet by mouth at bedtime.  . carbidopa-levodopa (SINEMET IR) 25-100 MG tablet Take 1 tablet by mouth 6 (six) times daily.  Marland Kitchen  diazepam (VALIUM) 5 MG tablet 1.5 tabs po BID PRN  . DULoxetine (CYMBALTA) 30 MG capsule Take 1 capsule (30 mg total) by mouth 2 (two) times daily.  Marland Kitchen estradiol (ESTRACE) 0.1 MG/GM vaginal cream Use 1/2 g vaginally every night for the first 2 weeks, then use 1/2 g vaginally two or three times per week as needed to maintain symptom relief.  . traZODone (DESYREL) 50 MG tablet Take 1-2 tablets (50-100 mg total) by mouth at bedtime.  . Ergocalciferol (VITAMIN D2) 10 MCG (400 UNIT) TABS    No facility-administered encounter medications on file as of 02/12/2021.     ROS: Pertinent positives and negatives noted in HPI. Remainder of ROS non-contributory   Allergies  Allergen Reactions  . Ciprofloxacin Other (See Comments)     SEVERE JOINT PAIN  . Pramipexole Other (See Comments)    Could not get up for 24 hours    Pulse (!) 113   Temp (!) 97.4 F (36.3 C) (Temporal)   Ht 5' 4.5" (1.638 m)   Wt 157 lb 3.2 oz (71.3 kg)   LMP 01/14/2016 (Exact Date)   SpO2 99%   BMI 26.57 kg/m   Wt Readings from Last 3 Encounters:  02/12/21 157 lb 3.2 oz (71.3 kg)  12/28/20 157 lb (71.2 kg)  12/17/20 158 lb 6.4 oz (71.8 kg)   Temp Readings from Last 3 Encounters:  02/12/21 (!) 97.4 F (36.3 C) (Temporal)  12/17/20 (!) 97.3 F (36.3 C) (Temporal)  04/02/20 97.8 F (36.6 C) (Temporal)   BP Readings from Last 3 Encounters:  12/28/20 118/84  12/17/20 110/68  04/24/20 127/81   Pulse Readings from Last 3 Encounters:  02/12/21 (!) 113  12/28/20 96  12/17/20 65     Physical Exam Constitutional:      General: She is not in acute distress.    Appearance: Normal appearance. She is not ill-appearing.  Cardiovascular:     Rate and Rhythm: Normal rate and regular rhythm.     Pulses: Normal pulses.  Pulmonary:     Effort: Pulmonary effort is normal. No respiratory distress.     Breath sounds: Normal breath sounds. No wheezing or rhonchi.  Neurological:     Mental Status: She is alert and oriented to person, place, and time.  Psychiatric:        Behavior: Behavior normal.      A/P:  1. Anxiety 2. Panic attack as reaction to stress - increased symptoms since mother passed away suddenly 1 mo ago and then when staffing/support decreased at work. She is currently taking 6wks of FMLA (per Dr. Clovis Pu) - cont regular f/u with psych Dr. Clovis Pu - cont cymblata 40mg  BID - database reviewed and appropriate Refill: - diazepam (VALIUM) 5 MG tablet; 1-1.5 tabs po BID PRN  Dispense: 90 tablet; Refill: 2 - f/u in 3 mo or sooner PRN   This visit occurred during the SARS-CoV-2 public health emergency.  Safety protocols were in place, including screening questions prior to the visit, additional usage of staff PPE, and  extensive cleaning of exam room while observing appropriate contact time as indicated for disinfecting solutions.

## 2021-02-12 NOTE — Telephone Encounter (Signed)
FMLA form received placed on Traci's desk 5/27

## 2021-02-18 MED ORDER — CARBIDOPA-LEVODOPA ER 50-200 MG PO TBCR: 1.0000 | EXTENDED_RELEASE_TABLET | Freq: Every day | ORAL | 1 refills | Status: DC

## 2021-02-25 NOTE — Telephone Encounter (Signed)
This has been completed and faxed.

## 2021-03-03 ENCOUNTER — Encounter: Payer: Self-pay | Admitting: Nurse Practitioner

## 2021-03-03 ENCOUNTER — Ambulatory Visit: Payer: Federal, State, Local not specified - PPO | Admitting: Nurse Practitioner

## 2021-03-03 ENCOUNTER — Other Ambulatory Visit: Payer: Self-pay

## 2021-03-03 VITALS — BP 118/72 | HR 104 | Temp 98.1°F | Ht 64.5 in | Wt 152.0 lb

## 2021-03-03 DIAGNOSIS — B029 Zoster without complications: Secondary | ICD-10-CM | POA: Diagnosis not present

## 2021-03-03 MED ORDER — VALACYCLOVIR HCL 1 G PO TABS: 1000.0000 mg | ORAL_TABLET | Freq: Three times a day (TID) | ORAL | 0 refills | Status: AC

## 2021-03-03 NOTE — Progress Notes (Signed)
   Subjective:  Patient ID: Sarah Welch, female    DOB: [birth year]  Age: 50 y.o. MRN: [id number]  CC: Rash (Right side rash on abdomen and side)  Rash This is a new problem. The current episode started in the past 7 days. The problem has been gradually worsening since onset. The affected locations include the torso. The rash is characterized by blistering, pain and redness. It is unknown if there was an exposure to a precipitant. Associated symptoms include fatigue. Past treatments include cold compress. The treatment provided mild relief.   Reviewed past Medical, Social and Family history today.  Outpatient Medications Prior to Visit  Medication Sig Dispense Refill   amantadine (SYMMETREL) 100 MG capsule Take 1 capsule (100 mg total) by mouth 2 (two) times daily. 180 capsule 0   carbidopa-levodopa (SINEMET CR) 50-200 MG tablet Take 1 tablet by mouth at bedtime. 90 tablet 1   carbidopa-levodopa (SINEMET IR) 25-100 MG tablet Take 1 tablet by mouth 6 (six) times daily. 540 tablet 0   diazepam (VALIUM) 5 MG tablet 1-1.5 tabs po BID PRN 90 tablet 2   DULoxetine (CYMBALTA) 30 MG capsule Take 1 capsule (30 mg total) by mouth 2 (two) times daily. 180 capsule 1   Ergocalciferol (VITAMIN D2) 10 MCG (400 UNIT) TABS      estradiol (ESTRACE) 0.1 MG/GM vaginal cream Use 1/2 g vaginally every night for the first 2 weeks, then use 1/2 g vaginally two or three times per week as needed to maintain symptom relief. 42.5 g 2   traZODone (DESYREL) 50 MG tablet Take 1-2 tablets (50-100 mg total) by mouth at bedtime. 180 tablet 0   No facility-administered medications prior to visit.    ROS See HPI  Objective:  BP 118/72   Pulse (!) 104   Temp 98.1 F (36.7 C)   Ht 5' 4.5" (1.638 m)   Wt 152 lb (68.9 kg)   LMP 01/14/2016 (Exact Date)   SpO2 97%   BMI 25.69 kg/m   Physical Exam Cardiovascular:     Rate and Rhythm: Normal rate.     Pulses: Normal pulses.  Pulmonary:     Effort: Pulmonary  effort is normal.  Skin:    Findings: Erythema and rash present. Rash is macular.       Neurological:     Mental Status: She is alert and oriented to person, place, and time.    Assessment & Plan:  This visit occurred during the SARS-CoV-2 public health emergency.  Safety protocols were in place, including screening questions prior to the visit, additional usage of staff PPE, and extensive cleaning of exam room while observing appropriate contact time as indicated for disinfecting solutions.   [name] was seen today for rash.  Diagnoses and all orders for this visit:  Herpes zoster without complication -     valACYclovir (VALTREX) 1000 MG tablet; Take 1 tablet (1,000 mg total) by mouth 3 (three) times daily for 7 days.  Start valtrex as prescribed Continue cold compress as needed for pain May also use lidocaine patch OTC for pain. IF nerve pain worsens, call office for Neurontin prescription.  Problem List Items Addressed This Visit   None Visit Diagnoses     Herpes zoster without complication    -  Primary   Relevant Medications   valACYclovir (VALTREX) 1000 MG tablet       Follow-up: No follow-ups on file.  Wilfred Lacy, NP

## 2021-03-03 NOTE — Patient Instructions (Addendum)
Start valtrex as prescribed Continue cold compress as needed for pain May also use lidocaine patch OTC for pain. IF nerve pain worsens, call office for Neurontin prescription.  Shingles  Shingles, which is also known as herpes zoster, is an infection that causes apainful skin rash and fluid-filled blisters. It is caused by a virus. Shingles only develops in people who: Have had chickenpox. Have been vaccinated against chickenpox. Shingles is rare in this group. What are the causes? Shingles is caused by varicella-zoster virus. This is the same virus that causes chickenpox. After a person is exposed to the virus, it stays in the body in an inactive (dormant) state. Shingles develops if the virus is reactivated. This can happen many years after the first (initial) exposure to the virus. It is not known what causes this virus to bereactivated. What increases the risk? People who have had chickenpox or received the chickenpox vaccine are at risk for shingles. Shingles infection is more common in people who: Are older than 51 years of age. Have a weakened disease-fighting system (immune system), such as people with: HIV (human immunodeficiency virus). AIDS (acquired immunodeficiency syndrome). Cancer. Are taking medicines that weaken the immune system, such as organ transplant medicines. Are experiencing a lot of stress. What are the signs or symptoms? Early symptoms of this condition include itching, tingling, and pain in an areaon your skin. Pain may be described as burning, stabbing, or throbbing. A few days or weeks after early symptoms start, a painful red rash appears. The rash is usually on one side of the body and has a band-like or belt-like pattern. The rash eventually turns into fluid-filled blisters that break open,change into scabs, and dry up in about 2-3 weeks. At any time during the infection, you may also develop: A fever. Chills. A headache. Nausea. How is this  diagnosed? This condition is diagnosed with a skin exam. Skin or fluid samples (a culture) may be taken from the blisters before a diagnosis is made. How is this treated? The rash may last for several weeks. There is not a specific cure for this condition. Your health care provider may prescribe medicines to help you manage pain, recover more quickly, and avoid long-term problems. Medicines may include: Antiviral medicines. Anti-inflammatory medicines. Pain medicines. Anti-itching medicines (antihistamines). If the area involved is on your face, you may be referred to a specialist, such as an eye doctor (ophthalmologist) or an ear, nose, and throat (ENT) doctor (otorhinolaryngologist) to help you avoid eye problems, chronic pain, or disability. Follow these instructions at home: Medicines Take over-the-counter and prescription medicines only as told by your health care provider. Apply an anti-itch cream or numbing cream to the affected area as told by your health care provider. Relieving itching and discomfort  Apply cold, wet cloths (cold compresses) to the area of the rash or blisters as told by your health care provider. Cool baths can be soothing. Try adding baking soda or dry oatmeal to the water to reduce itching. Do not bathe in hot water. Use calamine lotion as recommended by your health care provider. This is an over-the-counter lotion that helps to relieve itchiness.  Blister and rash care Keep your rash covered with a loose bandage (dressing). Wear loose-fitting clothing to help ease the pain of material rubbing against the rash. Wash your hands with soap and water for at least 20 seconds before and after you change your dressing. If soap and water are not available, use hand sanitizer. Change your dressing as  told by your health care provider. Keep your rash and blisters clean by washing the area with mild soap and cool water as told by your health care provider. Check your rash  every day for signs of infection. Check for: More redness, swelling, or pain. Fluid or blood. Warmth. Pus or a bad smell. Do not scratch your rash or pick at your blisters. To help avoid scratching: Keep your fingernails clean and cut short. Wear gloves or mittens while you sleep, if scratching is a problem. General instructions Rest as told by your health care provider. Wash your hands often with soap and water for at least 20 seconds. If soap and water are not available, use hand sanitizer. Doing this lowers your chance of getting a bacterial skin infection. Before your blisters change into scabs, your shingles infection can cause chickenpox in people who have never had it or have never been vaccinated against it. To prevent this from happening, avoid contact with other people, especially: Babies. Pregnant women. Children who have eczema. Older people who have transplants. People who have chronic illnesses, such as cancer or AIDS. Keep all follow-up visits. This is important. How is this prevented? Getting vaccinated is the best way to prevent shingles and protect against shingles complications. If you have not been vaccinated, talk with your healthcare provider about getting the vaccine. Where to find more information Centers for Disease Control and Prevention: http://www.wolf.info/ Contact a health care provider if: Your pain is not relieved with prescribed medicines. Your pain does not get better after the rash heals. You have any of these signs of infection: More redness, swelling, or pain around the rash. Fluid or blood coming from the rash. Warmth coming from your rash. Pus or a bad smell coming from the rash. A fever. Get help right away if: The rash is on your face or nose. You have facial pain, pain around your eye area, or loss of feeling on one side of your face. You have difficulty seeing. You have ear pain or have ringing in your ear. You have a loss of taste. Your condition  gets worse. Summary Shingles, also known as herpes zoster, is an infection that causes a painful skin rash and fluid-filled blisters. This condition is diagnosed with a skin exam. Skin or fluid samples (a culture) may be taken from the blisters. Keep your rash covered with a loose bandage (dressing). Wear loose-fitting clothing to help ease the pain of material rubbing against the rash. Before your blisters change into scabs, your shingles infection can cause chickenpox in people who have never had it or have never been vaccinated against it. This information is not intended to replace advice given to you by your health care provider. Make sure you discuss any questions you have with your healthcare provider. Document Revised: 08/31/2020 Document Reviewed: 08/31/2020 Elsevier Patient Education  2022 [employer].

## 2021-03-11 NOTE — Progress Notes (Signed)
Virtual Visit Via Video   The purpose of this virtual visit is to provide medical care while limiting exposure to the novel coronavirus.    Consent was obtained for video visit:  Yes.   Answered questions that patient had about telehealth interaction:  Yes.   I discussed the limitations, risks, security and privacy concerns of performing an evaluation and management service by telemedicine. I also discussed with the patient that there may be a patient responsible charge related to this service. The patient expressed understanding and agreed to proceed.  Pt location: Home Physician Location: office Name of referring provider:  Ronnald Nian, DO I connected with Sarah Welch at patients initiation/request on 03/15/2021 at  8:15 AM EDT by video enabled telemedicine application and verified that I am speaking with the correct person using two identifiers. Pt MRN:  [id number] Pt DOB:  [birth year] Video Participants:  Sarah Welch;    Assessment/Plan:   1.  Parkinson's disease  -Continue carbidopa/levodopa 25/100, 1 tablet 6-7 times per day.  -Continue carbidopa/levodopa 50/200 at bedtime.  -Continue amantadine, 100 mg twice per day.  -We discussed that it used to be thought that levodopa would increase risk of melanoma but now it is believed that Parkinsons itself likely increases risk of melanoma. she is to get regular skin checks.  -Discussed importance of increasing exercise.  2.  Restless leg  -Did better with CR levodopa at bedtime.  3.  GAD/MDD  -Under the care of Dr. Clovis Pu.  Subjective   Patient seen today in follow-up for Parkinson's disease.  Prior records are reviewed.  Mood really has not been well since last visit.  Patient's mother passed away, and she suffered the death of her dog.  Work stress had also been increasing, so she ended up going out of work on [employer] for short period of time (going back on 03/23/21).  She is seeing psychiatry.  In regards to  Parkinson's, she has not had any falls.  We started rotigotine last visit.  Unable to afford even with coupon.   States that her Parkinsons Disease symptoms are actually better despite the stress.  Current movement d/o meds:  Carbidopa/levodopa 25/100, 1 tablet 6-7 times per day (generally 6 right now) Carbidopa/levodopa 50/200 q hs  Amantadine 100 mg twice daily  Rotigotine patch, 4 mg daily (started last visit)   PREVIOUS MEDICATIONS:  citalopram, Lexapro; Wellbutrin; azilect (no help); pramipexole (swelling, nausea); neupro (states too expensive)   Current Outpatient Medications on File Prior to Visit  Medication Sig Dispense Refill   amantadine (SYMMETREL) 100 MG capsule Take 1 capsule (100 mg total) by mouth 2 (two) times daily. 180 capsule 0   carbidopa-levodopa (SINEMET CR) 50-200 MG tablet Take 1 tablet by mouth at bedtime. 90 tablet 1   diazepam (VALIUM) 5 MG tablet 1-1.5 tabs po BID PRN 90 tablet 2   DULoxetine (CYMBALTA) 30 MG capsule Take 1 capsule (30 mg total) by mouth 2 (two) times daily. 180 capsule 1   Ergocalciferol (VITAMIN D2) 10 MCG (400 UNIT) TABS      estradiol (ESTRACE) 0.1 MG/GM vaginal cream Use 1/2 g vaginally every night for the first 2 weeks, then use 1/2 g vaginally two or three times per week as needed to maintain symptom relief. 42.5 g 2   traZODone (DESYREL) 50 MG tablet Take 1-2 tablets (50-100 mg total) by mouth at bedtime. 180 tablet 0   No current facility-administered medications on file prior to visit.  Objective   Vitals:   03/15/21 0751  Weight: 151 lb (68.5 kg)  Height: 5\' 4"  (1.626 m)   GEN:  The patient appears stated age and is in NAD.  Neurological examination:  Orientation: The patient is alert and oriented x3. Cranial nerves: There is good facial symmetry. There is no facial hypomimia.  The speech is fluent and clear.  Motor: Strength is at least antigravity x 4.   Shoulder shrug is equal and symmetric.  There is no pronator  drift.  Movement examination: Tone: unable Abnormal movements: none seen Coordination:  There is min decremation with RAM's, in the LUE Gait and Station: pt unable to show me    Follow up Instructions      -I discussed the assessment and treatment plan with the patient. The patient was provided an opportunity to ask questions and all were answered. The patient agreed with the plan and demonstrated an understanding of the instructions.   The patient was advised to call back or seek an in-person evaluation if the symptoms worsen or if the condition fails to improve as anticipated.    Alonza Bogus, DO

## 2021-03-15 ENCOUNTER — Telehealth (INDEPENDENT_AMBULATORY_CARE_PROVIDER_SITE_OTHER): Payer: Federal, State, Local not specified - PPO | Admitting: Neurology

## 2021-03-15 ENCOUNTER — Encounter: Payer: Self-pay | Admitting: Neurology

## 2021-03-15 ENCOUNTER — Other Ambulatory Visit: Payer: Self-pay

## 2021-03-15 VITALS — Ht 64.0 in | Wt 151.0 lb

## 2021-03-15 DIAGNOSIS — G20 Parkinson's disease: Secondary | ICD-10-CM

## 2021-03-15 DIAGNOSIS — F411 Generalized anxiety disorder: Secondary | ICD-10-CM

## 2021-03-15 MED ORDER — CARBIDOPA-LEVODOPA 25-100 MG PO TABS: 1.0000 | ORAL_TABLET | Freq: Every day | ORAL | 1 refills | Status: DC

## 2021-03-16 ENCOUNTER — Telehealth: Payer: Self-pay | Admitting: Psychiatry

## 2021-03-16 NOTE — Telephone Encounter (Signed)
Pt needs the return to work note asap.You had her out from 5/23-7/5

## 2021-03-16 NOTE — Telephone Encounter (Signed)
Pt called and said that she needs a letter to return to work. She is suppose to go back to work on July 5th and will need it before then. Please call pt when lettter is ready at [phone]

## 2021-03-17 NOTE — Telephone Encounter (Signed)
Pt informed

## 2021-03-17 NOTE — Telephone Encounter (Signed)
Note written and placed in admin box

## 2021-03-26 ENCOUNTER — Encounter: Payer: Self-pay | Admitting: Family Medicine

## 2021-04-29 ENCOUNTER — Encounter: Payer: Self-pay | Admitting: Psychiatry

## 2021-04-29 ENCOUNTER — Other Ambulatory Visit: Payer: Self-pay

## 2021-04-29 ENCOUNTER — Ambulatory Visit: Payer: Federal, State, Local not specified - PPO | Admitting: Psychiatry

## 2021-04-29 DIAGNOSIS — F430 Acute stress reaction: Secondary | ICD-10-CM

## 2021-04-29 DIAGNOSIS — F324 Major depressive disorder, single episode, in partial remission: Secondary | ICD-10-CM

## 2021-04-29 DIAGNOSIS — F410 Panic disorder [episodic paroxysmal anxiety] without agoraphobia: Secondary | ICD-10-CM | POA: Diagnosis not present

## 2021-04-29 DIAGNOSIS — F331 Major depressive disorder, recurrent, moderate: Secondary | ICD-10-CM

## 2021-04-29 DIAGNOSIS — F5105 Insomnia due to other mental disorder: Secondary | ICD-10-CM | POA: Diagnosis not present

## 2021-04-29 DIAGNOSIS — F411 Generalized anxiety disorder: Secondary | ICD-10-CM | POA: Diagnosis not present

## 2021-04-29 DIAGNOSIS — F419 Anxiety disorder, unspecified: Secondary | ICD-10-CM

## 2021-04-29 MED ORDER — DIAZEPAM 5 MG PO TABS: ORAL_TABLET | ORAL | 2 refills | Status: DC

## 2021-04-29 MED ORDER — DULOXETINE HCL 30 MG PO CPEP: 30.0000 mg | ORAL_CAPSULE | Freq: Two times a day (BID) | ORAL | 1 refills | Status: DC

## 2021-04-29 MED ORDER — TRAZODONE HCL 50 MG PO TABS: 50.0000 mg | ORAL_TABLET | Freq: Every day | ORAL | 1 refills | Status: DC

## 2021-04-29 NOTE — Progress Notes (Signed)
Sarah Welch [id number] [birth year] 50 y.o.  Subjective:   [name] is a 50 y.o. (DOB [birth year]) female.  Chief Complaint:  Chief Complaint  Patient presents with   Follow-up   Depression   Anxiety   Sleeping Problem    HPI [name] presents to the office today for follow-up of depression and anxiety.  01/2020 appt with the following noted and no med changes: Going good. Better motivated and she feelks better.  Occ anxiety spontaneous with diazepam helpful.  Overall anxiety level of anxiety is better and less depression.  Most improvement in the last 3 weeks with better activity.  Irritability resolved. Tolerating duloxetine prefers AM and noon.    05/18/2020 appt wit the following noted: Job still stressful but meds are fine.  Short staffed.  Stress eating.  Work out 3 times weekly and plans more.  Fears vaccine mandate will cause more employees to leave.   Diazepam once or twice weekly.  No panic.  11/16/2020 appointment with following noted: Still on duloxetine 30 BID and rare diazepam.  PD meds without change. Sleep problems for a couple of months.  EMA and initial insomnia. Caffeine before lunch 2 cups.  Some nights only 2 hours sleep. Depression might be a little worse, but tired of not sleeping.  Anxiety is OK.  Work is better.  Average 3 hours sleep per night. Plan: start trazodone 50 and continue duloxetine 30 BID  01/12/21 appt noted:  Trazodone partly helped.  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  Don't know where it's coming from.  Not like that in years.  H notices but not complaining.  Harder to get to sleep those days.  It's gone in the morning.  Going on for 3 weeks. No history of mania. 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  SOB,  racing heart, shakey, difficulty concentrating, fear of making mistakes.  07/2021 appointment with the following noted:   Exercises with PD group and feels good physically. No family history of bipolar.  Past Psychiatric History:  No psychiatrist or counselor. Past Psychiatric Medication Trials: Celexa, Lexapro, Wellbutrin SE,  Diazepam Remote Ambien  Review of Systems:  Review of Systems  Cardiovascular:  Negative for chest pain and palpitations.  Neurological:  Positive for tremors. Negative for dizziness and weakness.  Psychiatric/Behavioral:  The patient is nervous/anxious.    Medications: I have reviewed the patient's current medications.  Current Outpatient Medications  Medication Sig Dispense Refill   amantadine (SYMMETREL) 100 MG capsule Take 1 capsule (100 mg total) by mouth 2 (two) times daily. 180 capsule 0   carbidopa-levodopa (SINEMET CR) 50-200 MG tablet Take 1 tablet by mouth at bedtime. 90 tablet 1   carbidopa-levodopa (SINEMET IR) 25-100 MG tablet Take 1 tablet by mouth 6 (six) times daily. 540 tablet 1   Ergocalciferol (VITAMIN D2) 10 MCG (400 UNIT) TABS      estradiol (ESTRACE) 0.1 MG/GM vaginal cream Use 1/2 g vaginally every night for the first 2 weeks, then use 1/2 g vaginally two or three times per week as needed to maintain symptom relief. 42.5 g 2   diazepam (VALIUM) 5 MG tablet 1-1.5 tabs po BID PRN 90 tablet 2   DULoxetine (CYMBALTA) 30 MG capsule Take 1  capsule (30 mg total) by mouth 2 (two) times daily. 180 capsule 1   traZODone (DESYREL) 50 MG tablet Take 1-2 tablets (50-100 mg total) by mouth at bedtime. 180 tablet 1   No current facility-administered medications for this visit.    Medication Side Effects: None  Allergies:  Allergies  Allergen Reactions   Ciprofloxacin Other (See Comments)    SEVERE JOINT PAIN Other reaction(s): Unknown   Pramipexole Other (See Comments)    Could not get up for 24 hours   Oxycodone-Acetaminophen     Other  reaction(s): Unknown    Past Medical History:  Diagnosis Date   Abnormal Pap smear of cervix 1990   unsure abnormality, colpo OK--hx of cryotherapy to cervix   Parkinson disease ([place]) 12/15    Family History  Problem Relation Age of Onset   Heart disease Mother    Alcohol abuse Father    Healthy Sister    Diabetes Maternal Grandmother    Hypertension Maternal Grandmother    Diabetes Maternal Grandfather    Hypertension Maternal Grandfather    Stroke Paternal Grandmother    Heart attack Paternal Grandfather    Healthy Daughter     Social History   Socioeconomic History   Marital status: Married    Spouse name: Not on file   Number of children: 1   Years of education: Not on file   Highest education level: Associate degree: academic program  Occupational History   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[employer]    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  Tobacco Use   Smoking status: Former    Packs/day: 0.50    Types: Cigarettes    Quit date: 07/04/2016    Years since quitting: 4.8   Smokeless tobacco: Never  Vaping Use   Vaping Use: Never used  Substance and Sexual Activity   Alcohol use: No    Alcohol/week: 0.0 standard drinks   Drug use: No   Sexual activity: Yes    Partners: Male    Birth control/protection: Post-menopausal  Other Topics Concern   Not on file  Social History Narrative   Not on file   Social Determinants of Health   Financial Resource Strain: Not on file  Food Insecurity: Not on file  Transportation Needs: Not on file  Physical Activity: Not on file  Stress: Not on file  Social Connections: Not on file  Intimate Partner Violence: Not on file    Past Medical History, Surgical history, Social history, and Family history were reviewed and updated as appropriate.   Please see review of systems for further details on the patient's review from today.   Objective:   Physical Exam:  LMP 01/14/2016 (Exact Date)   Physical  Exam Constitutional:      General: She is not in acute distress. Musculoskeletal:        General: No deformity.  Neurological:     Mental Status: She is alert and oriented to person, place, and time.     Motor: No weakness.     Coordination: Coordination normal.  Psychiatric:        Attention and Perception: Attention and perception normal. She does not perceive auditory or visual hallucinations.        Mood and Affect: Mood is anxious and depressed. Affect is not labile, blunt, angry, tearful or inappropriate.        Speech: Speech normal.        Behavior: Behavior normal. Behavior is not slowed.  Thought Content: Thought content normal. Thought content is not paranoid or delusional. Thought content does not include homicidal or suicidal ideation. Thought content does not include homicidal or suicidal plan.        Cognition and Memory: Cognition and memory normal.        Judgment: Judgment normal.     Comments: Insight intact Stressed but less panic residual anhedonia and depresssion not severe    Trudell Frisque [id number] 04-04-1971 50 y.o.  Subjective:   Patient ID:  Lakysha Rivello is a 50 y.o. (DOB June 05, 1971) female.  Chief Complaint:  Chief Complaint  Patient presents with   Follow-up   Depression   Anxiety   Sleeping Problem    HPI Ishrat Romos presents to the office today for follow-up of depression and anxiety.  01/2020 appt with the following noted and no med changes: Going good. Better motivated and she feelks better.  Occ anxiety spontaneous with diazepam helpful.  Overall anxiety level of anxiety is better and less depression.  Most improvement in the last 3 weeks with better activity.  Irritability resolved. Tolerating duloxetine prefers AM and noon.    05/18/2020 appt wit the following noted: Job still stressful but meds are fine.  Short staffed.  Stress eating.  Work out 3 times weekly and plans more.  Fears vaccine mandate will  cause more employees to leave.   Diazepam once or twice weekly.  No panic.  11/16/2020 appointment with following noted: Still on duloxetine 30 BID and rare diazepam.  PD meds without change. Sleep problems for a couple of months.  EMA and initial insomnia. Caffeine before lunch 2 cups.  Some nights only 2 hours sleep. Depression might be a little worse, but tired of not sleeping.  Anxiety is OK.  Work is better.  Average 3 hours sleep per night. Plan: start trazodone 50 and continue duloxetine 30 BID  01/12/21 appt noted:  Trazodone partly helped.  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  Don't know where it's coming from.  Not like that in years.  H notices but not complaining.  Harder to get to sleep those days.  It's gone in the morning.  Going on for 3 weeks. No history of mania.  Plan: Continue current duloxetine 30 BID, try moving both doses back tomorning. trazodone can increase to 100 mg if needed for sleep  02/05/2021 urgent appointment scheduled at patient request: Day after seen.  M died in her sleep.  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  Old dog is dying.  Straw broke camel's back was job stressor.  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  Sleep is OK.  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  Has told people she can't do it.    04/29/21 Average diazepam about 4 days per week. Continues duloxetine 30 BID, Trazodone 50-100 HS. No SE. Still dealing with estate things and work stress.  Got off 6 weeks from work but not STD.   Function at work seems OK.  Still gets anxiety attacks with SOB at work about once daily but fights through it.  Still short staffed at work. Sleep 10-530 usually. Not able to find much enjoyment usually.   Feels she is overextended and that makes it hard to relax into after hour activites.  Hope when estate is  resolved things will get  better.  Exercises with PD group and feels good physically. No family history of bipolar.  Past Psychiatric History:  No psychiatrist or counselor. Past Psychiatric Medication Trials: Celexa, Lexapro, Wellbutrin SE,  Diazepam Remote Ambien  Review of Systems:  Review of Systems  Cardiovascular: Negative for chest pain and palpitations.  Neurological: Positive for tremors. Negative for dizziness and weakness.    Medications: I have reviewed the patient's current medications.  Current Outpatient Medications  Medication Sig Dispense Refill   amantadine (SYMMETREL) 100 MG capsule Take 1 capsule (100 mg total) by mouth 2 (two) times daily. 180 capsule 0   carbidopa-levodopa (SINEMET CR) 50-200 MG tablet Take 1 tablet by mouth at bedtime. 90 tablet 1   carbidopa-levodopa (SINEMET IR) 25-100 MG tablet Take 1 tablet by mouth 6 (six) times daily. 540 tablet 1   Ergocalciferol (VITAMIN D2) 10 MCG (400 UNIT) TABS      estradiol (ESTRACE) 0.1 MG/GM vaginal cream Use 1/2 g vaginally every night for the first 2 weeks, then use 1/2 g vaginally two or three times per week as needed to maintain symptom relief. 42.5 g 2   diazepam (VALIUM) 5 MG tablet 1-1.5 tabs po BID PRN 90 tablet 2   DULoxetine (CYMBALTA) 30 MG capsule Take 1 capsule (30 mg total) by mouth 2 (two) times daily. 180 capsule 1   traZODone (DESYREL) 50 MG tablet Take 1-2 tablets (50-100 mg total) by mouth at bedtime. 180 tablet 1   No current facility-administered medications for this visit.    Medication Side Effects: None  Allergies:  Allergies  Allergen Reactions   Ciprofloxacin Other (See Comments)    SEVERE JOINT PAIN Other reaction(s): Unknown   Pramipexole Other (See Comments)    Could not get up for 24 hours   Oxycodone-Acetaminophen     Other reaction(s): Unknown    Past Medical History:  Diagnosis Date   Abnormal Pap smear of cervix 1990   unsure abnormality, colpo OK--hx of cryotherapy to cervix   Parkinson  disease ([place]) 12/15    Family History  Problem Relation Age of Onset   Heart disease Mother    Alcohol abuse Father    Healthy Sister    Diabetes Maternal Grandmother    Hypertension Maternal Grandmother    Diabetes Maternal Grandfather    Hypertension Maternal Grandfather    Stroke Paternal Grandmother    Heart attack Paternal Grandfather    Healthy Daughter     Social History   Socioeconomic History   Marital status: Married    Spouse name: Not on file   Number of children: 1   Years of education: Not on file   Highest education level: Associate degree: academic program  Occupational History   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[employer]    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  Tobacco Use   Smoking status: Former    Packs/day: 0.50    Types: Cigarettes    Quit date: 07/04/2016    Years since quitting: 4.8   Smokeless tobacco: Never  Vaping Use   Vaping Use: Never used  Substance and Sexual Activity   Alcohol use: No    Alcohol/week: 0.0 standard drinks   Drug use: No   Sexual activity: Yes    Partners: Male    Birth control/protection: Post-menopausal  Other Topics Concern   Not on file  Social History Narrative   Not on file   Social Determinants of Health  Financial Resource Strain: Not on file  Food Insecurity: Not on file  Transportation Needs: Not on file  Physical Activity: Not on file  Stress: Not on file  Social Connections: Not on file  Intimate Partner Violence: Not on file    Past Medical History, Surgical history, Social history, and Family history were reviewed and updated as appropriate.   Please see review of systems for further details on the patient's review from today.   Objective:   Physical Exam:  LMP 01/14/2016 (Exact Date)   Physical Exam Constitutional:      General: She is not in acute distress. Musculoskeletal:        General: No deformity.  Neurological:     Mental Status: She is alert and oriented to  person, place, and time.     Motor: No weakness.     Coordination: Coordination normal.  Psychiatric:        Attention and Perception: Attention and perception normal. She does not perceive auditory or visual hallucinations.        Mood and Affect: Mood is depressed. Mood is not anxious. Affect is not labile, blunt, angry, tearful or inappropriate.        Speech: Speech normal.        Behavior: Behavior normal.        Thought Content: Thought content normal. Thought content is not paranoid or delusional. Thought content does not include homicidal or suicidal ideation. Thought content does not include homicidal or suicidal plan.        Cognition and Memory: Cognition and memory normal.        Judgment: Judgment normal.     Comments: Insight intact      Lab Review:     Component Value Date/Time   NA 139 12/28/2020 1406   NA 141 08/14/2019 0828   K 4.5 12/28/2020 1406   CL 104 12/28/2020 1406   CO2 26 12/28/2020 1406   GLUCOSE 94 12/28/2020 1406   BUN 14 12/28/2020 1406   BUN 13 08/14/2019 0828   CREATININE 0.77 12/28/2020 1406   CALCIUM 9.4 12/28/2020 1406   PROT 6.7 12/28/2020 1406   PROT 7.0 08/14/2019 0828   ALBUMIN 4.2 08/14/2019 0828   AST 18 12/28/2020 1406   ALT 10 12/28/2020 1406   ALKPHOS 67 08/14/2019 0828   BILITOT 0.5 12/28/2020 1406   BILITOT 0.6 08/14/2019 0828   GFRNONAA >60 01/19/2020 1233   GFRAA >60 01/19/2020 1233       Component Value Date/Time   WBC 5.8 12/28/2020 1406   RBC 4.48 12/28/2020 1406   HGB 13.8 12/28/2020 1406   HGB 14.6 08/14/2019 0828   HGB 13.2 04/02/2015 0905   HCT 40.9 12/28/2020 1406   HCT 43.7 08/14/2019 0828   PLT 256 12/28/2020 1406   PLT 257 08/14/2019 0828   MCV 91.3 12/28/2020 1406   MCV 94 08/14/2019 0828   MCH 30.8 12/28/2020 1406   MCHC 33.7 12/28/2020 1406   RDW 12.1 12/28/2020 1406   RDW 12.1 08/14/2019 0828   LYMPHSABS 2.1 01/19/2020 1233   MONOABS 0.5 01/19/2020 1233   EOSABS 0.2 01/19/2020 1233   BASOSABS  0.1 01/19/2020 1233    No results found for: POCLITH, LITHIUM   No results found for: PHENYTOIN, PHENOBARB, VALPROATE, CBMZ   .res Assessment: Plan:    Sarah Welch was seen today for follow-up, depression, anxiety and sleeping problem.  Diagnoses and all orders for this visit:  Generalized anxiety disorder -  DULoxetine (CYMBALTA) 30 MG capsule; Take 1 capsule (30 mg total) by mouth 2 (two) times daily.  Panic disorder without agoraphobia -     DULoxetine (CYMBALTA) 30 MG capsule; Take 1 capsule (30 mg total) by mouth 2 (two) times daily.  Major depressive disorder with single episode, in partial remission (HCC)  Insomnia due to mental condition -     traZODone (DESYREL) 50 MG tablet; Take 1-2 tablets (50-100 mg total) by mouth at bedtime.  Major depressive disorder, recurrent episode, moderate (HCC) -     DULoxetine (CYMBALTA) 30 MG capsule; Take 1 capsule (30 mg total) by mouth 2 (two) times daily.  Anxiety -     diazepam (VALIUM) 5 MG tablet; 1-1.5 tabs po BID PRN  Panic attack as reaction to stress -     diazepam (VALIUM) 5 MG tablet; 1-1.5 tabs po BID PRN Domitila has the diagnoses noted above and was not well controlled on Wellbutrin and Lexapro.  Her symptoms of depression and anxiety are better with the switch to duloxetine 30 mg twice daily.   Also dx PD previously tends to have some dizziness if she takes 60 mg at once.  Otherwise she is tolerating the current dosage.  She has had near resolution of depression and resolution of the irritability.  She still has some episodic minor anxiety which is overall improved.  She is functioning better and more active.  She is satisfied with the med response at this time.  Doubt bipolar but described sx and call if gets manic  She still takes diazepam on occasion but is not taking it as excessively.  Needs better balance with work and stress and it should get better after estate.  Disc recent FMLA and no more is currently available  to her.  Disc ways to balance this as much as possible.  Continue current duloxetine 30 BID, try moving both doses back tomorning. trazodone can increase to 100 mg if needed for sleep  Disc SE in detail and SSRI withdrawal sx.  If it fails call back  We discussed the short-term risks associated with benzodiazepines including sedation and increased fall risk among others.  Discussed long-term side effect risk including dependence, potential withdrawal symptoms, and the potential eventual dose-related risk of dementia.  But recent studies from 2020 dispute this association between benzodiazepines and dementia risk. Newer studies in 2020 do not support an association with dementia.  Follow-up 3-4 mos  Call if you have any additional symptoms or problems with the medication.  Lynder Parents MD, DFAPA  Please see After Visit Summary for patient specific instructions.  Future Appointments  Date Time Provider [place]  06/07/2021  8:40 AM Copland, Gay Filler, MD [facility]  07/28/2021  9:00 AM Cottle, Billey Co., MD [facility] None  09/28/2021  8:15 AM Tat, Eustace Quail, DO [facility] None    No orders of the defined types were placed in this encounter.   -------------------------------  Lab Review:     Component Value Date/Time   NA 139 12/28/2020 1406   NA 141 08/14/2019 0828   K 4.5 12/28/2020 1406   CL 104 12/28/2020 1406   CO2 26 12/28/2020 1406   GLUCOSE 94 12/28/2020 1406   BUN 14 12/28/2020 1406   BUN 13 08/14/2019 0828   CREATININE 0.77 12/28/2020 1406   CALCIUM 9.4 12/28/2020 1406   PROT 6.7 12/28/2020 1406   PROT 7.0 08/14/2019 0828   ALBUMIN 4.2 08/14/2019 0828   AST 18 12/28/2020 1406  ALT 10 12/28/2020 1406   ALKPHOS 67 08/14/2019 0828   BILITOT 0.5 12/28/2020 1406   BILITOT 0.6 08/14/2019 0828   GFRNONAA >60 01/19/2020 1233   GFRAA >60 01/19/2020 1233       Component Value Date/Time   WBC 5.8 12/28/2020 1406   RBC 4.48 12/28/2020 1406   HGB 13.8 12/28/2020  1406   HGB 14.6 08/14/2019 0828   HGB 13.2 04/02/2015 0905   HCT 40.9 12/28/2020 1406   HCT 43.7 08/14/2019 0828   PLT 256 12/28/2020 1406   PLT 257 08/14/2019 0828   MCV 91.3 12/28/2020 1406   MCV 94 08/14/2019 0828   MCH 30.8 12/28/2020 1406   MCHC 33.7 12/28/2020 1406   RDW 12.1 12/28/2020 1406   RDW 12.1 08/14/2019 0828   LYMPHSABS 2.1 01/19/2020 1233   MONOABS 0.5 01/19/2020 1233   EOSABS 0.2 01/19/2020 1233   BASOSABS 0.1 01/19/2020 1233    No results found for: POCLITH, LITHIUM   No results found for: PHENYTOIN, PHENOBARB, VALPROATE, CBMZ   .res Assessment: Plan:    Sarah Welch was seen today for follow-up, depression, anxiety and sleeping problem.  Diagnoses and all orders for this visit:  Generalized anxiety disorder -     DULoxetine (CYMBALTA) 30 MG capsule; Take 1 capsule (30 mg total) by mouth 2 (two) times daily.  Panic disorder without agoraphobia -     DULoxetine (CYMBALTA) 30 MG capsule; Take 1 capsule (30 mg total) by mouth 2 (two) times daily.  Major depressive disorder with single episode, in partial remission (HCC)  Insomnia due to mental condition -     traZODone (DESYREL) 50 MG tablet; Take 1-2 tablets (50-100 mg total) by mouth at bedtime.  Major depressive disorder, recurrent episode, moderate (HCC) -     DULoxetine (CYMBALTA) 30 MG capsule; Take 1 capsule (30 mg total) by mouth 2 (two) times daily.  Anxiety -     diazepam (VALIUM) 5 MG tablet; 1-1.5 tabs po BID PRN  Panic attack as reaction to stress -     diazepam (VALIUM) 5 MG tablet; 1-1.5 tabs po BID PRN Sarah Welch has the diagnoses noted above and was not well controlled on Wellbutrin and Lexapro.  Her symptoms of depression and anxiety were better with the switch to duloxetine 30 mg twice daily.   Also dx PD  Otherwise she is tolerating the current dosage.  She had near resolution of depression and resolution of the irritability.   She is satisfied with the med response at this time.  However  she has encountered a crisis with the death of her mother.  In addition to managing her pre-existing depression and anxiety and Parkinson's disease she has to take on responsibility for emptying the house of her mother who is a Ship broker and being the executor of the estate.  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  This makes it impossible for her to exercise the responsibilities of being the executor of the estate because there is no one to help out at work.  This is created tremendous anxiety as noted above. Therefore we will write her out of work for [facility] from Monday 5/23 2022 until March 23, 2021.  She still takes diazepam on occasion but is not taking it as excessively.  Continue current duloxetine 30 BID, try moving both doses back tomorning. trazodone helped after increase to 100 mg for sleep  Disc SE in detail and SSRI withdrawal sx.  If it fails call back  We discussed the short-term risks associated with benzodiazepines including sedation and increased fall risk among others.  Discussed long-term side effect risk including dependence, potential withdrawal symptoms, and the potential eventual dose-related risk of dementia.  But recent studies from 2020 dispute this association between benzodiazepines and dementia risk. Newer studies in 2020 do not support an association with dementia.  Follow-up as scheduled.  Call if you have any additional symptoms or problems with the medication.  Lynder Parents MD, DFAPA  Please see After Visit Summary for patient specific instructions.  Future Appointments  Date Time Provider [place]  06/07/2021  8:40 AM Copland, Gay Filler, MD [facility]  07/28/2021  9:00 AM Cottle, Billey Co., MD [facility] None  09/28/2021  8:15 AM Tat, Eustace Quail, DO [facility] None    No orders of the defined types were placed in this encounter.   -------------------------------

## 2021-05-12 ENCOUNTER — Other Ambulatory Visit: Payer: Self-pay

## 2021-05-12 ENCOUNTER — Encounter: Payer: Self-pay | Admitting: Family Medicine

## 2021-05-12 ENCOUNTER — Ambulatory Visit: Payer: Federal, State, Local not specified - PPO | Admitting: Family Medicine

## 2021-05-12 ENCOUNTER — Ambulatory Visit: Payer: Federal, State, Local not specified - PPO | Admitting: Nurse Practitioner

## 2021-05-12 VITALS — BP 122/84 | HR 77 | Temp 96.8°F | Ht 64.0 in | Wt 150.0 lb

## 2021-05-12 DIAGNOSIS — D229 Melanocytic nevi, unspecified: Secondary | ICD-10-CM

## 2021-05-12 NOTE — Progress Notes (Signed)
[facility] [address] [place] [address] Dept: [phone] Dept Fax: [phone]  Office Visit  Subjective:    Patient ID: Sarah Welch, female    DOB: [birth year], 50 y.o..   MRN: [id number]  Chief Complaint  Patient presents with   Rash    Pt c/o rash on left breast, no swelling or burning.     History of Present Illness:  Patient is in today with a lesion having appears on her left breast a few weeks ago. This has not been resolving, so she presents for assessment. There has been no associated itching or pain.  Past Medical History: Patient Active Problem List   Diagnosis Date Noted   Acute right-sided low back pain without sciatica 09/22/2016   Allergy to pollen 04/07/2016   Headache 04/07/2016   Bronchitis 10/02/2015   Acute non-recurrent frontal sinusitis 10/02/2015   Restless leg 12/23/2014   Anxiety 12/10/2014   Parkinson's disease ([place]) 08/19/2014   Cervical radiculopathy 07/29/2014   Leg cramps 07/29/2014   Hemiparesis ([place]) 07/01/2014   Abnormal involuntary movement 07/01/2014   Tremor of left hand 07/01/2014   Dysfunctional uterine bleeding 06/08/2014   Past Surgical History:  Procedure Laterality Date   AUGMENTATION MAMMAPLASTY Bilateral 2004   implants, saline   COLPOSCOPY     DILATION AND CURETTAGE OF UTERUS  1992   following SAB   Family History  Problem Relation Age of Onset   Heart disease Mother    Alcohol abuse Father    Healthy Sister    Diabetes Maternal Grandmother    Hypertension Maternal Grandmother    Diabetes Maternal Grandfather    Hypertension Maternal Grandfather    Stroke Paternal Grandmother    Heart attack Paternal Grandfather    Healthy Daughter    Outpatient Medications Prior to Visit  Medication Sig Dispense Refill   amantadine (SYMMETREL) 100 MG capsule Take 1 capsule (100 mg total) by mouth 2 (two) times daily. 180 capsule 0   carbidopa-levodopa  (SINEMET CR) 50-200 MG tablet Take 1 tablet by mouth at bedtime. 90 tablet 1   carbidopa-levodopa (SINEMET IR) 25-100 MG tablet Take 1 tablet by mouth 6 (six) times daily. 540 tablet 1   diazepam (VALIUM) 5 MG tablet 1-1.5 tabs po BID PRN 90 tablet 2   DULoxetine (CYMBALTA) 30 MG capsule Take 1 capsule (30 mg total) by mouth 2 (two) times daily. 180 capsule 1   Ergocalciferol (VITAMIN D2) 10 MCG (400 UNIT) TABS      estradiol (ESTRACE) 0.1 MG/GM vaginal cream Use 1/2 g vaginally every night for the first 2 weeks, then use 1/2 g vaginally two or three times per week as needed to maintain symptom relief. 42.5 g 2   traZODone (DESYREL) 50 MG tablet Take 1-2 tablets (50-100 mg total) by mouth at bedtime. 180 tablet 1   No facility-administered medications prior to visit.   Allergies  Allergen Reactions   Ciprofloxacin Other (See Comments)    SEVERE JOINT PAIN Other reaction(s): Unknown   Pramipexole Other (See Comments)    Could not get up for 24 hours   Oxycodone-Acetaminophen     Other reaction(s): Unknown    Objective:   Today's Vitals   05/12/21 1600  BP: 122/84  Pulse: 77  Temp: (!) 96.8 F (36 C)  TempSrc: Temporal  SpO2: 99%  Weight: 150 lb (68 kg)  Height: '5\' 4"'$  (1.626 m)   Body mass index is 25.75 kg/m.  General: Well developed, well nourished. No acute distress. Skin: Warm and dry. There is a 1 cm irregular flat-topped, raised papule noted on the skin of the lower, inner   breast. The color is a mottled pink. There are some mild irregularities to the surface. No induration or nodular   changes. Psych: Alert and oriented. Normal mood and affect.  Health Maintenance Due  Topic Date Due   COVID-19 Vaccine (1) Never done   HIV Screening  Never done   TETANUS/TDAP  09/19/2017   Zoster Vaccines- Shingrix (1 of 2) Never done   PAP SMEAR-Modifier  03/30/2021   INFLUENZA VACCINE  04/19/2021     Assessment & Plan:   1. Fibrous papule of skin I suspect this may be a  seborrheic keratosis, even though it does not have a brown coloration at this point. I will send her to dermatology for confirmation.  - Ambulatory referral to Dermatology    Haydee Salter, MD

## 2021-05-29 NOTE — Patient Instructions (Addendum)
Nice to meet you today !  I am sorry you have had such a tough year I will be in touch with your labs asap I do recommend getting the Shingrix vaccine series eventually and the new bivalent covid vaccine when you can

## 2021-05-29 NOTE — Progress Notes (Addendum)
[facility] [place], [place], [place] [address] [phone]  Date:  06/07/2021   Name:  Sarah Welch   DOB:  Mar 03, 1971   MRN:  [id number]  PCP:  Darreld Mclean, MD    Chief Complaint: Transitions Of Care (From Dr Crigliano/Concerns/ questions: none in particular/Flu: [employer] employee- will get through work)   History of Present Illness:  Sarah Welch is a 50 y.o. very pleasant female patient who presents with the following:  Pt here today to establish and transfer care Former pt of Dr Bryan Lemma who has left practice for now   She has one daughter who is expecting her 3rd daughter in about a month- this is a bright spot for her   History of parkinson disease- Dr Tat Her sx are getting a bit worse over the last several months Her mother passed away in 2023-01-18 suddenly- pt has been getting all of her mom's estate in order and they have been very short- staffed at work.  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  Not getting much exercise right now- she got out of the habit but plans to start back  Patient Active Problem List   Diagnosis Date Noted   Acute right-sided low back pain without sciatica 09/22/2016   Allergy to pollen 04/07/2016   Headache 04/07/2016   Bronchitis 10/02/2015   Acute non-recurrent frontal sinusitis 10/02/2015   Restless leg 12/23/2014   Anxiety 12/10/2014   Parkinson's disease ([place]) 08/19/2014   Cervical radiculopathy 07/29/2014   Leg cramps 07/29/2014   Hemiparesis ([place])  07/01/2014   Abnormal involuntary movement 07/01/2014   Tremor of left hand 07/01/2014   Dysfunctional uterine bleeding 06/08/2014    Past Medical History:  Diagnosis Date   Abnormal Pap smear of cervix 1990   unsure abnormality, colpo OK--hx of cryotherapy to cervix   Parkinson disease ([place]) 12/15    Past Surgical History:  Procedure Laterality Date   AUGMENTATION MAMMAPLASTY Bilateral 2004   implants, saline   COLPOSCOPY     DILATION AND CURETTAGE OF UTERUS  1992   following SAB    Social History   Tobacco Use   Smoking status: Former    Packs/day: 0.50    Types: Cigarettes    Quit date: 07/04/2016    Years since quitting: 4.9   Smokeless tobacco: Never  Vaping Use   Vaping Use: Never used  Substance Use Topics   Alcohol use: No    Alcohol/week: 0.0 standard drinks   Drug use: No    Family History  Problem Relation Age of Onset   Heart disease Mother    Alcohol abuse Father    Healthy Sister    Diabetes Maternal Grandmother    Hypertension Maternal Grandmother    Diabetes Maternal Grandfather    Hypertension Maternal Grandfather    Stroke Paternal Grandmother    Heart attack Paternal Grandfather    Healthy Daughter  Allergies  Allergen Reactions   Ciprofloxacin Other (See Comments)    SEVERE JOINT PAIN Other reaction(s): Unknown   Pramipexole Other (See Comments)    Could not get up for 24 hours   Oxycodone-Acetaminophen     Other reaction(s): Unknown    Medication list has been reviewed and updated.  Current Outpatient Medications on File Prior to Visit  Medication Sig Dispense Refill   amantadine (SYMMETREL) 100 MG capsule Take 1 capsule (100 mg total) by mouth 2 (two) times daily. 180 capsule 0   carbidopa-levodopa (SINEMET CR) 50-200 MG tablet Take 1 tablet by mouth at bedtime. 90 tablet 1   carbidopa-levodopa (SINEMET IR) 25-100 MG tablet Take 1 tablet by mouth 6 (six) times daily. 540 tablet 1   diazepam (VALIUM) 5 MG tablet 1-1.5 tabs  po BID PRN 90 tablet 2   DULoxetine (CYMBALTA) 30 MG capsule Take 1 capsule (30 mg total) by mouth 2 (two) times daily. 180 capsule 1   estradiol (ESTRACE) 0.1 MG/GM vaginal cream Use 1/2 g vaginally every night for the first 2 weeks, then use 1/2 g vaginally two or three times per week as needed to maintain symptom relief. 42.5 g 2   traZODone (DESYREL) 50 MG tablet Take 1-2 tablets (50-100 mg total) by mouth at bedtime. 180 tablet 1   No current facility-administered medications on file prior to visit.    Review of Systems:  As per HPI- otherwise negative.   Physical Examination: Vitals:   06/07/21 0825  BP: 110/72  Pulse: 97  Resp: 18  Temp: 98.1 F (36.7 C)  SpO2: 95%   Vitals:   06/07/21 0825  Weight: 161 lb (73 kg)  Height: '5\' 4"'$  (1.626 m)   Body mass index is 27.64 kg/m. Ideal Body Weight: Weight in (lb) to have BMI = 25: 145.3  GEN: no acute distress.  Mild overweight, looks well  HEENT: Atraumatic, Normocephalic.  Ears and Nose: No external deformity. CV: RRR, No M/G/R. No JVD. No thrill. No extra heart sounds. PULM: CTA B, no wheezes, crackles, rhonchi. No retractions. No resp. distress. No accessory muscle use. ABD: S, NT, ND, +BS. No rebound. No HSM. EXTR: No c/c/e PSYCH: Normally interactive. Conversant.    Assessment and Plan: Physical exam  Parkinson's disease ([place])  Vitamin D deficiency - Plan: VITAMIN D 25 Hydroxy (Vit-D Deficiency, Fractures)  Screening for diabetes mellitus - Plan: Comprehensive metabolic panel, Hemoglobin A1c  Fatigue, unspecified type - Plan: CBC, TSH  Screening for hyperlipidemia - Plan: Lipid panel  Anxiety  Encouraged healthy diet and exercise routine Will plan further follow- up pending labs. Encouraged flu, covid booster and shingles vaccines She will contact me when she needs diazepam for her anxiety   This visit occurred during the SARS-CoV-2 public health emergency.  Safety protocols were in place, including  screening questions prior to the visit, additional usage of staff PPE, and extensive cleaning of exam room while observing appropriate contact time as indicated for disinfecting solutions.   Signed Lamar Blinks, MD  Received her labs as below, message to patient  Results for orders placed or performed in visit on 06/07/21  CBC  Result Value Ref Range   WBC 4.7 4.0 - 10.5 K/uL   RBC 4.43 3.87 - 5.11 Mil/uL   Platelets 229.0 150.0 - 400.0 K/uL   Hemoglobin 13.6 12.0 - 15.0 g/dL   HCT 40.8 36.0 - 46.0 %   MCV 92.2 78.0 - 100.0 fl   MCHC 33.3 30.0 -  36.0 g/dL   RDW 12.7 11.5 - 15.5 %  Comprehensive metabolic panel  Result Value Ref Range   Sodium 138 135 - 145 mEq/L   Potassium 4.8 3.5 - 5.1 mEq/L   Chloride 103 96 - 112 mEq/L   CO2 29 19 - 32 mEq/L   Glucose, Bld 91 70 - 99 mg/dL   BUN 16 6 - 23 mg/dL   Creatinine, Ser 0.90 0.40 - 1.20 mg/dL   Total Bilirubin 0.6 0.2 - 1.2 mg/dL   Alkaline Phosphatase 56 39 - 117 U/L   AST 17 0 - 37 U/L   ALT 4 0 - 35 U/L   Total Protein 6.5 6.0 - 8.3 g/dL   Albumin 4.0 3.5 - 5.2 g/dL   GFR 73.96 >60.00 mL/min   Calcium 9.3 8.4 - 10.5 mg/dL  Hemoglobin A1c  Result Value Ref Range   Hgb A1c MFr Bld 5.3 4.6 - 6.5 %  Lipid panel  Result Value Ref Range   Cholesterol 163 0 - 200 mg/dL   Triglycerides 105.0 0.0 - 149.0 mg/dL   HDL 60.80 >39.00 mg/dL   VLDL 21.0 0.0 - 40.0 mg/dL   LDL Cholesterol 81 0 - 99 mg/dL   Total CHOL/HDL Ratio 3    NonHDL 102.15   TSH  Result Value Ref Range   TSH 0.90 0.35 - 5.50 uIU/mL  VITAMIN D 25 Hydroxy (Vit-D Deficiency, Fractures)  Result Value Ref Range   VITD 25.36 (L) 30.00 - 100.00 ng/mL

## 2021-06-07 ENCOUNTER — Other Ambulatory Visit: Payer: Self-pay

## 2021-06-07 ENCOUNTER — Encounter: Payer: Self-pay | Admitting: Family Medicine

## 2021-06-07 ENCOUNTER — Ambulatory Visit: Payer: Federal, State, Local not specified - PPO | Admitting: Family Medicine

## 2021-06-07 VITALS — BP 110/72 | HR 97 | Temp 98.1°F | Resp 18 | Ht 64.0 in | Wt 161.0 lb

## 2021-06-07 DIAGNOSIS — Z13220 Encounter for screening for lipoid disorders: Secondary | ICD-10-CM | POA: Diagnosis not present

## 2021-06-07 DIAGNOSIS — Z131 Encounter for screening for diabetes mellitus: Secondary | ICD-10-CM | POA: Diagnosis not present

## 2021-06-07 DIAGNOSIS — G20 Parkinson's disease: Secondary | ICD-10-CM | POA: Diagnosis not present

## 2021-06-07 DIAGNOSIS — Z0000 Encounter for general adult medical examination without abnormal findings: Secondary | ICD-10-CM | POA: Diagnosis not present

## 2021-06-07 DIAGNOSIS — R5383 Other fatigue: Secondary | ICD-10-CM | POA: Diagnosis not present

## 2021-06-07 DIAGNOSIS — E559 Vitamin D deficiency, unspecified: Secondary | ICD-10-CM | POA: Diagnosis not present

## 2021-06-07 DIAGNOSIS — F419 Anxiety disorder, unspecified: Secondary | ICD-10-CM

## 2021-06-07 LAB — LIPID PANEL
Cholesterol: 163 mg/dL (ref 0–200)
HDL: 60.8 mg/dL (ref >39.00)
LDL Cholesterol: 81 mg/dL (ref 0–99)
NonHDL: 102.15
Total CHOL/HDL Ratio: 3
Triglycerides: 105 mg/dL (ref 0.0–149.0)
VLDL: 21 mg/dL (ref 0.0–40.0)

## 2021-06-07 LAB — TSH: TSH: 0.9 u[IU]/mL (ref 0.35–5.50)

## 2021-06-07 LAB — COMPREHENSIVE METABOLIC PANEL
ALT: 4 U/L (ref 0–35)
AST: 17 U/L (ref 0–37)
Albumin: 4 g/dL (ref 3.5–5.2)
Alkaline Phosphatase: 56 U/L (ref 39–117)
BUN: 16 mg/dL (ref 6–23)
CO2: 29 mEq/L (ref 19–32)
Calcium: 9.3 mg/dL (ref 8.4–10.5)
Chloride: 103 mEq/L (ref 96–112)
Creatinine, Ser: 0.9 mg/dL (ref 0.40–1.20)
GFR: 73.96 mL/min (ref >60.00)
Glucose, Bld: 91 mg/dL (ref 70–99)
Potassium: 4.8 mEq/L (ref 3.5–5.1)
Sodium: 138 mEq/L (ref 135–145)
Total Bilirubin: 0.6 mg/dL (ref 0.2–1.2)
Total Protein: 6.5 g/dL (ref 6.0–8.3)

## 2021-06-07 LAB — VITAMIN D 25 HYDROXY (VIT D DEFICIENCY, FRACTURES): VITD: 25.36 ng/mL — ABNORMAL LOW (ref 30.00–100.00)

## 2021-06-07 LAB — CBC
HCT: 40.8 % (ref 36.0–46.0)
Hemoglobin: 13.6 g/dL (ref 12.0–15.0)
MCHC: 33.3 g/dL (ref 30.0–36.0)
MCV: 92.2 fl (ref 78.0–100.0)
Platelets: 229 10*3/uL (ref 150.0–400.0)
RBC: 4.43 Mil/uL (ref 3.87–5.11)
RDW: 12.7 % (ref 11.5–15.5)
WBC: 4.7 10*3/uL (ref 4.0–10.5)

## 2021-06-07 LAB — HEMOGLOBIN A1C: Hgb A1c MFr Bld: 5.3 % (ref 4.6–6.5)

## 2021-07-28 ENCOUNTER — Other Ambulatory Visit: Payer: Self-pay

## 2021-07-28 ENCOUNTER — Ambulatory Visit: Payer: Federal, State, Local not specified - PPO | Admitting: Psychiatry

## 2021-07-28 ENCOUNTER — Encounter: Payer: Self-pay | Admitting: Psychiatry

## 2021-07-28 DIAGNOSIS — F411 Generalized anxiety disorder: Secondary | ICD-10-CM | POA: Diagnosis not present

## 2021-07-28 DIAGNOSIS — F324 Major depressive disorder, single episode, in partial remission: Secondary | ICD-10-CM | POA: Diagnosis not present

## 2021-07-28 DIAGNOSIS — F32A Depression, unspecified: Secondary | ICD-10-CM | POA: Diagnosis not present

## 2021-07-28 DIAGNOSIS — F410 Panic disorder [episodic paroxysmal anxiety] without agoraphobia: Secondary | ICD-10-CM | POA: Diagnosis not present

## 2021-07-28 DIAGNOSIS — R5383 Other fatigue: Secondary | ICD-10-CM

## 2021-07-28 DIAGNOSIS — F419 Anxiety disorder, unspecified: Secondary | ICD-10-CM

## 2021-07-28 DIAGNOSIS — F5105 Insomnia due to other mental disorder: Secondary | ICD-10-CM

## 2021-07-28 DIAGNOSIS — F430 Acute stress reaction: Secondary | ICD-10-CM

## 2021-07-28 DIAGNOSIS — R7989 Other specified abnormal findings of blood chemistry: Secondary | ICD-10-CM

## 2021-07-28 MED ORDER — VITAMIN D (ERGOCALCIFEROL) 1.25 MG (50000 UNIT) PO CAPS: 50000.0000 [IU] | ORAL_CAPSULE | ORAL | 3 refills | Status: DC

## 2021-07-28 MED ORDER — MODAFINIL 200 MG PO TABS: ORAL_TABLET | ORAL | 1 refills | Status: DC

## 2021-07-28 MED ORDER — TRAZODONE HCL 50 MG PO TABS: 50.0000 mg | ORAL_TABLET | Freq: Every day | ORAL | 1 refills | Status: DC

## 2021-07-28 MED ORDER — DIAZEPAM 5 MG PO TABS: ORAL_TABLET | ORAL | 2 refills | Status: DC

## 2021-07-28 NOTE — Progress Notes (Signed)
[name] [id number] 06-08-1971 50 y.o.  Subjective:   [name] is a 50 y.o. (DOB 1970-11-12) female.  Chief Complaint:  Chief Complaint  Patient presents with   Follow-up   Anxiety   Depression    HPI [name] presents to the office today for follow-up of depression and anxiety.  01/2020 appt with the following noted and no med changes: Going good. Better motivated and she feelks better.  Occ anxiety spontaneous with diazepam helpful.  Overall anxiety level of anxiety is better and less depression.  Most improvement in the last 3 weeks with better activity.  Irritability resolved. Tolerating duloxetine prefers AM and noon.    05/18/2020 appt wit the following noted: Job still stressful but meds are fine.  Short staffed.  Stress eating.  Work out 3 times weekly and plans more.  Fears vaccine mandate will cause more employees to leave.   Diazepam once or twice weekly.  No panic.  11/16/2020 appointment with following noted: Still on duloxetine 30 BID and rare diazepam.  PD meds without change. Sleep problems for a couple of months.  EMA and initial insomnia. Caffeine before lunch 2 cups.  Some nights only 2 hours sleep. Depression might be a little worse, but tired of not sleeping.  Anxiety is OK.  Work is better.  Average 3 hours sleep per night. Plan: start trazodone 50 and continue duloxetine 30 BID  01/12/21 appt noted:  Trazodone partly helped.  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  Don't know where it's coming from.  Not like that in years.  H notices but not complaining.  Harder to get to sleep those days.  It's gone in the morning.  Going on for 3 weeks. No history of mania. 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  SOB, racing heart, shakey,  difficulty concentrating, fear of making mistakes.  07/28/2021 appointment with the following noted:   Exercises with PD group and feels good physically. No family history of bipolar.  Past Psychiatric History:  No psychiatrist or counselor. Past Psychiatric Medication Trials: Celexa, Lexapro, Wellbutrin SE,  Diazepam Remote Ambien  Review of Systems:  Review of Systems  Constitutional:  Positive for fatigue.  Cardiovascular:  Negative for chest pain and palpitations.  Neurological:  Positive for tremors. Negative for dizziness and weakness.  Psychiatric/Behavioral:  The patient is nervous/anxious.    Medications: I have reviewed the patient's current medications.  Current Outpatient Medications  Medication Sig Dispense Refill   amantadine (SYMMETREL) 100 MG capsule Take 1 capsule (100 mg total) by mouth 2 (two) times daily. 180 capsule 0   carbidopa-levodopa (SINEMET CR) 50-200 MG tablet Take 1 tablet by mouth at bedtime. 90 tablet 1   carbidopa-levodopa (SINEMET IR) 25-100 MG tablet Take 1 tablet by mouth 6 (six) times daily. 540 tablet 1   DULoxetine (CYMBALTA) 30 MG capsule Take 1 capsule (30 mg total) by mouth 2 (two) times daily. 180 capsule 1   estradiol (ESTRACE) 0.1 MG/GM vaginal cream Use 1/2 g vaginally every night for the first 2 weeks, then use 1/2 g vaginally two or three times per week as needed to maintain symptom relief. 42.5 g 2   modafinil (PROVIGIL) 200 MG tablet 1/2 tablet in AM for a  week, then 1 each AM 30 tablet 1   diazepam (VALIUM) 5 MG tablet 1-1.5 tabs po BID PRN 90 tablet 2   traZODone (DESYREL) 50 MG tablet Take 1-2 tablets (50-100 mg total) by mouth at bedtime. 180 tablet 1   Vitamin D, Ergocalciferol, (DRISDOL) 1.25 MG (50000 UNIT) CAPS capsule Take 1 capsule (50,000 Units total) by mouth every 7 (seven) days. 15 capsule 3   No current facility-administered medications for this visit.    Medication Side Effects: None  Allergies:  Allergies   Allergen Reactions   Ciprofloxacin Other (See Comments)    SEVERE JOINT PAIN Other reaction(s): Unknown   Pramipexole Other (See Comments)    Could not get up for 24 hours   Oxycodone-Acetaminophen     Other reaction(s): Unknown    Past Medical History:  Diagnosis Date   Abnormal Pap smear of cervix 1990   unsure abnormality, colpo OK--hx of cryotherapy to cervix   Parkinson disease ([place]) 12/15    Family History  Problem Relation Age of Onset   Heart disease Mother    Alcohol abuse Father    Healthy Sister    Diabetes Maternal Grandmother    Hypertension Maternal Grandmother    Diabetes Maternal Grandfather    Hypertension Maternal Grandfather    Stroke Paternal Grandmother    Heart attack Paternal Grandfather    Healthy Daughter     Social History   Socioeconomic History   Marital status: Married    Spouse name: Not on file   Number of children: 1   Years of education: Not on file   Highest education level: Associate degree: academic program  Occupational History   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[employer]    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  Tobacco Use   Smoking status: Former    Packs/day: 0.50    Types: Cigarettes    Quit date: 07/04/2016    Years since quitting: 5.0   Smokeless tobacco: Never  Vaping Use   Vaping Use: Never used  Substance and Sexual Activity   Alcohol use: No    Alcohol/week: 0.0 standard drinks   Drug use: No   Sexual activity: Yes    Partners: Male    Birth control/protection: Post-menopausal  Other Topics Concern   Not on file  Social History Narrative   Not on file   Social Determinants of Health   Financial Resource Strain: Not on file  Food Insecurity: Not on file  Transportation Needs: Not on file  Physical Activity: Not on file  Stress: Not on file  Social Connections: Not on file  Intimate Partner Violence: Not on file    Past Medical History, Surgical history, Social history, and Family history  were reviewed and updated as appropriate.   Please see review of systems for further details on the patient's review from today.   Objective:   Physical Exam:  LMP 01/14/2016 (Exact Date)   Physical Exam Constitutional:      General: She is not in acute distress. Musculoskeletal:        General: No deformity.  Neurological:     Mental Status: She is alert and oriented to person, place, and time.     Motor: No weakness.     Coordination: Coordination normal.  Psychiatric:        Attention and Perception: Attention and perception normal. She does not perceive auditory or visual hallucinations.        Mood and Affect: Mood  is anxious and depressed. Affect is not labile, blunt, angry, tearful or inappropriate.        Speech: Speech normal.        Behavior: Behavior normal. Behavior is not slowed.        Thought Content: Thought content normal. Thought content is not paranoid or delusional. Thought content does not include homicidal or suicidal ideation.        Cognition and Memory: Cognition and memory normal.        Judgment: Judgment normal.     Comments: Insight intact Less Stressed    Ameyah Bangura [id number] December 13, 1970 50 y.o.  Subjective:   Patient ID:  Charity Tessier is a 50 y.o. (DOB 1971-01-16) female.  Chief Complaint:  Chief Complaint  Patient presents with   Follow-up   Anxiety   Depression    HPI Abel Ra presents to the office today for follow-up of depression and anxiety.  01/2020 appt with the following noted and no med changes: Going good. Better motivated and she feelks better.  Occ anxiety spontaneous with diazepam helpful.  Overall anxiety level of anxiety is better and less depression.  Most improvement in the last 3 weeks with better activity.  Irritability resolved. Tolerating duloxetine prefers AM and noon.    05/18/2020 appt wit the following noted: Job still stressful but meds are fine.  Short staffed.  Stress eating.   Work out 3 times weekly and plans more.  Fears vaccine mandate will cause more employees to leave.   Diazepam once or twice weekly.  No panic.  11/16/2020 appointment with following noted: Still on duloxetine 30 BID and rare diazepam.  PD meds without change. Sleep problems for a couple of months.  EMA and initial insomnia. Caffeine before lunch 2 cups.  Some nights only 2 hours sleep. Depression might be a little worse, but tired of not sleeping.  Anxiety is OK.  Work is better.  Average 3 hours sleep per night. Plan: start trazodone 50 and continue duloxetine 30 BID  01/12/21 appt noted:  Trazodone partly helped.  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  Don't know where it's coming from.  Not like that in years.  H notices but not complaining.  Harder to get to sleep those days.  It's gone in the morning.  Going on for 3 weeks. No history of mania.  Plan: Continue current duloxetine 30 BID, try moving both doses back tomorning. trazodone can increase to 100 mg if needed for sleep  02/05/2021 urgent appointment scheduled at patient request: Day after seen.  M died in her sleep.  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  Old dog is dying.  Straw broke camel's back was job stressor.  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  Sleep is OK.  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  Has told people she can't do it.    04/29/21 Average diazepam about 4 days per week. Continues duloxetine 30 BID, Trazodone 50-100 HS. No SE. Still dealing with estate things and work stress.  Got off 6 weeks from work but not STD.   Function at work seems OK.  Still gets anxiety attacks with SOB at work about once daily but fights through it.  Still short staffed at work. Sleep 10-530 usually. Not able to find much enjoyment  usually.   Feels she is overextended and that makes it hard to relax into  after hour activites.  Hope when estate is resolved things will get better. Plan: Continue current duloxetine 30 BID, try moving both doses back tomorning. trazodone can increase to 100 mg if needed for sleep  07/28/21 appt noted: Sleep all the time if can now.  So tired for a couple of weeks. Taking diazepam more rarely.  Some weeks none. No med changes. NO change PD meds. Don't know if depressed.  No reason.  Too tired to enjoy things.  Not a lot of nervousness. No panic  Exercises with PD group and feels good physically. No family history of bipolar.  Past Psychiatric History:  No psychiatrist or counselor. Past Psychiatric Medication Trials: Celexa, Lexapro, Wellbutrin SE,  Diazepam Remote Ambien  Review of Systems:  Review of Systems  Cardiovascular: Negative for chest pain and palpitations.  Neurological: Positive for tremors. Negative for dizziness and weakness.    Medications: I have reviewed the patient's current medications.  Current Outpatient Medications  Medication Sig Dispense Refill   amantadine (SYMMETREL) 100 MG capsule Take 1 capsule (100 mg total) by mouth 2 (two) times daily. 180 capsule 0   carbidopa-levodopa (SINEMET CR) 50-200 MG tablet Take 1 tablet by mouth at bedtime. 90 tablet 1   carbidopa-levodopa (SINEMET IR) 25-100 MG tablet Take 1 tablet by mouth 6 (six) times daily. 540 tablet 1   DULoxetine (CYMBALTA) 30 MG capsule Take 1 capsule (30 mg total) by mouth 2 (two) times daily. 180 capsule 1   estradiol (ESTRACE) 0.1 MG/GM vaginal cream Use 1/2 g vaginally every night for the first 2 weeks, then use 1/2 g vaginally two or three times per week as needed to maintain symptom relief. 42.5 g 2   modafinil (PROVIGIL) 200 MG tablet 1/2 tablet in AM for a week, then 1 each AM 30 tablet 1   diazepam (VALIUM) 5 MG tablet 1-1.5 tabs po BID PRN 90 tablet 2   traZODone (DESYREL) 50 MG tablet Take 1-2 tablets (50-100 mg total) by mouth at bedtime. 180 tablet 1    Vitamin D, Ergocalciferol, (DRISDOL) 1.25 MG (50000 UNIT) CAPS capsule Take 1 capsule (50,000 Units total) by mouth every 7 (seven) days. 15 capsule 3   No current facility-administered medications for this visit.    Medication Side Effects: None  Allergies:  Allergies  Allergen Reactions   Ciprofloxacin Other (See Comments)    SEVERE JOINT PAIN Other reaction(s): Unknown   Pramipexole Other (See Comments)    Could not get up for 24 hours   Oxycodone-Acetaminophen     Other reaction(s): Unknown    Past Medical History:  Diagnosis Date   Abnormal Pap smear of cervix 1990   unsure abnormality, colpo OK--hx of cryotherapy to cervix   Parkinson disease ([place]) 12/15    Family History  Problem Relation Age of Onset   Heart disease Mother    Alcohol abuse Father    Healthy Sister    Diabetes Maternal Grandmother    Hypertension Maternal Grandmother    Diabetes Maternal Grandfather    Hypertension Maternal Grandfather    Stroke Paternal Grandmother    Heart attack Paternal Grandfather    Healthy Daughter     Social History   Socioeconomic History   Marital status: Married    Spouse name: Not on file   Number of children: 1   Years of education: Not on file   Highest education level:  Associate degree: academic program  Occupational History   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[employer]    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  Tobacco Use   Smoking status: Former    Packs/day: 0.50    Types: Cigarettes    Quit date: 07/04/2016    Years since quitting: 5.0   Smokeless tobacco: Never  Vaping Use   Vaping Use: Never used  Substance and Sexual Activity   Alcohol use: No    Alcohol/week: 0.0 standard drinks   Drug use: No   Sexual activity: Yes    Partners: Male    Birth control/protection: Post-menopausal  Other Topics Concern   Not on file  Social History Narrative   Not on file   Social Determinants of Health   Financial Resource Strain: Not on file   Food Insecurity: Not on file  Transportation Needs: Not on file  Physical Activity: Not on file  Stress: Not on file  Social Connections: Not on file  Intimate Partner Violence: Not on file    Past Medical History, Surgical history, Social history, and Family history were reviewed and updated as appropriate.   Please see review of systems for further details on the patient's review from today.   Objective:   Physical Exam:  LMP 01/14/2016 (Exact Date)   Physical Exam Constitutional:      General: She is not in acute distress. Musculoskeletal:        General: No deformity.  Neurological:     Mental Status: She is alert and oriented to person, place, and time.     Motor: No weakness.     Coordination: Coordination normal.  Psychiatric:        Attention and Perception: Attention and perception normal. She does not perceive auditory or visual hallucinations.        Mood and Affect: Mood is depressed. Mood is not anxious. Affect is not labile, blunt, angry, tearful or inappropriate.        Speech: Speech normal.        Behavior: Behavior normal.        Thought Content: Thought content normal. Thought content is not paranoid or delusional. Thought content does not include homicidal or suicidal ideation. Thought content does not include homicidal or suicidal plan.        Cognition and Memory: Cognition and memory normal.        Judgment: Judgment normal.     Comments: Insight intact      Lab Review:     Component Value Date/Time   NA 138 06/07/2021 0855   NA 141 08/14/2019 0828   K 4.8 06/07/2021 0855   CL 103 06/07/2021 0855   CO2 29 06/07/2021 0855   GLUCOSE 91 06/07/2021 0855   BUN 16 06/07/2021 0855   BUN 13 08/14/2019 0828   CREATININE 0.90 06/07/2021 0855   CREATININE 0.77 12/28/2020 1406   CALCIUM 9.3 06/07/2021 0855   PROT 6.5 06/07/2021 0855   PROT 7.0 08/14/2019 0828   ALBUMIN 4.0 06/07/2021 0855   ALBUMIN 4.2 08/14/2019 0828   AST 17 06/07/2021 0855    ALT 4 06/07/2021 0855   ALKPHOS 56 06/07/2021 0855   BILITOT 0.6 06/07/2021 0855   BILITOT 0.6 08/14/2019 0828   GFRNONAA >60 01/19/2020 1233   GFRAA >60 01/19/2020 1233       Component Value Date/Time   WBC 4.7 06/07/2021 0855   RBC 4.43 06/07/2021 0855   HGB 13.6 06/07/2021 0855  HGB 14.6 08/14/2019 0828   HGB 13.2 04/02/2015 0905   HCT 40.8 06/07/2021 0855   HCT 43.7 08/14/2019 0828   PLT 229.0 06/07/2021 0855   PLT 257 08/14/2019 0828   MCV 92.2 06/07/2021 0855   MCV 94 08/14/2019 0828   MCH 30.8 12/28/2020 1406   MCHC 33.3 06/07/2021 0855   RDW 12.7 06/07/2021 0855   RDW 12.1 08/14/2019 0828   LYMPHSABS 2.1 01/19/2020 1233   MONOABS 0.5 01/19/2020 1233   EOSABS 0.2 01/19/2020 1233   BASOSABS 0.1 01/19/2020 1233    No results found for: POCLITH, LITHIUM   No results found for: PHENYTOIN, PHENOBARB, VALPROATE, CBMZ   .res Assessment: Plan:    Harmony was seen today for follow-up, anxiety and depression.  Diagnoses and all orders for this visit:  Major depressive disorder with single episode, in partial remission ([place]) -     modafinil (PROVIGIL) 200 MG tablet; 1/2 tablet in AM for a week, then 1 each AM  Generalized anxiety disorder  Panic disorder without agoraphobia  Fatigue due to depression -     modafinil (PROVIGIL) 200 MG tablet; 1/2 tablet in AM for a week, then 1 each AM  Low vitamin D level -     Vitamin D, Ergocalciferol, (DRISDOL) 1.25 MG (50000 UNIT) CAPS capsule; Take 1 capsule (50,000 Units total) by mouth every 7 (seven) days.  Anxiety -     diazepam (VALIUM) 5 MG tablet; 1-1.5 tabs po BID PRN  Panic attack as reaction to stress -     diazepam (VALIUM) 5 MG tablet; 1-1.5 tabs po BID PRN  Insomnia due to mental condition -     traZODone (DESYREL) 50 MG tablet; Take 1-2 tablets (50-100 mg total) by mouth at bedtime. Greater than 50% of 30 min face to face time with patient was spent on counseling and coordination of care. We discussed  Ximena has the diagnoses noted above and was not well controlled on Wellbutrin and Lexapro.  Her symptoms of depression and anxiety are better with the switch to duloxetine 30 mg twice daily.   Also dx PD previously tends to have some dizziness if she takes 60 mg at once.  Otherwise she is tolerating the current dosage.  She has had near resolution of depression and resolution of the irritability.  She still has some episodic minor anxiety which is overall improved.  She is functioning better and more active.  She is satisfied with the med response at this time.  Doubt bipolar but described sx and call if gets manic  She still takes diazepam on occasion but is not taking it as excessively.  And has not increased it and this is not the source of her fatigue.  Discussed options for fatigue.  On the one hand low vitamin D can be associated with fatigue and she reports a history of low vitamin D but not taking any supplement.  Recommend she restart vitamin D.  She asked for the once weekly supplement. Modafinil can be used off label for fatigue and people with chronic illnesses such as Parkinson's disease.  This may be helpful.  Discussed side effects in detail Modafinil 100 mg every morning for 7 days then 200 mg every morning  Needs better balance with work and stress and it should get better after estate.  Disc recent FMLA and no more is currently available to her.  Disc ways to balance this as much as possible.  Continue current duloxetine 30 BID, try moving  both doses back tomorning. trazodone can increase to 100 mg if needed for sleep  Disc SE in detail and SSRI withdrawal sx.  If it fails call back  We discussed the short-term risks associated with benzodiazepines including sedation and increased fall risk among others.  Discussed long-term side effect risk including dependence, potential withdrawal symptoms, and the potential eventual dose-related risk of dementia.  But recent studies from 2020  dispute this association between benzodiazepines and dementia risk. Newer studies in 2020 do not support an association with dementia.  Follow-up 3-4 mos  Call if you have any additional symptoms or problems with the medication.  Lynder Parents MD, DFAPA  Please see After Visit Summary for patient specific instructions.  Future Appointments  Date Time Provider [place]  09/28/2021  8:15 AM Tat, Eustace Quail, DO [id number] None  12/06/2021  8:20 AM Copland, Gay Filler, MD [id number]    No orders of the defined types were placed in this encounter.   -------------------------------

## 2021-08-18 ENCOUNTER — Other Ambulatory Visit: Payer: Self-pay

## 2021-08-18 ENCOUNTER — Encounter: Payer: Self-pay | Admitting: Neurology

## 2021-08-18 MED ORDER — CARBIDOPA-LEVODOPA 25-100 MG PO TABS: 2.0000 | ORAL_TABLET | ORAL | 1 refills | Status: DC

## 2021-08-23 ENCOUNTER — Other Ambulatory Visit: Payer: Self-pay | Admitting: Neurology

## 2021-08-23 DIAGNOSIS — G20 Parkinson's disease: Secondary | ICD-10-CM

## 2021-08-24 ENCOUNTER — Other Ambulatory Visit: Payer: Self-pay

## 2021-09-15 ENCOUNTER — Encounter: Payer: Self-pay | Admitting: Family

## 2021-09-15 ENCOUNTER — Telehealth (INDEPENDENT_AMBULATORY_CARE_PROVIDER_SITE_OTHER): Payer: Federal, State, Local not specified - PPO | Admitting: Family

## 2021-09-15 VITALS — Temp 99.9°F | Ht 64.0 in | Wt 161.0 lb

## 2021-09-15 DIAGNOSIS — U071 COVID-19: Secondary | ICD-10-CM | POA: Diagnosis not present

## 2021-09-15 DIAGNOSIS — R051 Acute cough: Secondary | ICD-10-CM | POA: Diagnosis not present

## 2021-09-15 MED ORDER — NIRMATRELVIR/RITONAVIR (PAXLOVID)TABLET: 3.0000 | ORAL_TABLET | Freq: Two times a day (BID) | ORAL | 0 refills | Status: AC

## 2021-09-15 MED ORDER — NIRMATRELVIR/RITONAVIR (PAXLOVID)TABLET: 3.0000 | ORAL_TABLET | Freq: Two times a day (BID) | ORAL | 0 refills | Status: DC

## 2021-09-15 MED ORDER — PROMETHAZINE-DM 6.25-15 MG/5ML PO SYRP: 5.0000 mL | ORAL_SOLUTION | Freq: Four times a day (QID) | ORAL | 0 refills | Status: DC | PRN

## 2021-09-15 NOTE — Progress Notes (Signed)
Virtual Visit via Video   I connected with patient on 09/15/21 at  3:20 PM EST by a video enabled telemedicine application and verified that I am speaking with the correct person using two identifiers.  Location patient: Home Location provider: [facility], Office Persons participating in the virtual visit: Patient, Provider, CMA   I discussed the limitations of evaluation and management by telemedicine and the availability of in person appointments. The patient expressed understanding and agreed to proceed.  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 She has tested positive for COVID-19 yesterday. She is unvaccinated. Stable appearance. She is interested in antiviral therapy.   ROS:   See pertinent positives and negatives per HPI.  Patient Active Problem List   Diagnosis Date Noted   Acute right-sided low back pain without sciatica 09/22/2016   Allergy to pollen 04/07/2016   Headache 04/07/2016   Bronchitis 10/02/2015   Acute non-recurrent frontal sinusitis 10/02/2015   Restless leg 12/23/2014   Anxiety 12/10/2014   Parkinson's disease ([place]) 08/19/2014   Cervical radiculopathy 07/29/2014   Leg cramps 07/29/2014   Hemiparesis ([place]) 07/01/2014   Abnormal involuntary movement 07/01/2014   Tremor of left hand 07/01/2014   Dysfunctional uterine bleeding 06/08/2014    Social History   Tobacco Use   Smoking status: Former    Packs/day: 0.50    Types: Cigarettes    Quit date: 07/04/2016    Years since quitting: 5.2   Smokeless tobacco: Never  Substance Use Topics   Alcohol use: No    Alcohol/week: 0.0 standard drinks    Current Outpatient Medications:    amantadine (SYMMETREL) 100 MG capsule, Take 1 capsule (100 mg total) by mouth 2 (two) times daily., Disp: 180 capsule, Rfl: 0   carbidopa-levodopa (SINEMET CR) 50-200 MG tablet, TAKE ONE TABLET BY MOUTH AT BEDTIME, Disp: 90 tablet, Rfl:  1   carbidopa-levodopa (SINEMET IR) 25-100 MG tablet, Take 2 tablets by mouth as directed. Can take up to 8 tablets a day., Disp: 720 tablet, Rfl: 1   diazepam (VALIUM) 5 MG tablet, 1-1.5 tabs po BID PRN, Disp: 90 tablet, Rfl: 2   DULoxetine (CYMBALTA) 30 MG capsule, Take 1 capsule (30 mg total) by mouth 2 (two) times daily., Disp: 180 capsule, Rfl: 1   estradiol (ESTRACE) 0.1 MG/GM vaginal cream, Use 1/2 g vaginally every night for the first 2 weeks, then use 1/2 g vaginally two or three times per week as needed to maintain symptom relief., Disp: 42.5 g, Rfl: 2   modafinil (PROVIGIL) 200 MG tablet, 1/2 tablet in AM for a week, then 1 each AM, Disp: 30 tablet, Rfl: 1   nirmatrelvir/ritonavir EUA (PAXLOVID) 20 x 150 MG & 10 x 100MG  TABS, Take 3 tablets by mouth 2 (two) times daily for 5 days. (Take nirmatrelvir 150 mg two tablets twice daily for 5 days and ritonavir 100 mg one tablet twice daily for 5 days) Patient GFR is 73, Disp: 30 tablet, Rfl: 0   promethazine-dextromethorphan (PROMETHAZINE-DM) 6.25-15 MG/5ML syrup, Take 5 mLs by mouth 4 (four) times daily as needed., Disp: 118 mL, Rfl: 0   traZODone (DESYREL) 50 MG tablet, Take 1-2 tablets (50-100 mg total) by mouth at bedtime., Disp: 180 tablet, Rfl: 1   Vitamin D, Ergocalciferol, (DRISDOL) 1.25 MG (50000 UNIT) CAPS capsule, Take 1 capsule (50,000 Units total) by mouth every 7 (seven) days., Disp: 15 capsule, Rfl: 3  Allergies  Allergen Reactions   Ciprofloxacin Other (See Comments)    SEVERE JOINT PAIN Other reaction(s): Unknown   Pramipexole Other (See Comments)    Could not get up for 24 hours   Oxycodone-Acetaminophen     Other reaction(s): Unknown    Objective:   Temp 99.9 F (37.7 C) (Oral)    Ht 5\' 4"  (1.626 m)    Wt 161 lb (73 kg)    LMP 01/14/2016 (Exact Date)    BMI 27.64 kg/m   Patient is well-developed, well-nourished in no acute distress.  Resting comfortably at home.  Head is normocephalic, atraumatic.  No labored  breathing.  Speech is clear and coherent with logical content.  Patient is alert and oriented at baseline.    Assessment and Plan:  Meghann was seen today for acute visit.  Diagnoses and all orders for this visit:  COVID-19  Acute cough  Other orders -     nirmatrelvir/ritonavir EUA (PAXLOVID) 20 x 150 MG & 10 x 100MG  TABS; Take 3 tablets by mouth 2 (two) times daily for 5 days. (Take nirmatrelvir 150 mg two tablets twice daily for 5 days and ritonavir 100 mg one tablet twice daily for 5 days) Patient GFR is 73 -     promethazine-dextromethorphan (PROMETHAZINE-DM) 6.25-15 MG/5ML syrup; Take 5 mLs by mouth 4 (four) times daily as needed.     COVID-19 precautions given for home isolation and medication management. Patient is currently stable and interested in taking Paxlovid. Rest, drink plenty of fluids. Call the office if symptoms worsen or persist. Recheck as scheduled and sooner as needed.  Kennyth Arnold, FNP 09/15/2021

## 2021-09-21 ENCOUNTER — Encounter: Payer: Self-pay | Admitting: Neurology

## 2021-09-24 NOTE — Progress Notes (Signed)
Assessment/Plan:   1.  Parkinsons Disease  -continue carbidopa/levodopa 25/100, 2 po tid-qid             -Continue carbidopa/levodopa 50/200 at bedtime.             -increase amantadine to 100 mg tid  -We discussed that it used to be thought that levodopa would increase risk of melanoma but now it is believed that Parkinsons itself likely increases risk of melanoma. she is to get regular skin checks.   2.  Restless leg             -Did better with CR levodopa at bedtime.   3.  GAD/MDD             -Under the care of Dr. Clovis Pu.  She is currently being treated with duloxetine, trazodone and Provigil.   Subjective:   Sarah Welch was seen today in follow up for Parkinsons disease.  My previous records were reviewed prior to todays visit as well as outside records available to me.  I have actually not seen her in person since 2021, although we have kept in touch regularly via video visits.  She had COVID last month and following that, her hands felt weak and she felt cognitively dull.  She did go back to work.  Pt denies falls.  Pt denies lightheadedness, near syncope.  No hallucinations.  On amantadine and notes that the L foot wiggles and "I can't stand still."  The right side though will cramp.  She continues to follow with Dr. Clovis Pu.  She last saw him on November 9.  He started her on Provigil.  Current prescribed movement disorder medications:  Carbidopa/levodopa 25/100, takes 2 po q 4 hours (generally 2 po tid-qid) Carbidopa/levodopa 50/200 q hs  Amantadine 100 mg twice daily   PREVIOUS MEDICATIONS:  citalopram, Lexapro; Wellbutrin; azilect (no help); pramipexole (swelling, nausea); neupro (states too expensive)  ALLERGIES:   Allergies  Allergen Reactions   Ciprofloxacin Other (See Comments)    SEVERE JOINT PAIN Other reaction(s): Unknown   Pramipexole Other (See Comments)    Could not get up for 24 hours   Oxycodone-Acetaminophen     Other reaction(s): Unknown     CURRENT MEDICATIONS:  Current Meds  Medication Sig   carbidopa-levodopa (SINEMET CR) 50-200 MG tablet TAKE ONE TABLET BY MOUTH AT BEDTIME   carbidopa-levodopa (SINEMET IR) 25-100 MG tablet Take 2 tablets by mouth as directed. Can take up to 8 tablets a day.   diazepam (VALIUM) 5 MG tablet 1-1.5 tabs po BID PRN   DULoxetine (CYMBALTA) 30 MG capsule Take 1 capsule (30 mg total) by mouth 2 (two) times daily.   estradiol (ESTRACE) 0.1 MG/GM vaginal cream Use 1/2 g vaginally every night for the first 2 weeks, then use 1/2 g vaginally two or three times per week as needed to maintain symptom relief.   modafinil (PROVIGIL) 200 MG tablet 1/2 tablet in AM for a week, then 1 each AM   promethazine-dextromethorphan (PROMETHAZINE-DM) 6.25-15 MG/5ML syrup Take 5 mLs by mouth 4 (four) times daily as needed.   traZODone (DESYREL) 50 MG tablet Take 1-2 tablets (50-100 mg total) by mouth at bedtime.   Vitamin D, Ergocalciferol, (DRISDOL) 1.25 MG (50000 UNIT) CAPS capsule Take 1 capsule (50,000 Units total) by mouth every 7 (seven) days.   [DISCONTINUED] amantadine (SYMMETREL) 100 MG capsule Take 1 capsule (100 mg total) by mouth 2 (two) times daily.     Objective:  PHYSICAL EXAMINATION:    VITALS:   Vitals:   09/28/21 0804  BP: 123/74  Pulse: 96  SpO2: 98%  Weight: 153 lb (69.4 kg)  Height: 5\' 4"  (1.626 m)    GEN:  The patient appears stated age and is in NAD. HEENT:  Normocephalic, atraumatic.  The mucous membranes are moist. The superficial temporal arteries are without ropiness or tenderness.   Neurological examination:  Orientation: The patient is alert and oriented x3. Cranial nerves: There is good facial symmetry without facial hypomimia. The speech is fluent and clear. Soft palate rises symmetrically and there is no tongue deviation. Hearing is intact to conversational tone. Sensation: Sensation is intact to light touch throughout Motor: Strength is at least antigravity  x4.  Movement examination: Tone: There is nl tone in the UE/LE Abnormal movements: there is LLE dyskinesia and mild axial dyskinesia Coordination:  There is no decremation with RAM's, with any form of RAMS, including alternating supination and pronation of the forearm, hand opening and closing, finger taps, heel taps and toe taps. Gait and Station: The patient has no difficulty arising out of a deep-seated chair without the use of the hands. The patient's stride length is good with good arm swing.    I have reviewed and interpreted the following labs independently    Chemistry      Component Value Date/Time   NA 138 06/07/2021 0855   NA 141 08/14/2019 0828   K 4.8 06/07/2021 0855   CL 103 06/07/2021 0855   CO2 29 06/07/2021 0855   BUN 16 06/07/2021 0855   BUN 13 08/14/2019 0828   CREATININE 0.90 06/07/2021 0855   CREATININE 0.77 12/28/2020 1406      Component Value Date/Time   CALCIUM 9.3 06/07/2021 0855   ALKPHOS 56 06/07/2021 0855   AST 17 06/07/2021 0855   ALT 4 06/07/2021 0855   BILITOT 0.6 06/07/2021 0855   BILITOT 0.6 08/14/2019 0828       Lab Results  Component Value Date   WBC 4.7 06/07/2021   HGB 13.6 06/07/2021   HCT 40.8 06/07/2021   MCV 92.2 06/07/2021   PLT 229.0 06/07/2021    Lab Results  Component Value Date   TSH 0.90 06/07/2021      Cc:  Copland, Gay Filler, MD

## 2021-09-28 ENCOUNTER — Encounter: Payer: Self-pay | Admitting: Neurology

## 2021-09-28 ENCOUNTER — Ambulatory Visit: Payer: Federal, State, Local not specified - PPO | Admitting: Neurology

## 2021-09-28 ENCOUNTER — Other Ambulatory Visit: Payer: Self-pay

## 2021-09-28 VITALS — BP 123/74 | HR 96 | Ht 64.0 in | Wt 153.0 lb

## 2021-09-28 DIAGNOSIS — G20 Parkinson's disease: Secondary | ICD-10-CM

## 2021-09-28 DIAGNOSIS — G249 Dystonia, unspecified: Secondary | ICD-10-CM

## 2021-09-28 MED ORDER — AMANTADINE HCL 100 MG PO CAPS: 100.0000 mg | ORAL_CAPSULE | Freq: Three times a day (TID) | ORAL | 1 refills | Status: DC

## 2021-09-28 NOTE — Patient Instructions (Signed)
Get back to the exercise!! Increase amantadine to three times per day!  The physicians and staff at [facility] are committed to providing excellent care. You may receive a survey requesting feedback about your experience at our office. We strive to receive "very good" responses to the survey questions. If you feel that your experience would prevent you from giving the office a "very good " response, please contact our office to try to remedy the situation. We may be reached at [phone]. Thank you for taking the time out of your busy day to complete the survey.

## 2021-10-26 ENCOUNTER — Other Ambulatory Visit: Payer: Self-pay | Admitting: Psychiatry

## 2021-10-26 DIAGNOSIS — F411 Generalized anxiety disorder: Secondary | ICD-10-CM

## 2021-10-26 DIAGNOSIS — F410 Panic disorder [episodic paroxysmal anxiety] without agoraphobia: Secondary | ICD-10-CM

## 2021-10-26 DIAGNOSIS — F331 Major depressive disorder, recurrent, moderate: Secondary | ICD-10-CM

## 2021-11-02 ENCOUNTER — Encounter: Payer: Self-pay | Admitting: Psychiatry

## 2021-11-02 ENCOUNTER — Other Ambulatory Visit: Payer: Self-pay

## 2021-11-02 ENCOUNTER — Ambulatory Visit: Payer: Federal, State, Local not specified - PPO | Admitting: Psychiatry

## 2021-11-02 DIAGNOSIS — F324 Major depressive disorder, single episode, in partial remission: Secondary | ICD-10-CM

## 2021-11-02 DIAGNOSIS — F410 Panic disorder [episodic paroxysmal anxiety] without agoraphobia: Secondary | ICD-10-CM | POA: Diagnosis not present

## 2021-11-02 DIAGNOSIS — F5105 Insomnia due to other mental disorder: Secondary | ICD-10-CM

## 2021-11-02 DIAGNOSIS — F411 Generalized anxiety disorder: Secondary | ICD-10-CM

## 2021-11-02 DIAGNOSIS — R5383 Other fatigue: Secondary | ICD-10-CM

## 2021-11-02 DIAGNOSIS — F331 Major depressive disorder, recurrent, moderate: Secondary | ICD-10-CM

## 2021-11-02 DIAGNOSIS — F32A Depression, unspecified: Secondary | ICD-10-CM

## 2021-11-02 MED ORDER — TRAZODONE HCL 50 MG PO TABS: 50.0000 mg | ORAL_TABLET | Freq: Every day | ORAL | 1 refills | Status: DC

## 2021-11-02 MED ORDER — DULOXETINE HCL 30 MG PO CPEP: 30.0000 mg | ORAL_CAPSULE | Freq: Two times a day (BID) | ORAL | 0 refills | Status: DC

## 2021-11-02 NOTE — Progress Notes (Signed)
Sarah Welch [id number] 04/08/1971 51 y.o.  Subjective:   [name] is a 51 y.o. (DOB 17-Jun-1971) female.  Chief Complaint:  Chief Complaint  Patient presents with   Follow-up   Fatigue    HPI Sarah Welch presents to the office today for follow-up of depression and anxiety.  01/2020 appt with the following noted and no med changes: Going good. Better motivated and she feelks better.  Occ anxiety spontaneous with diazepam helpful.  Overall anxiety level of anxiety is better and less depression.  Most improvement in the last 3 weeks with better activity.  Irritability resolved. Tolerating duloxetine prefers AM and noon.    05/18/2020 appt wit the following noted: Job still stressful but meds are fine.  Short staffed.  Stress eating.  Work out 3 times weekly and plans more.  Fears vaccine mandate will cause more employees to leave.   Diazepam once or twice weekly.  No panic.  11/16/2020 appointment with following noted: Still on duloxetine 30 BID and rare diazepam.  PD meds without change. Sleep problems for a couple of months.  EMA and initial insomnia. Caffeine before lunch 2 cups.  Some nights only 2 hours sleep. Depression might be a little worse, but tired of not sleeping.  Anxiety is OK.  Work is better.  Average 3 hours sleep per night. Plan: start trazodone 50 and continue duloxetine 30 BID  01/12/21 appt noted:  Trazodone partly helped.  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  Don't know where it's coming from.  Not like that in years.  H notices but not complaining.  Harder to get to sleep those days.  It's gone in the morning.  Going on for 3 weeks. No history of mania.  Plan: Continue current duloxetine 30 BID, try moving both doses back tomorning. trazodone can increase to 100 mg if needed for sleep  02/05/2021 urgent appointment scheduled at patient request: Day  after seen.  M died in her sleep.  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  Old dog is dying.  Straw broke camel's back was job stressor.  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  Sleep is OK.  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  Has told people she can't do it.    04/29/21 Average diazepam about 4 days per week. Continues duloxetine 30 BID, Trazodone 50-100 HS. No SE. Still dealing with estate things and work stress.  Got off 6 weeks from work but not STD.   Function at work seems OK.  Still gets anxiety attacks with SOB at work about once daily but fights through it.  Still short staffed at work. Sleep 10-530 usually. Not able to find much enjoyment usually.   Feels she is overextended and that makes it hard to relax into after hour activites.  Hope when estate is resolved things will get better. Plan: Continue current duloxetine 30 BID, try moving both doses back tomorning. trazodone can increase to 100 mg if needed for sleep  07/28/21 appt noted: Sleep all the time if can now.  So tired for a couple of weeks. Taking diazepam more rarely.  Some weeks none. No med changes. NO change PD meds. Don't know if depressed.  No reason.  Too tired to enjoy things.  Not a lot of nervousness. 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  Energy better but not normal.   Patient reports stable mood and denies depressed or irritable moods.  Patient denies any recent difficulty with anxiety.  Patient denies difficulty with sleep initiation or maintenance.  Patient reports that energy and motivation have been good.  Patient denies any difficulty with concentration.  Patient denies any suicidal ideation. Modafinil 200 noticeable but hasn't needed it in awhile. Can get overwhelmed with workload but otherwise anxiety ok Lost weight to  143# without trying.  Highest ever 160#  Exercises with PD group and feels good physically. No family history of bipolar.  Past Psychiatric History:  No psychiatrist or counselor. Past Psychiatric Medication Trials: Celexa, Lexapro, Wellbutrin SE,  Diazepam Remote Ambien  Review of Systems:  Review of Systems  Cardiovascular: Negative for chest pain and palpitations.  Neurological: Positive for tremors. Negative for dizziness and weakness.    Medications: I have reviewed the patient's current medications.  Current Outpatient Medications  Medication Sig Dispense Refill   amantadine (SYMMETREL) 100 MG capsule Take 1 capsule (100 mg total) by mouth in the morning, at noon, and at bedtime. 270 capsule 1   carbidopa-levodopa (SINEMET CR) 50-200 MG tablet TAKE ONE TABLET BY MOUTH AT BEDTIME 90 tablet 1   carbidopa-levodopa (SINEMET IR) 25-100 MG tablet Take 2 tablets by mouth as directed. Can take up to 8 tablets a day. 720 tablet 1   diazepam (VALIUM) 5 MG tablet 1-1.5 tabs po BID PRN 90 tablet 2   estradiol (ESTRACE) 0.1 MG/GM vaginal cream Use 1/2 g vaginally every night for the first 2 weeks, then use 1/2 g vaginally two or three times per week as needed to maintain symptom relief. 42.5 g 2   modafinil (PROVIGIL) 200 MG tablet 1/2 tablet in AM for a week, then 1 each AM 30 tablet 1   promethazine-dextromethorphan (PROMETHAZINE-DM) 6.25-15 MG/5ML syrup Take 5 mLs by mouth 4 (four) times daily as needed. 118 mL 0   Vitamin D, Ergocalciferol, (DRISDOL) 1.25 MG (50000 UNIT) CAPS capsule Take 1 capsule (50,000 Units total) by mouth every 7 (seven) days. 15 capsule 3   DULoxetine (CYMBALTA) 30 MG capsule Take 1 capsule (30 mg total) by mouth 2 (two) times daily. 180 capsule 0   traZODone (DESYREL) 50 MG tablet Take 1-2 tablets (50-100 mg total) by mouth at bedtime. 180 tablet 1   No current facility-administered medications for this visit.    Medication Side Effects: None  Allergies:   Allergies  Allergen Reactions   Ciprofloxacin Other (See Comments)    SEVERE JOINT PAIN Other reaction(s): Unknown   Pramipexole Other (See Comments)    Could not get up for 24 hours   Oxycodone-Acetaminophen     Other reaction(s): Unknown    Past Medical History:  Diagnosis Date   Abnormal Pap smear of cervix 1990   unsure abnormality, colpo OK--hx of cryotherapy to cervix   Parkinson disease ([place]) 12/15    Family History  Problem Relation Age of Onset   Heart disease Mother    Alcohol abuse Father    Healthy Sister    Diabetes Maternal Grandmother    Hypertension Maternal Grandmother    Diabetes Maternal Grandfather    Hypertension Maternal Grandfather    Stroke Paternal Grandmother    Heart attack Paternal Grandfather    Healthy Daughter  Social History   Socioeconomic History   Marital status: Married    Spouse name: Not on file   Number of children: 1   Years of education: Not on file   Highest education level: Associate degree: academic program  Occupational History   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[employer]    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  Tobacco Use   Smoking status: Former    Packs/day: 0.50    Types: Cigarettes    Quit date: 07/04/2016    Years since quitting: 5.3   Smokeless tobacco: Never  Vaping Use   Vaping Use: Never used  Substance and Sexual Activity   Alcohol use: No    Alcohol/week: 0.0 standard drinks   Drug use: No   Sexual activity: Yes    Partners: Male    Birth control/protection: Post-menopausal  Other Topics Concern   Not on file  Social History Narrative   Not on file   Social Determinants of Health   Financial Resource Strain: Not on file  Food Insecurity: Not on file  Transportation Needs: Not on file  Physical Activity: Not on file  Stress: Not on file  Social Connections: Not on file  Intimate Partner Violence: Not on file    Past Medical History, Surgical history, Social history, and  Family history were reviewed and updated as appropriate.   Please see review of systems for further details on the patient's review from today.   Objective:   Physical Exam:  LMP 01/14/2016 (Exact Date)   Physical Exam Constitutional:      General: She is not in acute distress. Musculoskeletal:        General: No deformity.  Neurological:     Mental Status: She is alert and oriented to person, place, and time.     Motor: No weakness.     Coordination: Coordination normal.  Psychiatric:        Attention and Perception: Attention and perception normal. She does not perceive auditory or visual hallucinations.        Mood and Affect: Mood is less depressed. Mood is not anxious. Affect is not labile, blunt, angry, tearful or inappropriate.        Speech: Speech normal.        Behavior: Behavior normal.        Thought Content: Thought content normal. Thought content is not paranoid or delusional. Thought content does not include homicidal or suicidal ideation. Thought content does not include homicidal or suicidal plan.        Cognition and Memory: Cognition and memory normal.        Judgment: Judgment normal.     Comments: Insight intact      Lab Review:     Component Value Date/Time   NA 138 06/07/2021 0855   NA 141 08/14/2019 0828   K 4.8 06/07/2021 0855   CL 103 06/07/2021 0855   CO2 29 06/07/2021 0855   GLUCOSE 91 06/07/2021 0855   BUN 16 06/07/2021 0855   BUN 13 08/14/2019 0828   CREATININE 0.90 06/07/2021 0855   CREATININE 0.77 12/28/2020 1406   CALCIUM 9.3 06/07/2021 0855   PROT 6.5 06/07/2021 0855   PROT 7.0 08/14/2019 0828   ALBUMIN 4.0 06/07/2021 0855   ALBUMIN 4.2 08/14/2019 0828   AST 17 06/07/2021 0855   ALT 4 06/07/2021 0855   ALKPHOS 56 06/07/2021 0855   BILITOT 0.6 06/07/2021 0855   BILITOT 0.6 08/14/2019 0828   GFRNONAA >  60 01/19/2020 1233   GFRAA >60 01/19/2020 1233       Component Value Date/Time   WBC 4.7 06/07/2021 0855   RBC 4.43  06/07/2021 0855   HGB 13.6 06/07/2021 0855   HGB 14.6 08/14/2019 0828   HGB 13.2 04/02/2015 0905   HCT 40.8 06/07/2021 0855   HCT 43.7 08/14/2019 0828   PLT 229.0 06/07/2021 0855   PLT 257 08/14/2019 0828   MCV 92.2 06/07/2021 0855   MCV 94 08/14/2019 0828   MCH 30.8 12/28/2020 1406   MCHC 33.3 06/07/2021 0855   RDW 12.7 06/07/2021 0855   RDW 12.1 08/14/2019 0828   LYMPHSABS 2.1 01/19/2020 1233   MONOABS 0.5 01/19/2020 1233   EOSABS 0.2 01/19/2020 1233   BASOSABS 0.1 01/19/2020 1233    No results found for: POCLITH, LITHIUM   No results found for: PHENYTOIN, PHENOBARB, VALPROATE, CBMZ   .res Assessment: Plan:    Madhuri was seen today for follow-up and fatigue.  Diagnoses and all orders for this visit:  Major depressive disorder with single episode, in partial remission (HCC)  Generalized anxiety disorder -     DULoxetine (CYMBALTA) 30 MG capsule; Take 1 capsule (30 mg total) by mouth 2 (two) times daily.  Panic disorder without agoraphobia -     DULoxetine (CYMBALTA) 30 MG capsule; Take 1 capsule (30 mg total) by mouth 2 (two) times daily.  Fatigue due to depression  Insomnia due to mental condition -     traZODone (DESYREL) 50 MG tablet; Take 1-2 tablets (50-100 mg total) by mouth at bedtime.  Major depressive disorder, recurrent episode, moderate (HCC) -     DULoxetine (CYMBALTA) 30 MG capsule; Take 1 capsule (30 mg total) by mouth 2 (two) times daily.  Greater than 50% of 30 min face to face time with patient was spent on counseling and coordination of care. We discussed Amare has the diagnoses noted above and was not well controlled on Wellbutrin and Lexapro.  Her symptoms of depression and anxiety are better with the switch to duloxetine 30 mg twice daily.   Also dx PD previously tends to have some dizziness if she takes 60 mg at once.  Otherwise she is tolerating the current dosage.  She has had near resolution of depression and resolution of the irritability.   She still has some episodic minor anxiety which is overall improved.  She is functioning better and more active.  She is satisfied with the med response at this time.  Doubt bipolar but described sx and call if gets manic  She still takes diazepam on occasion but is not taking it as excessively.  And has not increased it and this is not the source of her fatigue.  Discussed options for fatigue which is better.  Recommend she restart vitamin D.  She asked for the once weekly supplement. Modafinil can be used off label for fatigue and people with chronic illnesses such as Parkinson's disease.  This may be helpful.  Discussed side effects in detail Modafinil 200 mg every morning prn  Needs better balance with work and stress and it should get better after estate.  Disc recent FMLA and no more is currently available to her.  Disc ways to balance this as much as possible.  Continue current duloxetine 30 BID, try moving both doses back tomorning. trazodone 50-100 mg if needed for sleep  Disc SE in detail and SSRI withdrawal sx.  If it fails call back  We discussed the short-term  risks associated with benzodiazepines including sedation and increased fall risk among others.  Discussed long-term side effect risk including dependence, potential withdrawal symptoms, and the potential eventual dose-related risk of dementia.  But recent studies from 2020 dispute this association between benzodiazepines and dementia risk. Newer studies in 2020 do not support an association with dementia.  Follow-up 3-4 mos  Call if you have any additional symptoms or problems with the medication.  Lynder Parents MD, DFAPA  Please see After Visit Summary for patient specific instructions.  Future Appointments  Date Time Provider [place]  04/13/2022  9:45 AM Tat, Eustace Quail, DO [facility] None  05/03/2022  9:00 AM Cottle, Billey Co., MD [facility] None    No orders of the defined types were placed in this  encounter.    -------------------------------

## 2021-11-03 ENCOUNTER — Encounter: Payer: Self-pay | Admitting: Neurology

## 2021-11-25 ENCOUNTER — Telehealth: Payer: Self-pay | Admitting: Family Medicine

## 2021-11-25 NOTE — Telephone Encounter (Signed)
Sarah Welch would like to make a TOC to Lauren due to the location. Easier for her. I will follow through after both reply. ?

## 2021-12-06 ENCOUNTER — Ambulatory Visit: Payer: Federal, State, Local not specified - PPO | Admitting: Family Medicine

## 2021-12-18 DIAGNOSIS — Z1231 Encounter for screening mammogram for malignant neoplasm of breast: Secondary | ICD-10-CM | POA: Diagnosis not present

## 2021-12-18 LAB — HM MAMMOGRAPHY: HM Mammogram: NORMAL (ref 0–4)

## 2022-01-17 ENCOUNTER — Encounter: Payer: Self-pay | Admitting: Neurology

## 2022-01-17 ENCOUNTER — Ambulatory Visit: Payer: Federal, State, Local not specified - PPO | Admitting: Nurse Practitioner

## 2022-01-17 ENCOUNTER — Other Ambulatory Visit: Payer: Self-pay | Admitting: Psychiatry

## 2022-01-17 ENCOUNTER — Encounter: Payer: Self-pay | Admitting: Nurse Practitioner

## 2022-01-17 VITALS — BP 121/80 | HR 96 | Temp 96.4°F | Wt 137.8 lb

## 2022-01-17 DIAGNOSIS — F419 Anxiety disorder, unspecified: Secondary | ICD-10-CM

## 2022-01-17 DIAGNOSIS — G20 Parkinson's disease: Secondary | ICD-10-CM | POA: Diagnosis not present

## 2022-01-17 DIAGNOSIS — R634 Abnormal weight loss: Secondary | ICD-10-CM | POA: Diagnosis not present

## 2022-01-17 DIAGNOSIS — G20A1 Parkinson's disease without dyskinesia, without mention of fluctuations: Secondary | ICD-10-CM

## 2022-01-17 DIAGNOSIS — R21 Rash and other nonspecific skin eruption: Secondary | ICD-10-CM | POA: Diagnosis not present

## 2022-01-17 DIAGNOSIS — F410 Panic disorder [episodic paroxysmal anxiety] without agoraphobia: Secondary | ICD-10-CM

## 2022-01-17 LAB — CBC WITH DIFFERENTIAL/PLATELET
Basophils Absolute: 0 10*3/uL (ref 0.0–0.1)
Basophils Relative: 0.7 % (ref 0.0–3.0)
Eosinophils Absolute: 0.1 10*3/uL (ref 0.0–0.7)
Eosinophils Relative: 1.3 % (ref 0.0–5.0)
HCT: 41.9 % (ref 36.0–46.0)
Hemoglobin: 14.2 g/dL (ref 12.0–15.0)
Lymphocytes Relative: 32 % (ref 12.0–46.0)
Lymphs Abs: 1.5 10*3/uL (ref 0.7–4.0)
MCHC: 34 g/dL (ref 30.0–36.0)
MCV: 92.3 fl (ref 78.0–100.0)
Monocytes Absolute: 0.3 10*3/uL (ref 0.1–1.0)
Monocytes Relative: 6.6 % (ref 3.0–12.0)
Neutro Abs: 2.8 10*3/uL (ref 1.4–7.7)
Neutrophils Relative %: 59.4 % (ref 43.0–77.0)
Platelets: 205 10*3/uL (ref 150.0–400.0)
RBC: 4.54 Mil/uL (ref 3.87–5.11)
RDW: 12.9 % (ref 11.5–15.5)
WBC: 4.7 10*3/uL (ref 4.0–10.5)

## 2022-01-17 LAB — COMPREHENSIVE METABOLIC PANEL
ALT: 6 U/L (ref 0–35)
AST: 18 U/L (ref 0–37)
Albumin: 4.6 g/dL (ref 3.5–5.2)
Alkaline Phosphatase: 55 U/L (ref 39–117)
BUN: 12 mg/dL (ref 6–23)
CO2: 29 mEq/L (ref 19–32)
Calcium: 9.4 mg/dL (ref 8.4–10.5)
Chloride: 104 mEq/L (ref 96–112)
Creatinine, Ser: 0.83 mg/dL (ref 0.40–1.20)
GFR: 81.15 mL/min (ref >60.00)
Glucose, Bld: 88 mg/dL (ref 70–99)
Potassium: 3.9 mEq/L (ref 3.5–5.1)
Sodium: 139 mEq/L (ref 135–145)
Total Bilirubin: 0.8 mg/dL (ref 0.2–1.2)
Total Protein: 7.1 g/dL (ref 6.0–8.3)

## 2022-01-17 LAB — HEMOGLOBIN A1C: Hgb A1c MFr Bld: 5.2 % (ref 4.6–6.5)

## 2022-01-17 LAB — VITAMIN D 25 HYDROXY (VIT D DEFICIENCY, FRACTURES): VITD: 67.38 ng/mL (ref 30.00–100.00)

## 2022-01-17 LAB — VITAMIN B12: Vitamin B-12: 333 pg/mL (ref 211–911)

## 2022-01-17 LAB — TSH: TSH: 0.78 u[IU]/mL (ref 0.35–5.50)

## 2022-01-17 NOTE — Assessment & Plan Note (Signed)
She has lost about 26 pounds in the last 3.5 months.  She states that her eating habits have at the same, she denies decreased appetite.  We will check CMP, CBC, TSH, HIV, hepatitis C, vitamin B12, and vitamin D.  She had a colonoscopy done 2 years ago.  Encouraged her to make sure that she is eating foods high in protein.  She does have a history of Parkinson's and anxiety which could also be playing a role.  Follow-up in 4 weeks. ?

## 2022-01-17 NOTE — Progress Notes (Signed)
? ?Established Patient Office Visit ? ?Subjective   ?Patient ID: Sarah Welch, female    DOB: 1971-05-08  Age: 51 y.o. MRN: [id number] ? ?Chief Complaint  ?Patient presents with  ? Transitions Of Care  ?  Est care. TOC. Pt c/o red splotchy spots on both legs for several days and weight loss concerns.   ? ? ?HPI ? ?Sarah Welch presents for new patient visit to transfer care to a new provider.  Introduced to Designer, jewellery role and practice setting.  All questions answered.  Discussed provider/patient relationship and expectations. ? ?She states that she has lost 26 pounds since January unintentionally. She has been eating the same amounts of foods. She tends to eat a lot of fruits, vegetables, oatmeal, and yogurt. She denies chest pain, shortness, blood in her stool, fevers, and night sweats.  ? ?She noticed a red, splotchy rash to her legs for several days.  She states that they do not burn or itch.  She has not tried any treatment over-the-counter for it.  She has not changed soaps, laundry detergent, any new foods.  She denies shortness of breath. ? ?She has a history of anxiety and stress at work causing worsening symptoms. She sees Dr. Clovis Pu with psychiatry. She takes trazodone at bedtime to help her sleep, duloxetine '60mg'$  daily, and diazepam as needed. ? ? ?  01/17/2022  ? 10:09 AM 06/07/2021  ?  8:44 AM 02/12/2021  ?  8:51 AM 04/02/2020  ?  1:19 PM 10/25/2019  ?  1:51 PM  ?Depression screen PHQ 2/9  ?Decreased Interest '1 3 3 1 2  '$ ?Down, Depressed, Hopeless  '2 2 1 1  '$ ?PHQ - 2 Score '1 5 5 2 3  '$ ?Altered sleeping '1 1 1 2 3  '$ ?Tired, decreased energy '3 3 3 1 3  '$ ?Change in appetite '1 2 3 1 1  '$ ?Feeling bad or failure about yourself  0 0  0 0  ?Trouble concentrating 1 0 0 0 1  ?Moving slowly or fidgety/restless 0 0 0 0 0  ?Suicidal thoughts 0 0 0 0 0  ?PHQ-9 Score '7 11 12 6 11  '$ ?Difficult doing work/chores  Extremely dIfficult  Somewhat difficult Very difficult  ? ? ?  01/17/2022  ? 10:10 AM 02/12/2021  ?  8:57 AM  04/02/2020  ?  1:20 PM 10/25/2019  ?  1:52 PM  ?GAD 7 : Generalized Anxiety Score  ?Nervous, Anxious, on Edge '3 3 1 2  '$ ?Control/stop worrying '2 3 1 1  '$ ?Worry too much - different things 2 3 0 1  ?Trouble relaxing  '2 1 2  '$ ?Restless 3 1 0 0  ?Easily annoyed or irritable '1 2 1 1  '$ ?Afraid - awful might happen 0 0 0 0  ?Total GAD 7 Score  '14 4 7  '$ ?Anxiety Difficulty Very difficult Extremely difficult Somewhat difficult Very difficult  ? ? ?Past Medical History:  ?Diagnosis Date  ? Abnormal Pap smear of cervix 1990  ? unsure abnormality, colpo OK--hx of cryotherapy to cervix  ? Parkinson disease ([place]) 12/15  ? ?Past Surgical History:  ?Procedure Laterality Date  ? AUGMENTATION MAMMAPLASTY Bilateral 2004  ? implants, saline  ? COLPOSCOPY    ? DILATION AND CURETTAGE OF UTERUS  1992  ? following SAB  ? ?Social History  ? ?Socioeconomic History  ? Marital status: Married  ?  Spouse name: Not on file  ? Number of children: 1  ? Years of education: Not on file  ?  Highest education level: Associate degree: academic program  ?Occupational History  ? 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  Employer: [employer]  ?  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 Smoking status: Former  ?  Packs/day: 0.50  ?  Years: 25.00  ?  Pack years: 12.50  ?  Types: Cigarettes  ?  Quit date: 07/04/2016  ?  Years since quitting: 5.5  ? Smokeless tobacco: Never  ?Vaping Use  ? Vaping Use: Never used  ?Substance and Sexual Activity  ? Alcohol use: No  ?  Alcohol/week: 0.0 standard drinks  ? Drug use: No  ? Sexual activity: Yes  ?  Partners: Male  ?  Birth control/protection: Post-menopausal  ?Other Topics Concern  ? Not on file  ?Social History Narrative  ? Not on file  ? ?Social Determinants of Health  ? ?Financial Resource Strain: Not on file  ?Food Insecurity: Not on file  ?Transportation Needs: Not on file  ?Physical Activity: Not on file  ?Stress: Not on file  ?Social Connections: Not on file  ?Intimate Partner Violence: Not on file  ? ?Family  History  ?Problem Relation Age of Onset  ? Heart disease Mother   ? Alcohol abuse Father   ? Healthy Sister   ? Diabetes Maternal Grandmother   ? Hypertension Maternal Grandmother   ? Diabetes Maternal Grandfather   ? Hypertension Maternal Grandfather   ? Stroke Paternal Grandmother   ? Heart attack Paternal Grandfather   ? Healthy Daughter   ? ?Allergies  ?Allergen Reactions  ? Ciprofloxacin Other (See Comments)  ?  SEVERE JOINT PAIN ?Other reaction(s): Unknown  ? Pramipexole Other (See Comments)  ?  Could not get up for 24 hours  ? Oxycodone-Acetaminophen   ?  Other reaction(s): Unknown  ? ?  ? ?Review of Systems  ?Constitutional:  Positive for malaise/fatigue and weight loss. Negative for fever.  ?HENT: Negative.    ?Eyes: Negative.   ?Respiratory: Negative.    ?Cardiovascular: Negative.   ?Gastrointestinal: Negative.   ?Genitourinary: Negative.   ?Musculoskeletal:  Positive for myalgias.  ?Skin:  Positive for rash (mostly gone).  ?Neurological:  Positive for dizziness (intermittent). Negative for headaches.  ?Psychiatric/Behavioral:  Negative for depression. The patient is nervous/anxious.   ? ?  ?Objective:  ?  ? ?BP 121/80 (BP Location: Right Arm, Patient Position: Sitting, Cuff Size: Normal)   Pulse 96   Temp (!) 96.4 ?F (35.8 ?C) (Temporal)   Wt 137 lb 12.8 oz (62.5 kg)   LMP 01/14/2016 (Exact Date)   SpO2 99%   BMI 23.65 kg/m?  ?BP Readings from Last 3 Encounters:  ?01/17/22 121/80  ?09/28/21 123/74  ?06/07/21 110/72  ? ?Wt Readings from Last 3 Encounters:  ?01/17/22 137 lb 12.8 oz (62.5 kg)  ?09/28/21 153 lb (69.4 kg)  ?09/15/21 161 lb (73 kg)  ?  ? ?Physical Exam ?Vitals and nursing note reviewed.  ?Constitutional:   ?   General: She is not in acute distress. ?   Appearance: Normal appearance.  ?HENT:  ?   Head: Normocephalic and atraumatic.  ?   Right Ear: Tympanic membrane, ear canal and external ear normal.  ?   Left Ear: Tympanic membrane, ear canal and external ear normal.  ?   Nose: Nose  normal.  ?   Mouth/Throat:  ?   Mouth: Mucous membranes are moist.  ?   Pharynx: Oropharynx is clear.  ?Eyes:  ?   Conjunctiva/sclera: Conjunctivae normal.  ?  Cardiovascular:  ?   Rate and Rhythm: Normal rate and regular rhythm.  ?   Pulses: Normal pulses.  ?   Heart sounds: Normal heart sounds.  ?Pulmonary:  ?   Effort: Pulmonary effort is normal.  ?   Breath sounds: Normal breath sounds.  ?Abdominal:  ?   Palpations: Abdomen is soft.  ?   Tenderness: There is no abdominal tenderness.  ?Musculoskeletal:     ?   General: Normal range of motion.  ?   Cervical back: Normal range of motion.  ?Skin: ?   General: Skin is warm and dry.  ?Neurological:  ?   Mental Status: She is alert and oriented to person, place, and time. Mental status is at baseline.  ?Psychiatric:     ?   Mood and Affect: Mood normal.     ?   Behavior: Behavior normal.     ?   Thought Content: Thought content normal.     ?   Judgment: Judgment normal.  ? ? ?No results found for any visits on 01/17/22. ? ?Last CBC ?Lab Results  ?Component Value Date  ? WBC 4.7 06/07/2021  ? HGB 13.6 06/07/2021  ? HCT 40.8 06/07/2021  ? MCV 92.2 06/07/2021  ? MCH 30.8 12/28/2020  ? RDW 12.7 06/07/2021  ? PLT 229.0 06/07/2021  ? ?Last metabolic panel ?Lab Results  ?Component Value Date  ? GLUCOSE 91 06/07/2021  ? NA 138 06/07/2021  ? K 4.8 06/07/2021  ? CL 103 06/07/2021  ? CO2 29 06/07/2021  ? BUN 16 06/07/2021  ? CREATININE 0.90 06/07/2021  ? GFRNONAA >60 01/19/2020  ? CALCIUM 9.3 06/07/2021  ? PROT 6.5 06/07/2021  ? ALBUMIN 4.0 06/07/2021  ? LABGLOB 2.8 08/14/2019  ? AGRATIO 1.5 08/14/2019  ? BILITOT 0.6 06/07/2021  ? ALKPHOS 56 06/07/2021  ? AST 17 06/07/2021  ? ALT 4 06/07/2021  ? ANIONGAP 9 01/19/2020  ? ?Last lipids ?Lab Results  ?Component Value Date  ? CHOL 163 06/07/2021  ? HDL 60.80 06/07/2021  ? [place] 81 06/07/2021  ? TRIG 105.0 06/07/2021  ? CHOLHDL 3 06/07/2021  ? ?Last hemoglobin A1c ?Lab Results  ?Component Value Date  ? HGBA1C 5.3 06/07/2021  ? ?Last  thyroid functions ?Lab Results  ?Component Value Date  ? TSH 0.90 06/07/2021  ? ?  ?The 10-year ASCVD risk score (Arnett DK, et al., 2019) is: 2.5% ? ?  ?Assessment & Plan:  ? ?Problem List Items Addressed This Visit   ? ?  ?

## 2022-01-17 NOTE — Assessment & Plan Note (Signed)
Chronic, not controlled.  She states that her anxiety has worsened recently and is under a lot of stress at work.  She sees Dr. Clovis Pu with psychiatry and he is currently managing her with medications.  Encouraged her to reach out to him sooner before her next appointment if her symptoms are interfering with her daily life and not controlled. ?

## 2022-01-17 NOTE — Assessment & Plan Note (Signed)
Chronic, stable.  Follows with neurology.  Continue collaboration recommendations from neurology. ?

## 2022-01-17 NOTE — Patient Instructions (Signed)
It was great to see you! ? ?We are checking your labs today and will let you know the results via mychart/phone.  ? ?Make sure that you are eating routinely and focusing on foods high in protein.  ? ?Let's follow-up in 4 weeks, sooner if you have concerns. ? ?If a referral was placed today, you will be contacted for an appointment. Please note that routine referrals can sometimes take up to 3-4 weeks to process. Please call our office if you haven't heard anything after this time frame. ? ?Take care, ? ?Vance Peper, NP ? ?

## 2022-01-18 LAB — HEPATITIS C ANTIBODY
Hepatitis C Ab: NONREACTIVE
SIGNAL TO CUT-OFF: 0.07 (ref <1.00)

## 2022-01-18 LAB — HIV ANTIBODY (ROUTINE TESTING W REFLEX): HIV 1&2 Ab, 4th Generation: NONREACTIVE

## 2022-01-26 ENCOUNTER — Other Ambulatory Visit: Payer: Self-pay | Admitting: Psychiatry

## 2022-01-26 DIAGNOSIS — F5105 Insomnia due to other mental disorder: Secondary | ICD-10-CM

## 2022-01-28 ENCOUNTER — Other Ambulatory Visit: Payer: Self-pay | Admitting: Psychiatry

## 2022-01-28 DIAGNOSIS — F5105 Insomnia due to other mental disorder: Secondary | ICD-10-CM

## 2022-02-01 ENCOUNTER — Ambulatory Visit: Payer: Federal, State, Local not specified - PPO | Admitting: Family Medicine

## 2022-02-01 ENCOUNTER — Encounter: Payer: Self-pay | Admitting: Family Medicine

## 2022-02-01 VITALS — BP 118/78 | HR 92 | Temp 96.9°F | Ht 64.0 in | Wt 136.0 lb

## 2022-02-01 DIAGNOSIS — R053 Chronic cough: Secondary | ICD-10-CM

## 2022-02-01 DIAGNOSIS — Z1211 Encounter for screening for malignant neoplasm of colon: Secondary | ICD-10-CM | POA: Insufficient documentation

## 2022-02-01 DIAGNOSIS — R051 Acute cough: Secondary | ICD-10-CM | POA: Diagnosis not present

## 2022-02-01 DIAGNOSIS — K5903 Drug induced constipation: Secondary | ICD-10-CM | POA: Insufficient documentation

## 2022-02-01 MED ORDER — FLUTICASONE PROPIONATE 50 MCG/ACT NA SUSP: 2.0000 | Freq: Every day | NASAL | 6 refills | Status: DC

## 2022-02-01 MED ORDER — BENZONATATE 100 MG PO CAPS: 100.0000 mg | ORAL_CAPSULE | Freq: Two times a day (BID) | ORAL | 0 refills | Status: DC | PRN

## 2022-02-01 NOTE — Progress Notes (Signed)
? ?  Acute Office Visit ? ?Subjective:  ? ?  ?Patient ID: Sarah Welch, female    DOB: October 26, 1970, 51 y.o.   MRN: [id number] ? ?Chief Complaint  ?Patient presents with  ? Acute Visit  ?  Pt c/o dry coughing w/ tickle and scratchy throat x3 months but has gotten worse. Congested  ?Thursday and Friday throat did hurt. ?Tested for covid and strap today in office all negative.  ? ? ?HPI ?Patient is in today for cough.  Patient has had this cough for 3 months, although has been worse over the past 4 days.  Patient says that the cough started off as a dry cough intermittent 3 months ago.  Then approximately 4 days ago patient got stuffy nose, scratchy and sore throat.  Patient had a strep test and COVID test in the past couple days these were both negative.  Patient denies any fevers, chills, chest pain, hemoptysis, shortness of breath.  Patient has tried OTC remedies without improvement. ? ?ROS ?As per HPI ? ?   ?Objective:  ?  ?BP 118/78 (BP Location: Left Arm, Patient Position: Sitting, Cuff Size: Normal)   Pulse 92   Temp (!) 96.9 ?F (36.1 ?C) (Temporal)   Ht '5\' 4"'$  (1.626 m)   Wt 136 lb (61.7 kg)   LMP 01/14/2016 (Exact Date)   SpO2 98%   BMI 23.34 kg/m?  ? ? ?Gen: NAD, resting comfortably ?CV: RRR with no murmurs appreciated ?Pulm: NWOB, CTAB with no crackles, wheezes, or rhonchi ?Skin: warm, dry ?Neuro: grossly normal, moves all extremities ?Psych: Normal affect and thought content ? ? ?No results found for any visits on 02/01/22. ? ? ?   ?Assessment & Plan:  ?Patient with acute on chronic cough. ?Unsure of exact etiology of chronic cough, however acute cough associated with rhinorrhea could be URI versus seasonal allergies with postnasal drip ?Discussed complexity of acute versus chronic cough ?Chronic cough etiologies could include everything from GERD, allergies, asthma ?Recommend supportive care with Flonase 2 squirts daily, Tessalon Perles 100 mg 3 times daily as needed ?Chest x-ray ?Follow-up in 2  weeks for resolution of acute cough and follow-up for chronic cough ?Problem List Items Addressed This Visit   ?None ?Visit Diagnoses   ? ? Acute cough    -  Primary  ? Relevant Medications  ? fluticasone (FLONASE) 50 MCG/ACT nasal spray  ? benzonatate (TESSALON) 100 MG capsule  ? Other Relevant Orders  ? DG Chest 2 View  ? Chronic cough      ? Relevant Orders  ? DG Chest 2 View  ? ?  ? ? ?Meds ordered this encounter  ?Medications  ? fluticasone (FLONASE) 50 MCG/ACT nasal spray  ?  Sig: Place 2 sprays into both nostrils daily.  ?  Dispense:  16 g  ?  Refill:  6  ? benzonatate (TESSALON) 100 MG capsule  ?  Sig: Take 1 capsule (100 mg total) by mouth 2 (two) times daily as needed for cough.  ?  Dispense:  20 capsule  ?  Refill:  0  ? ? ?No follow-ups on file. ? ?Bonnita Hollow, MD ? ? ?

## 2022-02-02 ENCOUNTER — Ambulatory Visit (INDEPENDENT_AMBULATORY_CARE_PROVIDER_SITE_OTHER)
Admission: RE | Admit: 2022-02-02 | Discharge: 2022-02-02 | Disposition: A | Discharge status: Home / Self Care | Payer: Federal, State, Local not specified - PPO | Source: Ambulatory Visit | Attending: Family Medicine | Admitting: Family Medicine

## 2022-02-02 DIAGNOSIS — R053 Chronic cough: Secondary | ICD-10-CM | POA: Diagnosis not present

## 2022-02-02 DIAGNOSIS — R051 Acute cough: Secondary | ICD-10-CM

## 2022-02-02 DIAGNOSIS — R059 Cough, unspecified: Secondary | ICD-10-CM | POA: Diagnosis not present

## 2022-02-03 NOTE — Telephone Encounter (Signed)
Please request records

## 2022-02-08 ENCOUNTER — Other Ambulatory Visit: Payer: Self-pay

## 2022-02-08 MED ORDER — CARBIDOPA-LEVODOPA 25-100 MG PO TABS: 2.0000 | ORAL_TABLET | ORAL | 0 refills | Status: DC

## 2022-02-11 NOTE — Progress Notes (Unsigned)
   Established Patient Office Visit  Subjective   Patient ID: Sarah Welch, female    DOB: July 26, 1971  Age: 51 y.o. MRN: [id number]  No chief complaint on file.   HPI  Sarah Welch is here today to follow-up on unintentional weight loss.   {History (Optional):23778}  ROS    Objective:     LMP 01/14/2016 (Exact Date)  {Vitals History (Optional):23777}  Physical Exam   No results found for any visits on 02/15/22.  {Labs (Optional):23779}  The 10-year ASCVD risk score (Arnett DK, et al., 2019) is: 2.4%    Assessment & Plan:   Problem List Items Addressed This Visit   None   No follow-ups on file.    Sarah Dancer, NP

## 2022-02-15 ENCOUNTER — Encounter: Payer: Self-pay | Admitting: Nurse Practitioner

## 2022-02-15 ENCOUNTER — Telehealth: Payer: Self-pay | Admitting: Neurology

## 2022-02-15 ENCOUNTER — Ambulatory Visit: Payer: Federal, State, Local not specified - PPO | Admitting: Nurse Practitioner

## 2022-02-15 VITALS — BP 116/70 | HR 94 | Temp 97.1°F | Ht 64.0 in | Wt 134.6 lb

## 2022-02-15 DIAGNOSIS — R634 Abnormal weight loss: Secondary | ICD-10-CM

## 2022-02-15 NOTE — Patient Instructions (Signed)
It was great to see you!  Start an ensure, boost, glucerna shake in between meals 1-2 times per day.   Request your vaccination records from [email or web address]  Let's follow-up in 3 months, sooner if you have concerns.  If a referral was placed today, you will be contacted for an appointment. Please note that routine referrals can sometimes take up to 3-4 weeks to process. Please call our office if you haven't heard anything after this time frame.  Take care,  Vance Peper, NP

## 2022-02-15 NOTE — Telephone Encounter (Signed)
Patient stated she feels her parkinsons has got worse. She would like to know if tat will see her before her July appt. Her cell is broken at the moment. Can call her job [phone] or her husbands number mike [phone]

## 2022-02-15 NOTE — Assessment & Plan Note (Signed)
In the last 4 weeks, she has lost about 1.5 pounds.  She is still eating regularly and not making any changes to her diet.  Last visit she was checked for HIV, hepatitis C, A1c, vitamin D, vitamin B12, TSH, CMP, CBC and all of these labs were unrevealing for her weight loss.  She had a chest x-ray on 02/03/2022 which was negative for any acute findings.  She does have a history of Parkinson's which could be causing the unintentional weight loss.  She states that she has an appointment with her neurologist in July, however may reach out sooner.  Discussed that with these unintentional movements due to her Parkinson's, this is burning more calories and she will need to eat more than normal to keep her current weight.  She can start drinking Ensure, boost, Glucerna or other similar supplement in between meals to help increase protein and calories.  We will also send her home with 3 Hemoccult cards.

## 2022-02-16 NOTE — Telephone Encounter (Signed)
Called patient back and she stated that she has lost 20 lbs without trying. She has seen her PCP about this and lab work has been ok. Patient has complaints of leg cramps, leg turning in, stiffness, neck bending down, and a vibration feeling inside her body. States this has been going on for about 3 months. Patient states she is now back to taking 3 tablets a day of amantadine because the stiffness and cramps were getting bad.  Patient aware that I will send Dr. Carles Collet this message and give her a call back once I hear back. Patient informed me her cell phone was fixed so we may call her on her cell.

## 2022-02-16 NOTE — Telephone Encounter (Signed)
Patient is scheduled for 03/15/22.

## 2022-02-22 ENCOUNTER — Other Ambulatory Visit: Payer: Self-pay | Admitting: Neurology

## 2022-02-22 DIAGNOSIS — G20 Parkinson's disease: Secondary | ICD-10-CM

## 2022-03-03 NOTE — Addendum Note (Signed)
Addended by: Augustina Mood on: 03/03/2022 02:57 PM   Modules accepted: Orders

## 2022-03-15 ENCOUNTER — Ambulatory Visit: Payer: Federal, State, Local not specified - PPO | Admitting: Neurology

## 2022-03-15 ENCOUNTER — Encounter: Payer: Self-pay | Admitting: Neurology

## 2022-03-15 VITALS — BP 132/80 | HR 88 | Ht 64.0 in | Wt 130.0 lb

## 2022-03-15 DIAGNOSIS — G249 Dystonia, unspecified: Secondary | ICD-10-CM | POA: Diagnosis not present

## 2022-03-15 DIAGNOSIS — G20 Parkinson's disease: Secondary | ICD-10-CM | POA: Diagnosis not present

## 2022-03-15 DIAGNOSIS — F411 Generalized anxiety disorder: Secondary | ICD-10-CM | POA: Diagnosis not present

## 2022-03-15 MED ORDER — ROPINIROLE HCL 1 MG PO TABS: 1.0000 mg | ORAL_TABLET | Freq: Three times a day (TID) | ORAL | 1 refills | Status: DC

## 2022-03-15 MED ORDER — ROPINIROLE HCL 0.25 MG PO TABS: ORAL_TABLET | ORAL | 0 refills | Status: DC

## 2022-04-07 ENCOUNTER — Ambulatory Visit: Payer: Federal, State, Local not specified - PPO | Admitting: Psychiatry

## 2022-04-07 ENCOUNTER — Encounter: Payer: Self-pay | Admitting: Psychiatry

## 2022-04-07 DIAGNOSIS — F410 Panic disorder [episodic paroxysmal anxiety] without agoraphobia: Secondary | ICD-10-CM

## 2022-04-07 DIAGNOSIS — F430 Acute stress reaction: Secondary | ICD-10-CM

## 2022-04-07 DIAGNOSIS — F411 Generalized anxiety disorder: Secondary | ICD-10-CM

## 2022-04-07 DIAGNOSIS — F331 Major depressive disorder, recurrent, moderate: Secondary | ICD-10-CM

## 2022-04-07 DIAGNOSIS — F5105 Insomnia due to other mental disorder: Secondary | ICD-10-CM

## 2022-04-07 DIAGNOSIS — F419 Anxiety disorder, unspecified: Secondary | ICD-10-CM

## 2022-04-07 DIAGNOSIS — R7989 Other specified abnormal findings of blood chemistry: Secondary | ICD-10-CM

## 2022-04-07 DIAGNOSIS — F324 Major depressive disorder, single episode, in partial remission: Secondary | ICD-10-CM | POA: Diagnosis not present

## 2022-04-07 DIAGNOSIS — F32A Depression, unspecified: Secondary | ICD-10-CM

## 2022-04-07 DIAGNOSIS — R5383 Other fatigue: Secondary | ICD-10-CM

## 2022-04-07 MED ORDER — VITAMIN D (ERGOCALCIFEROL) 1.25 MG (50000 UNIT) PO CAPS: 50000.0000 [IU] | ORAL_CAPSULE | ORAL | 3 refills | Status: DC

## 2022-04-07 MED ORDER — DIAZEPAM 5 MG PO TABS: ORAL_TABLET | ORAL | 2 refills | Status: DC

## 2022-04-07 MED ORDER — TRAZODONE HCL 50 MG PO TABS: 50.0000 mg | ORAL_TABLET | Freq: Every day | ORAL | 1 refills | Status: DC

## 2022-04-07 MED ORDER — DULOXETINE HCL 30 MG PO CPEP: 30.0000 mg | ORAL_CAPSULE | Freq: Two times a day (BID) | ORAL | 0 refills | Status: DC

## 2022-04-07 MED ORDER — SERTRALINE HCL 50 MG PO TABS: ORAL_TABLET | ORAL | 1 refills | Status: DC

## 2022-04-07 NOTE — Progress Notes (Signed)
Sarah Welch [id number] November 26, 1970 51 y.o.  Subjective:   [name] is a 51 y.o. (DOB [birth year]) female.  Chief Complaint:  Chief Complaint  Patient presents with   Follow-up   Depression   Anxiety    HPI Sarah Welch presents to the office today for follow-up of depression and anxiety.  01/2020 appt with the following noted and no med changes: Going good. Better motivated and she feelks better.  Occ anxiety spontaneous with diazepam helpful.  Overall anxiety level of anxiety is better and less depression.  Most improvement in the last 3 weeks with better activity.  Irritability resolved. Tolerating duloxetine prefers AM and noon.    05/18/2020 appt wit the following noted: Job still stressful but meds are fine.  Short staffed.  Stress eating.  Work out 3 times weekly and plans more.  Fears vaccine mandate will cause more employees to leave.   Diazepam once or twice weekly.  No panic.  11/16/2020 appointment with following noted: Still on duloxetine 30 BID and rare diazepam.  PD meds without change. Sleep problems for a couple of months.  EMA and initial insomnia. Caffeine before lunch 2 cups.  Some nights only 2 hours sleep. Depression might be a little worse, but tired of not sleeping.  Anxiety is OK.  Work is better.  Average 3 hours sleep per night. Plan: start trazodone 50 and continue duloxetine 30 BID  01/12/21 appt noted:  Trazodone partly helped.  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  Don't know where it's coming from.  Not like that in years.  H notices but not complaining.  Harder to get to sleep those days.  It's gone in the morning.  Going on for 3 weeks. No history of mania.  Plan: Continue current duloxetine 30 BID, try moving both doses back tomorning. trazodone can increase to 100 mg if needed for sleep  02/05/2021 urgent appointment scheduled at patient  request: Day after seen.  M died in her sleep.  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  Old dog is dying.  Straw broke camel's back was job stressor.  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  Sleep is OK.  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  Has told people she can't do it.    04/29/21 Average diazepam about 4 days per week. Continues duloxetine 30 BID, Trazodone 50-100 HS. No SE. Still dealing with estate things and work stress.  Got off 6 weeks from work but not STD.   Function at work seems OK.  Still gets anxiety attacks with SOB at work about once daily but fights through it.  Still short staffed at work. Sleep 10-530 usually. Not able to find much enjoyment usually.   Feels she is overextended and that makes it hard to relax into after hour activites.  Hope when estate is resolved things will get better. Plan: Continue current duloxetine 30 BID, try moving both doses back tomorning. trazodone can increase to 100 mg if needed for sleep  07/28/21 appt noted: Sleep all the time if can now.  So tired for a couple of weeks. Taking diazepam more rarely.  Some weeks none. No med changes. NO change PD meds. Don't know if depressed.  No reason.  Too tired to enjoy things.  Not a lot  of nervousness. 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  Energy better but not normal.   Patient reports stable mood and denies depressed or irritable moods.  Patient denies any recent difficulty with anxiety.  Patient denies difficulty with sleep initiation or maintenance.  Patient reports that energy and motivation have been good.  Patient denies any difficulty with concentration.  Patient denies any suicidal ideation. Modafinil 200 noticeable but hasn't needed it in awhile. Can get overwhelmed with workload but otherwise anxiety  ok Lost weight to 143# without trying.  Highest ever 160# Plan: no med changes  04/07/22 appt noted: Fine until a month ago anxiety attacks out of nowhere.  At [facility], started sobbing without reason or trigger other than being hot. Drove home.  Then got in a rage with trigger going home.  No accidents on incidents.  That was the worst but other episodes anxiety with hands tingling. No other rage episodes except quick temper as a kid.   OK the next day other than tired. Lost wt without trying.  Current wt about 130#.   Uses pill box and doesn't think she's missing any meds. Not generally more anxious usually.  Nor irrtiable. Job is still real stressful and PD worse in afternoon. Dep 6-7/10 Saturdays wants to shop usually but now getting anxious about it. Sx worse in last 5 weeks.  No trigger. Fri-Sunday EMA and crashed Monday.  EMA in the last week.  Exercises with PD group and feels good physically. No family history of bipolar.  Past Psychiatric History:  No psychiatrist or counselor. Past Psychiatric Medication Trials: Celexa, Lexapro, Wellbutrin SE, duloxetine 30 BID Diazepam Remote Ambien  Review of Systems:  Review of Systems  Cardiovascular: Negative for chest pain and palpitations.  Neurological: Positive for tremors. Negative for dizziness and weakness.    Medications: I have reviewed the patient's current medications.  Current Outpatient Medications  Medication Sig Dispense Refill   amantadine (SYMMETREL) 100 MG capsule Take 1 capsule (100 mg total) by mouth in the morning, at noon, and at bedtime. 270 capsule 1   carbidopa-levodopa (SINEMET CR) 50-200 MG tablet TAKE ONE TABLET BY MOUTH AT BEDTIME 90 tablet 1   carbidopa-levodopa (SINEMET IR) 25-100 MG tablet Take 2 tablets by mouth as directed. Can take up to 8 tablets a day. 720 tablet 0   estradiol (ESTRACE) 0.1 MG/GM vaginal cream Use 1/2 g vaginally every night for the first 2 weeks, then use 1/2 g vaginally two or  three times per week as needed to maintain symptom relief. 42.5 g 2   fluticasone (FLONASE) 50 MCG/ACT nasal spray Place 2 sprays into both nostrils daily. 16 g 6   sertraline (ZOLOFT) 50 MG tablet Take 1/2 tab po qd x 7 then 1 tab daily 30 tablet 1   benzonatate (TESSALON) 100 MG capsule Take 1 capsule (100 mg total) by mouth 2 (two) times daily as needed for cough. (Patient not taking: Reported on 03/15/2022) 20 capsule 0   diazepam (VALIUM) 5 MG tablet TAKE ONE TO ONE AND A HALF TABLETS BY MOUTH TWICE DAILY AS NEEDED 90 tablet 2   DULoxetine (CYMBALTA) 30 MG capsule Take 1 capsule (30 mg total) by mouth 2 (two) times daily. 180 capsule 0   rOPINIRole (REQUIP) 0.25 MG tablet 1 po tid x 1 week, then 2 po tid x 1 week, then  3 po tid (Patient not taking: Reported on 04/07/2022) 73 tablet 0   rOPINIRole (REQUIP) 1 MG tablet Take 1 tablet (1 mg total) by mouth 3 (three) times daily. (Patient not taking: Reported on 04/07/2022) 270 tablet 1   traZODone (DESYREL) 50 MG tablet Take 1-2 tablets (50-100 mg total) by mouth at bedtime. 180 tablet 1   Vitamin D, Ergocalciferol, (DRISDOL) 1.25 MG (50000 UNIT) CAPS capsule Take 1 capsule (50,000 Units total) by mouth every 7 (seven) days. 15 capsule 3   No current facility-administered medications for this visit.    Medication Side Effects: None  Allergies:  Allergies  Allergen Reactions   Ciprofloxacin Other (See Comments)    SEVERE JOINT PAIN Other reaction(s): Unknown   Pramipexole Other (See Comments)    Could not get up for 24 hours   Oxycodone-Acetaminophen     Other reaction(s): Unknown    Past Medical History:  Diagnosis Date   Abnormal Pap smear of cervix 1990   unsure abnormality, colpo OK--hx of cryotherapy to cervix   Parkinson disease ([place]) 12/15    Family History  Problem Relation Age of Onset   Heart disease Mother    Alcohol abuse Father    Healthy Sister    Diabetes Maternal Grandmother    Hypertension Maternal Grandmother     Diabetes Maternal Grandfather    Hypertension Maternal Grandfather    Stroke Paternal Grandmother    Heart attack Paternal Grandfather    Healthy Daughter     Social History   Socioeconomic History   Marital status: Married    Spouse name: Not on file   Number of children: 1   Years of education: Not on file   Highest education level: Associate degree: academic program  Occupational History   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[employer]    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  Tobacco Use   Smoking status: Former    Packs/day: 0.50    Years: 25.00    Total pack years: 12.50    Types: Cigarettes    Quit date: 07/04/2016    Years since quitting: 5.7   Smokeless tobacco: Never  Vaping Use   Vaping Use: Never used  Substance and Sexual Activity   Alcohol use: No    Alcohol/week: 0.0 standard drinks of alcohol   Drug use: No   Sexual activity: Yes    Partners: Male    Birth control/protection: Post-menopausal  Other Topics Concern   Not on file  Social History Narrative   Right handed   Lives with husband on a 2 level home   Caffeine 1 cup day   Social Determinants of Health   Financial Resource Strain: Not on file  Food Insecurity: Not on file  Transportation Needs: Not on file  Physical Activity: Not on file  Stress: Not on file  Social Connections: Not on file  Intimate Partner Violence: Not on file    Past Medical History, Surgical history, Social history, and Family history were reviewed and updated as appropriate.   Please see review of systems for further details on the patient's review from today.   Objective:   Physical Exam:  LMP 01/14/2016 (Exact Date)   Physical Exam Constitutional:      General: She is not in acute distress. Musculoskeletal:        General: No deformity.  Neurological:     Mental Status: She is alert and oriented to person, place, and time.  Motor: No weakness.     Coordination: Coordination normal.   Psychiatric:        Attention and Perception: Attention and perception normal. She does not perceive auditory or visual hallucinations.        Mood and Affect: Mood is less depressed. Mood is not anxious. Affect is not labile, blunt, angry, tearful or inappropriate.        Speech: Speech normal.        Behavior: Behavior normal.        Thought Content: Thought content normal. Thought content is not paranoid or delusional. Thought content does not include homicidal or suicidal ideation. Thought content does not include homicidal or suicidal plan.        Cognition and Memory: Cognition and memory normal.        Judgment: Judgment normal.     Comments: Insight intact      Lab Review:     Component Value Date/Time   NA 139 01/17/2022 1041   NA 141 08/14/2019 0828   K 3.9 01/17/2022 1041   CL 104 01/17/2022 1041   CO2 29 01/17/2022 1041   GLUCOSE 88 01/17/2022 1041   BUN 12 01/17/2022 1041   BUN 13 08/14/2019 0828   CREATININE 0.83 01/17/2022 1041   CREATININE 0.77 12/28/2020 1406   CALCIUM 9.4 01/17/2022 1041   PROT 7.1 01/17/2022 1041   PROT 7.0 08/14/2019 0828   ALBUMIN 4.6 01/17/2022 1041   ALBUMIN 4.2 08/14/2019 0828   AST 18 01/17/2022 1041   ALT 6 01/17/2022 1041   ALKPHOS 55 01/17/2022 1041   BILITOT 0.8 01/17/2022 1041   BILITOT 0.6 08/14/2019 0828   GFRNONAA >60 01/19/2020 1233   GFRAA >60 01/19/2020 1233       Component Value Date/Time   WBC 4.7 01/17/2022 1041   RBC 4.54 01/17/2022 1041   HGB 14.2 01/17/2022 1041   HGB 14.6 08/14/2019 0828   HGB 13.2 04/02/2015 0905   HCT 41.9 01/17/2022 1041   HCT 43.7 08/14/2019 0828   PLT 205.0 01/17/2022 1041   PLT 257 08/14/2019 0828   MCV 92.3 01/17/2022 1041   MCV 94 08/14/2019 0828   MCH 30.8 12/28/2020 1406   MCHC 34.0 01/17/2022 1041   RDW 12.9 01/17/2022 1041   RDW 12.1 08/14/2019 0828   LYMPHSABS 1.5 01/17/2022 1041   MONOABS 0.3 01/17/2022 1041   EOSABS 0.1 01/17/2022 1041   BASOSABS 0.0 01/17/2022  1041    No results found for: "POCLITH", "LITHIUM"   No results found for: "PHENYTOIN", "PHENOBARB", "VALPROATE", "CBMZ"   01/17/22 labs including TSH, B12, CMP, CBC, D all normal  .res Assessment: Plan:    Sarah Welch was seen today for follow-up, depression and anxiety.  Diagnoses and all orders for this visit:  Major depressive disorder with single episode, in partial remission ([place])  Panic disorder without agoraphobia -     sertraline (ZOLOFT) 50 MG tablet; Take 1/2 tab po qd x 7 then 1 tab daily -     DULoxetine (CYMBALTA) 30 MG capsule; Take 1 capsule (30 mg total) by mouth 2 (two) times daily.  Generalized anxiety disorder -     sertraline (ZOLOFT) 50 MG tablet; Take 1/2 tab po qd x 7 then 1 tab daily -     DULoxetine (CYMBALTA) 30 MG capsule; Take 1 capsule (30 mg total) by mouth 2 (two) times daily.  Fatigue due to depression  Insomnia due to mental condition -     traZODone (DESYREL) 50  MG tablet; Take 1-2 tablets (50-100 mg total) by mouth at bedtime.  Low vitamin D level -     Vitamin D, Ergocalciferol, (DRISDOL) 1.25 MG (50000 UNIT) CAPS capsule; Take 1 capsule (50,000 Units total) by mouth every 7 (seven) days.  Major depressive disorder, recurrent episode, moderate ([facility]) -     DULoxetine (CYMBALTA) 30 MG capsule; Take 1 capsule (30 mg total) by mouth 2 (two) times daily.  Anxiety -     diazepam (VALIUM) 5 MG tablet; TAKE ONE TO ONE AND A HALF TABLETS BY MOUTH TWICE DAILY AS NEEDED  Panic attack as reaction to stress -     diazepam (VALIUM) 5 MG tablet; TAKE ONE TO ONE AND A HALF TABLETS BY MOUTH TWICE DAILY AS NEEDED   Greater than 50% of 30 min face to face time with patient was spent on counseling and coordination of care. We discussed Sarah Welch has the diagnoses noted above and was not well controlled on Wellbutrin and Lexapro.  Her symptoms of depression and anxiety are better with the switch to duloxetine 30 mg twice daily.   Also dx PD previously tends to have  some dizziness if she takes 60 mg at once.  Otherwise she is tolerating the current dosage.  She has had near resolution of depression and resolution of the irritability.  She still has some episodic minor anxiety which is overall improved.  She is functioning better and more active.  She is satisfied with the med response at this time.  Doubt bipolar but described sx and call if gets manic  She still takes diazepam on occasion but is not taking it as excessively.  And has not increased it and this is not the source of her fatigue.  Discussed options for fatigue which is better.  Recommend she restart vitamin D.  She asked for the once weekly supplement. Modafinil can be used off label for fatigue and people with chronic illnesses such as Parkinson's disease.  This may be helpful.  Discussed side effects in detail Modafinil 200 mg every morning prn  Needs better balance with work and stress and it should get better after estate.  Disc recent FMLA and no more is currently available to her.  Disc ways to balance this as much as possible.  Continue current duloxetine 30 BID, try moving both doses back tomorning. trazodone 50-100 mg if needed for sleep  Disc SE in detail and SSRI withdrawal sx.  If it fails call back  Add sertraline 25- then 50 mg daily for panic and anxiety It is uncommon to use an SSRI and an SNRI together.  However her depression has not responded to pure SSRIs in the past.  She has had some improvement in depression with the SNRI duloxetine but cannot tolerate a higher dose.  We want to avoid augmentation with partial dopamine agonist or any other antipsychotic due to Parkinson's disease.  Therefore the most efficient and effective way to reduce her anxiety which is her chief complaint would be the addition of a low-dose SSRI to be combined with the duloxetine.  There is a low risk of serotonin syndrome because the dose of the sertraline will be low  We discussed the short-term  risks associated with benzodiazepines including sedation and increased fall risk among others.  Discussed long-term side effect risk including dependence, potential withdrawal symptoms, and the potential eventual dose-related risk of dementia.  But recent studies from 2020 dispute this association between benzodiazepines and dementia risk. Newer studies  in 2020 do not support an association with dementia.  Follow-up 2 mos  Call if you have any additional symptoms or problems with the medication.  Lynder Parents MD, DFAPA  Please see After Visit Summary for patient specific instructions.  Future Appointments  Date Time Provider [place]  04/12/2022  1:30 PM Yisroel Ramming, Everardo All, MD [facility] None  08/18/2022  8:15 AM Tat, Eustace Quail, DO [facility] None    No orders of the defined types were placed in this encounter.    -------------------------------

## 2022-04-11 ENCOUNTER — Ambulatory Visit: Payer: Federal, State, Local not specified - PPO | Admitting: Nurse Practitioner

## 2022-04-11 ENCOUNTER — Encounter: Payer: Self-pay | Admitting: Nurse Practitioner

## 2022-04-11 VITALS — BP 122/82 | HR 83 | Temp 97.2°F | Wt 130.6 lb

## 2022-04-11 DIAGNOSIS — R350 Frequency of micturition: Secondary | ICD-10-CM

## 2022-04-11 DIAGNOSIS — N3000 Acute cystitis without hematuria: Secondary | ICD-10-CM | POA: Diagnosis not present

## 2022-04-11 LAB — POCT URINALYSIS DIPSTICK
Bilirubin, UA: POSITIVE
Blood, UA: NEGATIVE
Glucose, UA: NEGATIVE
Ketones, UA: POSITIVE
Nitrite, UA: NEGATIVE
Protein, UA: POSITIVE — AB
Spec Grav, UA: >=1.03 — AB (ref 1.010–1.025)
Urobilinogen, UA: 1 E.U./dL
pH, UA: 6 (ref 5.0–8.0)

## 2022-04-11 MED ORDER — NITROFURANTOIN MONOHYD MACRO 100 MG PO CAPS: 100.0000 mg | ORAL_CAPSULE | Freq: Two times a day (BID) | ORAL | 0 refills | Status: DC

## 2022-04-11 NOTE — Progress Notes (Unsigned)
51 y.o. G8P1011 Married Caucasian female here for annual exam.    PCP:  Robb Matar, NP   Patient's last menstrual period was 01/14/2016 (exact date).           Sexually active: {yes no:314532}  The current method of family planning is post menopausal status.    Exercising: {yes no:314532}  {types:19826} Smoker:  no  Health Maintenance: Pap:   03-30-18 Neg:Neg HR HPV, 04-02-15 Neg7-9-15 Neg:Neg HR HPV History of abnormal Pap:  yes, Hx of cryotherapy years ago. MMG: *** 12-05-20 Bil.saline implants stable/Neg/BiRads2 Colonoscopy:  1-22-21polyps;next 5 years BMD:   n/a  Result  n/a TDaP:  2018 Gardasil:   no HIV: 01-17-22 NR Hep C: 01-17-22 Neg Screening Labs:  Hb today: ***, Urine today: ***   reports that she quit smoking about 5 years ago. Her smoking use included cigarettes. She has a 12.50 pack-year smoking history. She has never used smokeless tobacco. She reports that she does not drink alcohol and does not use drugs.  Past Medical History:  Diagnosis Date   Abnormal Pap smear of cervix 1990   unsure abnormality, colpo OK--hx of cryotherapy to cervix   Parkinson disease ([place]) 12/15    Past Surgical History:  Procedure Laterality Date   AUGMENTATION MAMMAPLASTY Bilateral 2004   implants, saline   COLPOSCOPY     DILATION AND CURETTAGE OF UTERUS  1992   following SAB    Current Outpatient Medications  Medication Sig Dispense Refill   amantadine (SYMMETREL) 100 MG capsule Take 1 capsule (100 mg total) by mouth in the morning, at noon, and at bedtime. 270 capsule 1   benzonatate (TESSALON) 100 MG capsule Take 1 capsule (100 mg total) by mouth 2 (two) times daily as needed for cough. (Patient not taking: Reported on 03/15/2022) 20 capsule 0   carbidopa-levodopa (SINEMET CR) 50-200 MG tablet TAKE ONE TABLET BY MOUTH AT BEDTIME 90 tablet 1   carbidopa-levodopa (SINEMET IR) 25-100 MG tablet Take 2 tablets by mouth as directed. Can take up to 8 tablets a day. 720 tablet 0    diazepam (VALIUM) 5 MG tablet TAKE ONE TO ONE AND A HALF TABLETS BY MOUTH TWICE DAILY AS NEEDED 90 tablet 2   DULoxetine (CYMBALTA) 30 MG capsule Take 1 capsule (30 mg total) by mouth 2 (two) times daily. 180 capsule 0   estradiol (ESTRACE) 0.1 MG/GM vaginal cream Use 1/2 g vaginally every night for the first 2 weeks, then use 1/2 g vaginally two or three times per week as needed to maintain symptom relief. 42.5 g 2   fluticasone (FLONASE) 50 MCG/ACT nasal spray Place 2 sprays into both nostrils daily. 16 g 6   rOPINIRole (REQUIP) 0.25 MG tablet 1 po tid x 1 week, then 2 po tid x 1 week, then 3 po tid (Patient not taking: Reported on 04/07/2022) 73 tablet 0   rOPINIRole (REQUIP) 1 MG tablet Take 1 tablet (1 mg total) by mouth 3 (three) times daily. (Patient not taking: Reported on 04/07/2022) 270 tablet 1   sertraline (ZOLOFT) 50 MG tablet Take 1/2 tab po qd x 7 then 1 tab daily 30 tablet 1   traZODone (DESYREL) 50 MG tablet Take 1-2 tablets (50-100 mg total) by mouth at bedtime. 180 tablet 1   Vitamin D, Ergocalciferol, (DRISDOL) 1.25 MG (50000 UNIT) CAPS capsule Take 1 capsule (50,000 Units total) by mouth every 7 (seven) days. 15 capsule 3   No current facility-administered medications for this visit.  Family History  Problem Relation Age of Onset   Heart disease Mother    Alcohol abuse Father    Healthy Sister    Diabetes Maternal Grandmother    Hypertension Maternal Grandmother    Diabetes Maternal Grandfather    Hypertension Maternal Grandfather    Stroke Paternal Grandmother    Heart attack Paternal Grandfather    Healthy Daughter     Review of Systems  Exam:   LMP 01/14/2016 (Exact Date)     General appearance: alert, cooperative and appears stated age Head: normocephalic, without obvious abnormality, atraumatic Neck: no adenopathy, supple, symmetrical, trachea midline and thyroid normal to inspection and palpation Lungs: clear to auscultation bilaterally Breasts: normal  appearance, no masses or tenderness, No nipple retraction or dimpling, No nipple discharge or bleeding, No axillary adenopathy Heart: regular rate and rhythm Abdomen: soft, non-tender; no masses, no organomegaly Extremities: extremities normal, atraumatic, no cyanosis or edema Skin: skin color, texture, turgor normal. No rashes or lesions Lymph nodes: cervical, supraclavicular, and axillary nodes normal. Neurologic: grossly normal  Pelvic: External genitalia:  no lesions              No abnormal inguinal nodes palpated.              Urethra:  normal appearing urethra with no masses, tenderness or lesions              Bartholins and Skenes: normal                 Vagina: normal appearing vagina with normal color and discharge, no lesions              Cervix: no lesions              Pap taken: {yes no:314532} Bimanual Exam:  Uterus:  normal size, contour, position, consistency, mobility, non-tender              Adnexa: no mass, fullness, tenderness              Rectal exam: {yes no:314532}.  Confirms.              Anus:  normal sphincter tone, no lesions  Chaperone was present for exam:  ***  Assessment:   Well woman visit with gynecologic exam.   Plan: Mammogram screening discussed. Self breast awareness reviewed. Pap and HR HPV as above. Guidelines for Calcium, Vitamin D, regular exercise program including cardiovascular and weight bearing exercise.   Follow up annually and prn.   Additional counseling given.  {yes [id number]. _______ minutes face to face time of which over 50% was spent in counseling.    After visit summary provided.

## 2022-04-11 NOTE — Patient Instructions (Addendum)
It was great to see you!  Drink plenty of fluids. Start macrobid antibiotic twice a day. You can start azo over the counter as needed for the burning with urination.   Let's follow-up if your symptoms worsen or don't improve.   Take care,  Vance Peper, NP

## 2022-04-11 NOTE — Progress Notes (Signed)
Acute Office Visit  Subjective:     Patient ID: Sarah Welch, female    DOB: 1971-08-16, 51 y.o.   MRN: [id number]  Chief Complaint  Patient presents with   Urinary Tract Infection    Pt c/o urinary frequency, burning while urinating, no pain or bladder discomfort  x1 day     HPI Patient is in today for urinary frequency and dysuria for the past day.   URINARY SYMPTOMS  Dysuria: burning Urinary frequency: yes Urgency: yes Small volume voids: yes Urinary incontinence: no Foul odor: no Hematuria: no Abdominal pain: no Back pain: no Suprapubic pain/pressure: no Flank pain: no Fever:  no Vomiting: no Relief with cranberry juice:  n/a Relief with pyridium:  n/a Status: worse Previous urinary tract infection: yes Recurrent urinary tract infection: no Treatments attempted: none  ROS See pertinent positives and negatives per HPI.     Objective:    BP 122/82   Pulse 83   Temp (!) 97.2 F (36.2 C) (Temporal)   Wt 130 lb 9.6 oz (59.2 kg)   LMP 01/14/2016 (Exact Date)   SpO2 100%   BMI 22.42 kg/m  Wt Readings from Last 3 Encounters:  04/11/22 130 lb 9.6 oz (59.2 kg)  03/15/22 130 lb (59 kg)  02/15/22 134 lb 9.6 oz (61.1 kg)    Physical Exam Vitals and nursing note reviewed.  Constitutional:      General: She is not in acute distress.    Appearance: Normal appearance.  HENT:     Head: Normocephalic.  Eyes:     Conjunctiva/sclera: Conjunctivae normal.  Pulmonary:     Effort: Pulmonary effort is normal.  Abdominal:     Palpations: Abdomen is soft.     Tenderness: There is no abdominal tenderness. There is no right CVA tenderness or left CVA tenderness.  Musculoskeletal:     Cervical back: Normal range of motion.  Skin:    General: Skin is warm.  Neurological:     General: No focal deficit present.     Mental Status: She is alert and oriented to person, place, and time.  Psychiatric:        Mood and Affect: Mood normal.        Behavior:  Behavior normal.        Thought Content: Thought content normal.        Judgment: Judgment normal.     Results for orders placed or performed in visit on 04/11/22  POCT urinalysis dipstick  Result Value Ref Range   Color, UA Dark Yellow    Clarity, UA cloudy    Glucose, UA Negative Negative   Bilirubin, UA pos    Ketones, UA pos    Spec Grav, UA >=1.030 (A) 1.010 - 1.025   Blood, UA neg    pH, UA 6.0 5.0 - 8.0   Protein, UA Positive (A) Negative   Urobilinogen, UA 1.0 0.2 or 1.0 E.U./dL   Nitrite, UA neg    Leukocytes, UA Moderate (2+) (A) Negative   Appearance     Odor        Assessment & Plan:   Problem List Items Addressed This Visit   None Visit Diagnoses     Acute cystitis without hematuria    -  Primary   Will treat with macrobid BID x5 days. Encourage fluids, can take otc azo as needed for burning. Urine sent for culture. F/U if not improving.    Relevant Orders   Urine Culture  Urinary frequency       U/A positive for leukocytes 2+ and protein. Will treat for UTI.   Relevant Orders   POCT urinalysis dipstick (Completed)       Meds ordered this encounter  Medications   nitrofurantoin, macrocrystal-monohydrate, (MACROBID) 100 MG capsule    Sig: Take 1 capsule (100 mg total) by mouth 2 (two) times daily.    Dispense:  10 capsule    Refill:  0    Return if symptoms worsen or fail to improve.  Charyl Dancer, NP

## 2022-04-12 ENCOUNTER — Ambulatory Visit (INDEPENDENT_AMBULATORY_CARE_PROVIDER_SITE_OTHER): Payer: Federal, State, Local not specified - PPO | Admitting: Obstetrics and Gynecology

## 2022-04-12 ENCOUNTER — Other Ambulatory Visit: Payer: Self-pay

## 2022-04-12 ENCOUNTER — Encounter: Payer: Self-pay | Admitting: Obstetrics and Gynecology

## 2022-04-12 VITALS — BP 102/64 | HR 86 | Ht 64.0 in | Wt 129.0 lb

## 2022-04-12 DIAGNOSIS — R1031 Right lower quadrant pain: Secondary | ICD-10-CM

## 2022-04-12 DIAGNOSIS — R1032 Left lower quadrant pain: Secondary | ICD-10-CM

## 2022-04-12 DIAGNOSIS — Z01419 Encounter for gynecological examination (general) (routine) without abnormal findings: Secondary | ICD-10-CM | POA: Diagnosis not present

## 2022-04-12 LAB — URINE CULTURE
MICRO NUMBER:: 13685598
Result:: NO GROWTH
SPECIMEN QUALITY:: ADEQUATE

## 2022-04-12 MED ORDER — ESTRADIOL 0.1 MG/GM VA CREA
TOPICAL_CREAM | VAGINAL | 2 refills | Status: DC
Start: 2022-04-12 — End: 2023-10-11

## 2022-04-12 NOTE — Patient Instructions (Signed)

## 2022-04-13 ENCOUNTER — Ambulatory Visit: Payer: Federal, State, Local not specified - PPO | Admitting: Neurology

## 2022-04-18 ENCOUNTER — Other Ambulatory Visit: Payer: Self-pay | Admitting: Neurology

## 2022-04-22 ENCOUNTER — Other Ambulatory Visit: Payer: Self-pay | Admitting: Neurology

## 2022-04-22 ENCOUNTER — Encounter: Payer: Self-pay | Admitting: Obstetrics and Gynecology

## 2022-04-22 DIAGNOSIS — G20 Parkinson's disease: Secondary | ICD-10-CM

## 2022-05-03 ENCOUNTER — Ambulatory Visit: Payer: Federal, State, Local not specified - PPO | Admitting: Psychiatry

## 2022-05-08 ENCOUNTER — Other Ambulatory Visit: Payer: Self-pay | Admitting: Neurology

## 2022-05-19 ENCOUNTER — Ambulatory Visit (INDEPENDENT_AMBULATORY_CARE_PROVIDER_SITE_OTHER): Payer: Federal, State, Local not specified - PPO

## 2022-05-19 ENCOUNTER — Encounter: Payer: Self-pay | Admitting: Obstetrics and Gynecology

## 2022-05-19 ENCOUNTER — Ambulatory Visit (INDEPENDENT_AMBULATORY_CARE_PROVIDER_SITE_OTHER): Payer: Federal, State, Local not specified - PPO | Admitting: Obstetrics and Gynecology

## 2022-05-19 VITALS — BP 112/76 | HR 88 | Ht 64.0 in | Wt 129.0 lb

## 2022-05-19 DIAGNOSIS — R1031 Right lower quadrant pain: Secondary | ICD-10-CM | POA: Diagnosis not present

## 2022-05-19 DIAGNOSIS — K5900 Constipation, unspecified: Secondary | ICD-10-CM

## 2022-05-19 DIAGNOSIS — D219 Benign neoplasm of connective and other soft tissue, unspecified: Secondary | ICD-10-CM

## 2022-05-19 NOTE — Progress Notes (Signed)
GYNECOLOGY  VISIT   HPI: 51 y.o.   Married  Caucasian  female   G2P1011 with Patient's last menstrual period was 01/14/2016 (exact date).   here for pelvic ultrasound for LLQ discomfort, firmness in left adnexa on pelvic exam and unintentional weight loss.   LLQ pain has happened 1 - 2 times since her visit on 04/12/22.  Taking medication for parkinson's and does have constipation.   Colonoscopy in 2021, polyps noted.  Due for next study in 2026.   She has had unintentional weight loss.  Weight loss has reached a plateau at 30 pounds.  No current explanation for this other than she is not doing stress eating.  She has discussed with her PCP and neurology.  GYNECOLOGIC HISTORY: Patient's last menstrual period was 01/14/2016 (exact date). Contraception:  PMP Menopausal hormone therapy: Estrace Vaginal cream Last mammogram:  12-18-21 Normal per patient--[facility] Last pap smear:   03-30-18 Neg:Neg HR HPV, 04-02-15 Neg7-9-15 Neg:Neg HR HPV        OB History     Gravida  2   Para  1   Term  1   Preterm      AB  1   Living  1      SAB  1   IAB      Ectopic      Multiple      Live Births  1              Patient Active Problem List   Diagnosis Date Noted   Colon cancer screening 02/01/2022   Drug-induced constipation 02/01/2022   Unintentional weight loss 01/17/2022   Acute right-sided low back pain without sciatica 09/22/2016   Allergy to pollen 04/07/2016   Headache 04/07/2016   Restless leg 12/23/2014   Anxiety 12/10/2014   Parkinson's disease ([place]) 08/19/2014   Cervical radiculopathy 07/29/2014   Leg cramps 07/29/2014   Hemiparesis ([place]) 07/01/2014   Abnormal involuntary movement 07/01/2014   Tremor of left hand 07/01/2014   Dysfunctional uterine bleeding 06/08/2014    Past Medical History:  Diagnosis Date   Abnormal Pap smear of cervix 1990   unsure abnormality, colpo OK--hx of cryotherapy to cervix   Parkinson disease ([place]) 12/15    Past Surgical  History:  Procedure Laterality Date   AUGMENTATION MAMMAPLASTY Bilateral 2004   implants, saline   COLPOSCOPY     DILATION AND CURETTAGE OF UTERUS  1992   following SAB    Current Outpatient Medications  Medication Sig Dispense Refill   amantadine (SYMMETREL) 100 MG capsule TAKE ONE CAPSULE BY MOUTH EVERY MORNING, AT NOON, AND AT BEDTIME 270 capsule 0   carbidopa-levodopa (SINEMET CR) 50-200 MG tablet TAKE ONE TABLET BY MOUTH AT BEDTIME 90 tablet 0   carbidopa-levodopa (SINEMET IR) 25-100 MG tablet TAKE 2 TABLETS BY MOUTH DAILY AS DIRECTED. MAY TAKE UP TO 8 TABLETS PER DAY. 720 tablet 0   diazepam (VALIUM) 5 MG tablet TAKE ONE TO ONE AND A HALF TABLETS BY MOUTH TWICE DAILY AS NEEDED 90 tablet 2   DULoxetine (CYMBALTA) 30 MG capsule Take 1 capsule (30 mg total) by mouth 2 (two) times daily. 180 capsule 0   estradiol (ESTRACE) 0.1 MG/GM vaginal cream Use 1/2 g vaginally two or three times per week as needed to maintain symptom relief. 42.5 g 2   sertraline (ZOLOFT) 50 MG tablet Take 1/2 tab po qd x 7 then 1 tab daily 30 tablet 1   traZODone (DESYREL) 50 MG tablet  Take 1-2 tablets (50-100 mg total) by mouth at bedtime. 180 tablet 1   Vitamin D, Ergocalciferol, (DRISDOL) 1.25 MG (50000 UNIT) CAPS capsule Take 1 capsule (50,000 Units total) by mouth every 7 (seven) days. 15 capsule 3   No current facility-administered medications for this visit.     ALLERGIES: Ciprofloxacin, Pramipexole, and Oxycodone-acetaminophen  Family History  Problem Relation Age of Onset   Heart disease Mother    Alcohol abuse Father    Healthy Sister    Diabetes Maternal Grandmother    Hypertension Maternal Grandmother    Diabetes Maternal Grandfather    Hypertension Maternal Grandfather    Stroke Paternal Grandmother    Heart attack Paternal Grandfather    Healthy Daughter     Social History   Socioeconomic History   Marital status: Married    Spouse name: Not on file   Number of children: 1   Years  of education: Not on file   Highest education level: Associate degree: academic program  Occupational History   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  Tobacco Use   Smoking status: Former    Packs/day: 0.50    Years: 25.00    Total pack years: 12.50    Types: Cigarettes    Quit date: 07/04/2016    Years since quitting: 5.8   Smokeless tobacco: Never  Vaping Use   Vaping Use: Never used  Substance and Sexual Activity   Alcohol use: No    Alcohol/week: 0.0 standard drinks of alcohol   Drug use: No   Sexual activity: Yes    Partners: Male    Birth control/protection: Post-menopausal    Comment: first intercourse >15, less than 5 partners  Other Topics Concern   Not on file  Social History Narrative   Right handed   Lives with husband on a 2 level home   Caffeine 1 cup day   Social Determinants of Health   Financial Resource Strain: Not on file  Food Insecurity: Not on file  Transportation Needs: Not on file  Physical Activity: Not on file  Stress: Not on file  Social Connections: Not on file  Intimate Partner Violence: Not on file    Review of Systems  All other systems reviewed and are negative.   PHYSICAL EXAMINATION:    BP 112/76   Pulse 88   Ht '5\' 4"'$  (1.626 m)   Wt 129 lb (58.5 kg)   LMP 01/14/2016 (Exact Date)   SpO2 98%   BMI 22.14 kg/m     General appearance: alert, cooperative and appears stated age   Pelvic US Uterus 5.10 x 3.89 x 2.03 cm.  2 small intramural fibroids:  0.36 and 0.95 cm. EMS 1.68 mm.   Left ovary 1.63 x 1.02 x 0.99 cm.  Atrophic.  Positive perfusion.  Right ovary 2.17 x 0.99 x 1.28 cm.   Atrophic.   Positive perfusion.  Moderate colon stool burden noted near left adnexa.   Colon wall more prominent.   ASSESSMENT  Fibroids.  LLQ pain.  Constipation.   PLAN  Pelvic US images and report reviewed.  Fibroids discussed.  Reassurance regarding reproductive anatomy.  We  discussed Miralax or Metamucil, increased hydration, and increased fiber to treat constipation.  I recommend she see her PCP if weight loss persists or LLQ pain becomes persistent of more prominent.  Fu for annual exam and prn.    An After Visit Summary  was printed and given to the patient.  27 min  total time was spent for this patient encounter, including preparation, face-to-face counseling with the patient, coordination of care, and documentation of the encounter.

## 2022-05-26 ENCOUNTER — Encounter: Payer: Self-pay | Admitting: Family Medicine

## 2022-05-26 ENCOUNTER — Ambulatory Visit: Payer: Federal, State, Local not specified - PPO | Admitting: Family Medicine

## 2022-05-26 VITALS — BP 118/82 | HR 77 | Temp 97.9°F | Wt 129.0 lb

## 2022-05-26 DIAGNOSIS — B029 Zoster without complications: Secondary | ICD-10-CM | POA: Insufficient documentation

## 2022-05-26 DIAGNOSIS — R6883 Chills (without fever): Secondary | ICD-10-CM | POA: Diagnosis not present

## 2022-05-26 LAB — POC COVID19 BINAXNOW: SARS Coronavirus 2 Ag: NEGATIVE

## 2022-05-26 MED ORDER — VALACYCLOVIR HCL 1 G PO TABS: 1000.0000 mg | ORAL_TABLET | Freq: Three times a day (TID) | ORAL | 0 refills | Status: AC

## 2022-05-26 NOTE — Patient Instructions (Signed)
After outbreak clears, get shingles vaccine to prevent future outbreaks

## 2022-05-26 NOTE — Progress Notes (Signed)
Assessment/Plan:   Problem List Items Addressed This Visit       Other   Herpes zoster without complication    No active lesion, likely in prodrome/eruptive phase No ophthalmologic or otic symptoms To prevent spread, advised patient to remain closed, and avoid pregnant and immunocompromised people Advised patient to get Shingrix after resolution of symptoms prevent future outbreaks Start Valtrex 1000 mg 3 times daily for 7 days Follow-up as needed      Relevant Medications   valACYclovir (VALTREX) 1000 MG tablet   Other Visit Diagnoses     Chills (without fever)    -  Primary   Relevant Orders   POC COVID-19 (Completed)          Subjective:  HPI:  Sarah Welch is a 51 y.o. female who has Dysfunctional uterine bleeding; Cervical radiculopathy; Hemiparesis ([place]); Parkinson's disease ([place]); Abnormal involuntary movement; Acute right-sided low back pain without sciatica; Allergy to pollen; Anxiety; Headache; Leg cramps; Restless leg; Tremor of left hand; Unintentional weight loss; Colon cancer screening; Drug-induced constipation; and Herpes zoster without complication on their problem list..   She  has a past medical history of Abnormal Pap smear of cervix (1990) and Parkinson disease ([place]) (12/15)..   She presents with chief complaint of Shingles (Across left chest. Experienced chills with no fever. Stomach discomfort. ) .  Patient presents with 1 day history of chills and left chest and neck pain.  Describes the pain as numbness and neuropathic like in nature.  Patient states that pain does not cross midline.  Patient said that she felt "flulike" and had some mild abdominal discomfort.  She took her temperature at home and said that she never developed a fever.  He is taking ibuprofen for chills.  Tolerating p.o. well.  Of note, patient had shingles about 1 year ago.  She says that this feels very similar.  The outbreak was on the right lower abdomen.  She said  symptoms were mild.  Patient successfully had resolution after completing course of Valtrex after prior outbreak.  Patient has not had shingles vaccine.  Denies any issues with vision, hearing, eye pain, ear pain or face pain.   Past Surgical History:  Procedure Laterality Date   AUGMENTATION MAMMAPLASTY Bilateral 2004   implants, saline   COLPOSCOPY     DILATION AND CURETTAGE OF UTERUS  1992   following SAB    Outpatient Medications Prior to Visit  Medication Sig Dispense Refill   amantadine (SYMMETREL) 100 MG capsule TAKE ONE CAPSULE BY MOUTH EVERY MORNING, AT NOON, AND AT BEDTIME 270 capsule 0   carbidopa-levodopa (SINEMET CR) 50-200 MG tablet TAKE ONE TABLET BY MOUTH AT BEDTIME 90 tablet 0   carbidopa-levodopa (SINEMET IR) 25-100 MG tablet TAKE 2 TABLETS BY MOUTH DAILY AS DIRECTED. MAY TAKE UP TO 8 TABLETS PER DAY. 720 tablet 0   diazepam (VALIUM) 5 MG tablet TAKE ONE TO ONE AND A HALF TABLETS BY MOUTH TWICE DAILY AS NEEDED 90 tablet 2   DULoxetine (CYMBALTA) 30 MG capsule Take 1 capsule (30 mg total) by mouth 2 (two) times daily. 180 capsule 0   estradiol (ESTRACE) 0.1 MG/GM vaginal cream Use 1/2 g vaginally two or three times per week as needed to maintain symptom relief. 42.5 g 2   sertraline (ZOLOFT) 50 MG tablet Take 1/2 tab po qd x 7 then 1 tab daily 30 tablet 1   traZODone (DESYREL) 50 MG tablet Take 1-2 tablets (50-100 mg total) by  mouth at bedtime. 180 tablet 1   Vitamin D, Ergocalciferol, (DRISDOL) 1.25 MG (50000 UNIT) CAPS capsule Take 1 capsule (50,000 Units total) by mouth every 7 (seven) days. 15 capsule 3   No facility-administered medications prior to visit.    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[employer]    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  Tobacco Use   Smoking status: Former    Packs/day: 0.50    Years: 25.00    Total pack years: 12.50    Types: Cigarettes    Quit date: 07/04/2016    Years since quitting: 5.8   Smokeless tobacco: Never  Vaping Use   Vaping Use: Never used  Substance and Sexual Activity   Alcohol use: No    Alcohol/week: 0.0 standard drinks of alcohol   Drug use: No   Sexual activity: Yes    Partners: Male    Birth control/protection: Post-menopausal    Comment: first intercourse >15, less than 5 partners  Other Topics Concern   Not on file  Social History Narrative   Right handed   Lives with husband on a 2 level home   Caffeine 1 cup day   Social Determinants of Health   Financial Resource Strain: Not on file  Food Insecurity: Not on file  Transportation Needs: Not on file  Physical Activity: Not on file  Stress: Not on file  Social Connections: Not on file  Intimate Partner Violence: Not on file                                                                                                 Objective:  Physical Exam: BP 118/82 (BP Location: Left Arm, Patient Position: Sitting, Cuff Size: Large)   Pulse 77   Temp 97.9 F (36.6 C) (Temporal)   Wt 129 lb (58.5 kg)   LMP 01/14/2016 (Exact Date)   SpO2 98%   BMI 22.14 kg/m   Chaperoned by Ane [employer].  General: No acute distress. Awake and conversant.  Eyes: Normal conjunctiva, anicteric. Round symmetric pupils.  ENT: Hearing grossly intact. No nasal discharge.  Neck: Neck is supple. No masses or thyromegaly.  Respiratory: Respirations are  non-labored. No auditory wheezing.  Skin: Left chest and neck without rash or ulcer, patient says pain does not cross midline Psych: Alert and oriented. Cooperative, Appropriate mood and affect, Normal judgment.  CV: No cyanosis or JVD MSK:  Normal ambulation. No clubbing  Neuro: Sensation and CN II-XII grossly normal.   Point-of-care COVID test negative     Alesia Banda, MD, MS

## 2022-05-26 NOTE — Assessment & Plan Note (Signed)
No active lesion, likely in prodrome/eruptive phase No ophthalmologic or otic symptoms To prevent spread, advised patient to remain closed, and avoid pregnant and immunocompromised people Advised patient to get Shingrix after resolution of symptoms prevent future outbreaks Start Valtrex 1000 mg 3 times daily for 7 days Follow-up as needed

## 2022-06-06 ENCOUNTER — Other Ambulatory Visit: Payer: Self-pay | Admitting: Psychiatry

## 2022-06-06 DIAGNOSIS — F411 Generalized anxiety disorder: Secondary | ICD-10-CM

## 2022-06-06 DIAGNOSIS — F410 Panic disorder [episodic paroxysmal anxiety] without agoraphobia: Secondary | ICD-10-CM

## 2022-06-22 ENCOUNTER — Encounter: Payer: Self-pay | Admitting: Psychiatry

## 2022-06-22 ENCOUNTER — Ambulatory Visit: Payer: Federal, State, Local not specified - PPO | Admitting: Psychiatry

## 2022-06-22 DIAGNOSIS — F410 Panic disorder [episodic paroxysmal anxiety] without agoraphobia: Secondary | ICD-10-CM

## 2022-06-22 DIAGNOSIS — F32A Depression, unspecified: Secondary | ICD-10-CM

## 2022-06-22 DIAGNOSIS — R5383 Other fatigue: Secondary | ICD-10-CM

## 2022-06-22 DIAGNOSIS — F324 Major depressive disorder, single episode, in partial remission: Secondary | ICD-10-CM

## 2022-06-22 DIAGNOSIS — R7989 Other specified abnormal findings of blood chemistry: Secondary | ICD-10-CM

## 2022-06-22 DIAGNOSIS — F430 Acute stress reaction: Secondary | ICD-10-CM

## 2022-06-22 DIAGNOSIS — F411 Generalized anxiety disorder: Secondary | ICD-10-CM

## 2022-06-22 DIAGNOSIS — F5105 Insomnia due to other mental disorder: Secondary | ICD-10-CM

## 2022-06-22 DIAGNOSIS — F419 Anxiety disorder, unspecified: Secondary | ICD-10-CM

## 2022-06-22 DIAGNOSIS — F331 Major depressive disorder, recurrent, moderate: Secondary | ICD-10-CM

## 2022-06-22 MED ORDER — DULOXETINE HCL 30 MG PO CPEP: 30.0000 mg | ORAL_CAPSULE | Freq: Two times a day (BID) | ORAL | 0 refills | Status: DC

## 2022-06-22 MED ORDER — DIAZEPAM 5 MG PO TABS: ORAL_TABLET | ORAL | 2 refills | Status: DC

## 2022-06-22 MED ORDER — SERTRALINE HCL 50 MG PO TABS: 50.0000 mg | ORAL_TABLET | Freq: Every day | ORAL | 1 refills | Status: DC

## 2022-06-22 MED ORDER — VITAMIN D (ERGOCALCIFEROL) 1.25 MG (50000 UNIT) PO CAPS: 50000.0000 [IU] | ORAL_CAPSULE | ORAL | 3 refills | Status: DC

## 2022-06-22 MED ORDER — TRAZODONE HCL 50 MG PO TABS: 50.0000 mg | ORAL_TABLET | Freq: Every day | ORAL | 1 refills | Status: DC

## 2022-06-22 NOTE — Progress Notes (Signed)
Sarah Welch [id number] [birth year] 51 y.o.  Subjective:   [name] is a 51 y.o. (DOB [birth year]) female.  Chief Complaint:  Chief Complaint  Patient presents with   Follow-up   Anxiety   Depression    HPI Sarah Welch presents to the office today for follow-up of depression and anxiety.  01/2020 appt with the following noted and no med changes: Going good. Better motivated and she feelks better.  Occ anxiety spontaneous with diazepam helpful.  Overall anxiety level of anxiety is better and less depression.  Most improvement in the last 3 weeks with better activity.  Irritability resolved. Tolerating duloxetine prefers AM and noon.    05/18/2020 appt wit the following noted: Job still stressful but meds are fine.  Short staffed.  Stress eating.  Work out 3 times weekly and plans more.  Fears vaccine mandate will cause more employees to leave.   Diazepam once or twice weekly.  No panic.  11/16/2020 appointment with following noted: Still on duloxetine 30 BID and rare diazepam.  PD meds without change. Sleep problems for a couple of months.  EMA and initial insomnia. Caffeine before lunch 2 cups.  Some nights only 2 hours sleep. Depression might be a little worse, but tired of not sleeping.  Anxiety is OK.  Work is better.  Average 3 hours sleep per night. Plan: start trazodone 50 and continue duloxetine 30 BID  01/12/21 appt noted:  Trazodone partly helped.  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  Don't know where it's coming from.  Not like that in years.  H notices but not complaining.  Harder to get to sleep those days.  It's gone in the morning.  Going on for 3 weeks. No history of mania.  Plan: Continue current duloxetine 30 BID, try moving both doses back tomorning. trazodone can increase to 100 mg if needed for sleep  02/05/2021 urgent appointment scheduled at patient  request: Day after seen.  M died in her sleep.  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  Old dog is dying.  Straw broke camel's back was job stressor.  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  Sleep is OK.  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  Has told people she can't do it.    04/29/21 Average diazepam about 4 days per week. Continues duloxetine 30 BID, Trazodone 50-100 HS. No SE. Still dealing with estate things and work stress.  Got off 6 weeks from work but not STD.   Function at work seems OK.  Still gets anxiety attacks with SOB at work about once daily but fights through it.  Still short staffed at work. Sleep 10-530 usually. Not able to find much enjoyment usually.   Feels she is overextended and that makes it hard to relax into after hour activites.  Hope when estate is resolved things will get better. Plan: Continue current duloxetine 30 BID, try moving both doses back tomorning. trazodone can increase to 100 mg if needed for sleep  07/28/21 appt noted: Sleep all the time if can now.  So tired for a couple of weeks. Taking diazepam more rarely.  Some weeks none. No med changes. NO change PD meds. Don't know if depressed.  No reason.  Too tired to enjoy things.  Not a lot  of nervousness. 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  Energy better but not normal.   Patient reports stable mood and denies depressed or irritable moods.  Patient denies any recent difficulty with anxiety.  Patient denies difficulty with sleep initiation or maintenance.  Patient reports that energy and motivation have been good.  Patient denies any difficulty with concentration.  Patient denies any suicidal ideation. Modafinil 200 noticeable but hasn't needed it in awhile. Can get overwhelmed with workload but otherwise anxiety  ok Lost weight to 143# without trying.  Highest ever 160# Plan: no med changes  04/07/22 appt noted: Fine until a month ago anxiety attacks out of nowhere.  At [facility], started sobbing without reason or trigger other than being hot. Drove home.  Then got in a rage with trigger going home.  No accidents on incidents.  That was the worst but other episodes anxiety with hands tingling. No other rage episodes except quick temper as a kid.   OK the next day other than tired. Lost wt without trying.  Current wt about 130#.   Uses pill box and doesn't think she's missing any meds. Not generally more anxious usually.  Nor irrtiable. Job is still real stressful and PD worse in afternoon. Dep 6-7/10 Saturdays wants to shop usually but now getting anxious about it. Sx worse in last 5 weeks.  No trigger. Fri-Sunday EMA and crashed Monday.  EMA in the last week. Plan: Continue current duloxetine 30 BID, try moving both doses back tomorning. trazodone 50-100 mg if needed for sleep  Disc SE in detail and SSRI withdrawal sx.  If it fails call back Add sertraline 25- then 50 mg daily for panic and anxiety  06/22/22 appt noted: It's helped with less anxiety and 1-2 panic since here.  Tired all the time. Wt loss stopped 128#. Taking B complex and D.   Added Ropinirole BID for PD. Consistent with sertraline 50 for panic and duloxetine 60 mg daily.  Occ diazepam. Depression ok.  Dong alright.    Exercises with PD group and feels good physically. No family history of bipolar.  Past Psychiatric History:  No psychiatrist or counselor. Past Psychiatric Medication Trials: Celexa, Lexapro, Wellbutrin SE, duloxetine 30 BID Diazepam Remote Ambien  Review of Systems:  Review of Systems  Cardiovascular: Negative for chest pain and palpitations.  Neurological: Positive for tremors. Negative for dizziness and weakness.  Ongoing fatigue  Medications: I have reviewed the patient's current  medications.  Current Outpatient Medications  Medication Sig Dispense Refill   amantadine (SYMMETREL) 100 MG capsule TAKE ONE CAPSULE BY MOUTH EVERY MORNING, AT NOON, AND AT BEDTIME 270 capsule 0   carbidopa-levodopa (SINEMET CR) 50-200 MG tablet TAKE ONE TABLET BY MOUTH AT BEDTIME 90 tablet 0   carbidopa-levodopa (SINEMET IR) 25-100 MG tablet TAKE 2 TABLETS BY MOUTH DAILY AS DIRECTED. MAY TAKE UP TO 8 TABLETS PER DAY. 720 tablet 0   estradiol (ESTRACE) 0.1 MG/GM vaginal cream Use 1/2 g vaginally two or three times per week as needed to maintain symptom relief. 42.5 g 2   diazepam (VALIUM) 5 MG tablet TAKE ONE TO ONE AND A HALF TABLETS BY MOUTH TWICE DAILY AS NEEDED 90 tablet 2   DULoxetine (CYMBALTA) 30 MG capsule Take 1 capsule (30 mg total) by mouth 2 (two) times daily. 180 capsule 0   sertraline (ZOLOFT) 50  MG tablet Take 1 tablet (50 mg total) by mouth daily. 180 tablet 1   traZODone (DESYREL) 50 MG tablet Take 1-2 tablets (50-100 mg total) by mouth at bedtime. 180 tablet 1   Vitamin D, Ergocalciferol, (DRISDOL) 1.25 MG (50000 UNIT) CAPS capsule Take 1 capsule (50,000 Units total) by mouth every 7 (seven) days. 15 capsule 3   No current facility-administered medications for this visit.    Medication Side Effects: None  Allergies:  Allergies  Allergen Reactions   Ciprofloxacin Other (See Comments)    SEVERE JOINT PAIN Other reaction(s): Unknown   Pramipexole Other (See Comments)    Could not get up for 24 hours   Oxycodone-Acetaminophen     Other reaction(s): Unknown    Past Medical History:  Diagnosis Date   Abnormal Pap smear of cervix 1990   unsure abnormality, colpo OK--hx of cryotherapy to cervix   Parkinson disease 12/15    Family History  Problem Relation Age of Onset   Heart disease Mother    Alcohol abuse Father    Healthy Sister    Diabetes Maternal Grandmother    Hypertension Maternal Grandmother    Diabetes Maternal Grandfather    Hypertension Maternal  Grandfather    Stroke Paternal Grandmother    Heart attack Paternal Grandfather    Healthy Daughter     Social History   Socioeconomic History   Marital status: Married    Spouse name: Not on file   Number of children: 1   Years of education: Not on file   Highest education level: Associate degree: academic program  Occupational History   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[employer]    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  Tobacco Use   Smoking status: Former    Packs/day: 0.50    Years: 25.00    Total pack years: 12.50    Types: Cigarettes    Quit date: 07/04/2016    Years since quitting: 5.9   Smokeless tobacco: Never  Vaping Use   Vaping Use: Never used  Substance and Sexual Activity   Alcohol use: No    Alcohol/week: 0.0 standard drinks of alcohol   Drug use: No   Sexual activity: Yes    Partners: Male    Birth control/protection: Post-menopausal    Comment: first intercourse >15, less than 5 partners  Other Topics Concern   Not on file  Social History Narrative   Right handed   Lives with husband on a 2 level home   Caffeine 1 cup day   Social Determinants of Health   Financial Resource Strain: Not on file  Food Insecurity: Not on file  Transportation Needs: Not on file  Physical Activity: Not on file  Stress: Not on file  Social Connections: Not on file  Intimate Partner Violence: Not on file    Past Medical History, Surgical history, Social history, and Family history were reviewed and updated as appropriate.   Please see review of systems for further details on the patient's review from today.   Objective:   Physical Exam:  LMP 01/14/2016 (Exact Date)   Physical Exam Constitutional:      General: She is not in acute distress. Musculoskeletal:        General: No deformity.  Neurological:     Mental Status: She is alert and oriented to person, place, and time.     Motor: No weakness.     Coordination: Coordination normal.   Psychiatric:  Attention and Perception: Attention and perception normal. She does not perceive auditory or visual hallucinations.        Mood and Affect: Mood is less depressed. Mood is less anxious. Affect is not labile, blunt, angry, tearful or inappropriate.        Speech: Speech normal.        Behavior: Behavior normal.        Thought Content: Thought content normal. Thought content is not paranoid or delusional. Thought content does not include homicidal or suicidal ideation. Thought content does not include homicidal or suicidal plan.        Cognition and Memory: Cognition and memory normal.        Judgment: Judgment normal.     Comments: Insight intact      Lab Review:     Component Value Date/Time   NA 139 01/17/2022 1041   NA 141 08/14/2019 0828   K 3.9 01/17/2022 1041   CL 104 01/17/2022 1041   CO2 29 01/17/2022 1041   GLUCOSE 88 01/17/2022 1041   BUN 12 01/17/2022 1041   BUN 13 08/14/2019 0828   CREATININE 0.83 01/17/2022 1041   CREATININE 0.77 12/28/2020 1406   CALCIUM 9.4 01/17/2022 1041   PROT 7.1 01/17/2022 1041   PROT 7.0 08/14/2019 0828   ALBUMIN 4.6 01/17/2022 1041   ALBUMIN 4.2 08/14/2019 0828   AST 18 01/17/2022 1041   ALT 6 01/17/2022 1041   ALKPHOS 55 01/17/2022 1041   BILITOT 0.8 01/17/2022 1041   BILITOT 0.6 08/14/2019 0828   GFRNONAA >60 01/19/2020 1233   GFRAA >60 01/19/2020 1233       Component Value Date/Time   WBC 4.7 01/17/2022 1041   RBC 4.54 01/17/2022 1041   HGB 14.2 01/17/2022 1041   HGB 14.6 08/14/2019 0828   HGB 13.2 04/02/2015 0905   HCT 41.9 01/17/2022 1041   HCT 43.7 08/14/2019 0828   PLT 205.0 01/17/2022 1041   PLT 257 08/14/2019 0828   MCV 92.3 01/17/2022 1041   MCV 94 08/14/2019 0828   MCH 30.8 12/28/2020 1406   MCHC 34.0 01/17/2022 1041   RDW 12.9 01/17/2022 1041   RDW 12.1 08/14/2019 0828   LYMPHSABS 1.5 01/17/2022 1041   MONOABS 0.3 01/17/2022 1041   EOSABS 0.1 01/17/2022 1041   BASOSABS 0.0 01/17/2022  1041    No results found for: "POCLITH", "LITHIUM"   No results found for: "PHENYTOIN", "PHENOBARB", "VALPROATE", "CBMZ"   01/17/22 labs including TSH, B12, CMP, CBC, D all normal  .res Assessment: Plan:    Sarah Welch was seen today for follow-up, anxiety and depression.  Diagnoses and all orders for this visit:  Major depressive disorder with single episode, in partial remission ([place])  Panic disorder without agoraphobia -     DULoxetine (CYMBALTA) 30 MG capsule; Take 1 capsule (30 mg total) by mouth 2 (two) times daily. -     sertraline (ZOLOFT) 50 MG tablet; Take 1 tablet (50 mg total) by mouth daily.  Generalized anxiety disorder -     DULoxetine (CYMBALTA) 30 MG capsule; Take 1 capsule (30 mg total) by mouth 2 (two) times daily. -     sertraline (ZOLOFT) 50 MG tablet; Take 1 tablet (50 mg total) by mouth daily.  Fatigue due to depression  Insomnia due to mental condition -     traZODone (DESYREL) 50 MG tablet; Take 1-2 tablets (50-100 mg total) by mouth at bedtime.  Low vitamin D level -     Vitamin D,  Ergocalciferol, (DRISDOL) 1.25 MG (50000 UNIT) CAPS capsule; Take 1 capsule (50,000 Units total) by mouth every 7 (seven) days.  Major depressive disorder, recurrent episode, moderate (HCC) -     DULoxetine (CYMBALTA) 30 MG capsule; Take 1 capsule (30 mg total) by mouth 2 (two) times daily.  Anxiety -     diazepam (VALIUM) 5 MG tablet; TAKE ONE TO ONE AND A HALF TABLETS BY MOUTH TWICE DAILY AS NEEDED  Panic attack as reaction to stress -     diazepam (VALIUM) 5 MG tablet; TAKE ONE TO ONE AND A HALF TABLETS BY MOUTH TWICE DAILY AS NEEDED    Greater than 50% of 30 min face to face time with patient was spent on counseling and coordination of care. We discussed Sarah Welch has the diagnoses noted above and was not well controlled on Wellbutrin and Lexapro.  Her symptoms of depression and anxiety are better with the switch to duloxetine 30 mg twice daily.   Also dx PD without med  changes previously tends to have some dizziness if she takes 60 mg at once.  Otherwise she is tolerating the current dosage.  She has had near resolution of depression and resolution of the irritability.  She still has some episodic minor anxiety which is overall improved.  She is functioning better and more active.  She is satisfied with the med response at this time.  Doubt bipolar but described sx and call if gets manic  She still takes diazepam on occasion but is not taking it as excessively.  And has not increased it and this is not the source of her fatigue.  Discussed options for fatigue which is better.  Recommend she restart vitamin D.  She asked for the once weekly supplement. Modafinil can be used off label for fatigue and people with chronic illnesses such as Parkinson's disease.  This may be helpful.  Discussed side effects in detail Modafinil 200 mg every morning prn didn't seem to help much  Needs better balance with work and stress and it should get better after estate.  Disc recent FMLA and no more is currently available to her.  Disc ways to balance this as much as possible.  Continue current duloxetine 30 BID, try moving both doses back tomorning. trazodone 50-100 mg if needed for sleep  Disc SE in detail and SSRI withdrawal sx.  If it fails call back  Added sertraline 50 mg daily for panic and anxiety It is uncommon to use an SSRI and an SNRI together.  However her depression has not responded to pure SSRIs in the past.  She has had some improvement in depression with the SNRI duloxetine but cannot tolerate a higher dose.  We want to avoid augmentation with partial dopamine agonist or any other antipsychotic due to Parkinson's disease.  Therefore the most efficient and effective way to reduce her anxiety which is her chief complaint would be the addition of a low-dose SSRI to be combined with the duloxetine.  There is a low risk of serotonin syndrome because the dose of the  sertraline will be low  We discussed the short-term risks associated with benzodiazepines including sedation and increased fall risk among others.  Discussed long-term side effect risk including dependence, potential withdrawal symptoms, and the potential eventual dose-related risk of dementia.  But recent studies from 2020 dispute this association between benzodiazepines and dementia risk. Newer studies in 2020 do not support an association with dementia.  Continue vit s D, B, add MVI  for [place].    Follow-up 4 mos  Call if you have any additional symptoms or problems with the medication.  Sarah Parents MD, DFAPA  Please see After Visit Summary for patient specific instructions.  Future Appointments  Date Time Provider [place]  08/18/2022  8:15 AM Tat, Eustace Quail, DO [id number] None    No orders of the defined types were placed in this encounter.    -------------------------------

## 2022-07-05 ENCOUNTER — Ambulatory Visit: Payer: Federal, State, Local not specified - PPO | Admitting: Nurse Practitioner

## 2022-07-05 ENCOUNTER — Encounter: Payer: Self-pay | Admitting: Nurse Practitioner

## 2022-07-05 VITALS — BP 120/82 | HR 78 | Temp 97.4°F | Wt 128.8 lb

## 2022-07-05 DIAGNOSIS — G8929 Other chronic pain: Secondary | ICD-10-CM | POA: Insufficient documentation

## 2022-07-05 DIAGNOSIS — M546 Pain in thoracic spine: Secondary | ICD-10-CM

## 2022-07-05 MED ORDER — TIZANIDINE HCL 4 MG PO TABS: 4.0000 mg | ORAL_TABLET | Freq: Three times a day (TID) | ORAL | 1 refills | Status: DC | PRN

## 2022-07-05 NOTE — Assessment & Plan Note (Signed)
She has been experiencing back spasms and pain for the past 2 days.  She denies initial injury.  She does have some tenderness to right SI joint area.  We will have her start daily stretches, she can use lidocaine patches as needed for pain.  We will also start her on tizanidine as needed for muscle spasms.  Follow-up if symptoms worsen or do not improve.

## 2022-07-05 NOTE — Progress Notes (Signed)
   Acute Office Visit  Subjective:     Patient ID: Sarah Welch, female    DOB: 1971/03/05, 51 y.o.   MRN: [id number]  Chief Complaint  Patient presents with   Spasms    Pt c/o possible muscle spasms  in both sides of back x1 day. Pain range between 1-7 throughout the day.     HPI Patient is in today for back pain and muscle spasms for 2 days.  She states that yesterday after getting out of the shower she noticed severe back pain in her right lower lumbar area that went away after driving to work.  She said she took it easy and moves slow.  When she woke up this morning she was experiencing muscle spasms and pain in her thoracic back area bilaterally.  She states this is happened before, however it was several years ago.  She denies injury.  Turning certain ways makes the pain worse.  She has not taken anything over-the-counter at this point.  She denies chest pain, shortness of breath, incontinence, fevers.  ROS See pertinent positives and negatives per HPI.     Objective:    BP 120/82   Pulse 78   Temp (!) 97.4 F (36.3 C) (Temporal)   Wt 128 lb 12.8 oz (58.4 kg)   LMP 01/14/2016 (Exact Date)   SpO2 97%   BMI 22.11 kg/m    Physical Exam Vitals and nursing note reviewed.  Constitutional:      General: She is not in acute distress.    Appearance: Normal appearance.  HENT:     Head: Normocephalic.  Eyes:     Conjunctiva/sclera: Conjunctivae normal.  Pulmonary:     Effort: Pulmonary effort is normal.  Musculoskeletal:        General: Tenderness (Right SI area) present. No swelling.     Cervical back: Normal range of motion.  Skin:    General: Skin is warm.  Neurological:     General: No focal deficit present.     Mental Status: She is alert and oriented to person, place, and time.  Psychiatric:        Mood and Affect: Mood normal.        Behavior: Behavior normal.        Thought Content: Thought content normal.        Judgment: Judgment normal.       Assessment & Plan:   Problem List Items Addressed This Visit       Other   Acute bilateral thoracic back pain - Primary    She has been experiencing back spasms and pain for the past 2 days.  She denies initial injury.  She does have some tenderness to right SI joint area.  We will have her start daily stretches, she can use lidocaine patches as needed for pain.  We will also start her on tizanidine as needed for muscle spasms.  Follow-up if symptoms worsen or do not improve.      Relevant Medications   tiZANidine (ZANAFLEX) 4 MG tablet    Meds ordered this encounter  Medications   tiZANidine (ZANAFLEX) 4 MG tablet    Sig: Take 1 tablet (4 mg total) by mouth every 8 (eight) hours as needed for muscle spasms.    Dispense:  30 tablet    Refill:  1    Return if symptoms worsen or fail to improve.  Charyl Dancer, NP

## 2022-07-05 NOTE — Patient Instructions (Signed)
It was great to see you!  Start back stretches daily. You can take tizanidine as needed for muscle spasms. You can also use a lidocaine patch to help with the pain.   Let's follow-up if your symptoms worsen or don't improve.   Take care,  Vance Peper, NP

## 2022-07-06 ENCOUNTER — Ambulatory Visit: Payer: Federal, State, Local not specified - PPO | Admitting: Family Medicine

## 2022-07-27 ENCOUNTER — Telehealth: Payer: Self-pay

## 2022-07-27 NOTE — Telephone Encounter (Signed)
Pt is requesting to have Tdap. It needs to be up to date.   Please  advise

## 2022-07-29 ENCOUNTER — Encounter: Payer: Self-pay | Admitting: Nurse Practitioner

## 2022-07-31 ENCOUNTER — Other Ambulatory Visit: Payer: Self-pay | Admitting: Neurology

## 2022-07-31 ENCOUNTER — Telehealth: Payer: Self-pay | Admitting: Neurology

## 2022-07-31 DIAGNOSIS — G20A1 Parkinson's disease without dyskinesia, without mention of fluctuations: Secondary | ICD-10-CM

## 2022-08-01 ENCOUNTER — Encounter: Payer: Self-pay | Admitting: Neurology

## 2022-08-01 ENCOUNTER — Other Ambulatory Visit: Payer: Self-pay

## 2022-08-01 DIAGNOSIS — G20A1 Parkinson's disease without dyskinesia, without mention of fluctuations: Secondary | ICD-10-CM

## 2022-08-01 MED ORDER — CARBIDOPA-LEVODOPA ER 50-200 MG PO TBCR: 1.0000 | EXTENDED_RELEASE_TABLET | Freq: Every day | ORAL | 0 refills | Status: DC

## 2022-08-01 NOTE — Telephone Encounter (Signed)
Pharmacy called to be sure carbidopa levodopa is extended release, 50-200 mg.

## 2022-08-01 NOTE — Telephone Encounter (Signed)
Pharmacy called back an informed that yes like I called in this morning she takes the carbidopa/levodopa 50/200 CR at bed all of the pt medications was called in this am

## 2022-08-01 NOTE — Telephone Encounter (Signed)
Prescription resent in and pharmacy called

## 2022-08-02 ENCOUNTER — Ambulatory Visit (INDEPENDENT_AMBULATORY_CARE_PROVIDER_SITE_OTHER): Payer: Federal, State, Local not specified - PPO

## 2022-08-02 DIAGNOSIS — Z23 Encounter for immunization: Secondary | ICD-10-CM

## 2022-08-02 DIAGNOSIS — E538 Deficiency of other specified B group vitamins: Secondary | ICD-10-CM | POA: Diagnosis not present

## 2022-08-02 MED ORDER — CYANOCOBALAMIN 1000 MCG/ML IJ SOLN
1000.0000 ug | Freq: Once | INTRAMUSCULAR | Status: AC
  Administered 2022-08-02: 1000 ug via INTRAMUSCULAR

## 2022-08-02 NOTE — Progress Notes (Signed)
..  After obtaining consent, and per orders of Lauren McElwee, injection of Tdap and B12 given by Augustina Mood. Patient tolerated injection well and instructed to remain in clinic for 20 minutes afterwards, and to report any adverse reaction to me immediately.

## 2022-08-09 ENCOUNTER — Ambulatory Visit (INDEPENDENT_AMBULATORY_CARE_PROVIDER_SITE_OTHER): Payer: Federal, State, Local not specified - PPO

## 2022-08-09 DIAGNOSIS — E538 Deficiency of other specified B group vitamins: Secondary | ICD-10-CM

## 2022-08-09 MED ORDER — CYANOCOBALAMIN 1000 MCG/ML IJ SOLN
1000.0000 ug | Freq: Once | INTRAMUSCULAR | Status: AC
  Administered 2022-08-09: 1000 ug via INTRAMUSCULAR

## 2022-08-09 NOTE — Progress Notes (Signed)
Per orders of Vance Peper NP  pt is here for b12 injection. Pt received b12 injection in left Deltoid at 9:25. Given by [place] L . CMA/CPT. Pt tolerated B12 injection well. Pt will return in 1 week to get next b12 injection.

## 2022-08-15 NOTE — Progress Notes (Signed)
Assessment/Plan:   1.  Parkinsons Disease  -continue carbidopa/levodopa 25/100, 2 po tid-qid             -Continue carbidopa/levodopa 50/200 at bedtime.             -continue amantadine100 mg tid.  She has some livedo reticularis but would rather deal with that than they dyskinesia.  She tried to decrease it but didn't like the excess dyskinesia.  May consider gocovri in future  -discussed dbs along with r/b/se  -discussed exercise  -may retry requip in future.  She d/c as didn't think it helped but was only at starting dose    2.  Restless leg             -Continue carbidopa/levodopa 50/200 CR at bedtime   3.  GAD/MDD             -Under the care of Dr. Clovis Pu.  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   Weight loss  -stable today but still quite thin compared to prior baseline  -doubt Parkinsons Disease related.  Likely related to anxiety  5.  Lightheadedness  -discussed increased water  -discussed that valium, levodopa and Parkinsons Disease all may be decreasing her BP  Subjective:   Sarah Welch was seen today in follow up for Parkinsons disease.  My previous records were reviewed prior to todays visit as well as outside records available to me.  We started the patient on ropinirole last visit.  She has had no compulsive behaviors or sleep attacks.  She reports that she didn't like SE and took herself off of it.  She states that she wasn't sure it helped and she wonders if maybe it made it worse.  She is leaving her current job and is going to UC at [employer] (where she previously was).  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    Noting lightheadedness  Current prescribed movement disorder medications:  Carbidopa/levodopa 25/100, 2 po  tid-qid (she is getting in 8 per day but only took 6 when on vacation) Carbidopa/levodopa 50/200 q hs  Amantadine 100 mg 3 times per day  Requip, 1 mg 3 times daily (started last visit)  PREVIOUS MEDICATIONS:  citalopram, Lexapro; Wellbutrin; azilect (no help); pramipexole (swelling, nausea); neupro (states too expensive); ropinrole (not sure it helped but only at starting dose when she d/c)  ALLERGIES:   Allergies  Allergen Reactions   Ciprofloxacin Other (See Comments)    SEVERE JOINT PAIN Other reaction(s): Unknown   Pramipexole Other (See Comments)    Could not get up for 24 hours   Oxycodone-Acetaminophen     Other reaction(s): Unknown    CURRENT MEDICATIONS:  Current Meds  Medication Sig   amantadine (SYMMETREL) 100 MG capsule TAKE ONE CAPSULE BY MOUTH EVERY MORNING, AT NOON, AND AT BEDTIME   carbidopa-levodopa (SINEMET CR) 50-200 MG tablet Take 1 tablet by mouth at bedtime.   carbidopa-levodopa (SINEMET IR) 25-100 MG tablet TAKE 2 TABLETS BY MOUTH DAILY AS DIRECTED. MAY TAKE UP TO 8 TABLETS PER DAY.   diazepam (VALIUM) 5 MG tablet TAKE ONE TO ONE AND A HALF TABLETS BY MOUTH TWICE DAILY AS NEEDED   DULoxetine (CYMBALTA) 30 MG capsule Take 1 capsule (30 mg total)  by mouth 2 (two) times daily.   estradiol (ESTRACE) 0.1 MG/GM vaginal cream Use 1/2 g vaginally two or three times per week as needed to maintain symptom relief.   sertraline (ZOLOFT) 50 MG tablet Take 1 tablet (50 mg total) by mouth daily.   tiZANidine (ZANAFLEX) 4 MG tablet Take 1 tablet (4 mg total) by mouth every 8 (eight) hours as needed for muscle spasms.   traZODone (DESYREL) 50 MG tablet Take 1-2 tablets (50-100 mg total) by mouth at bedtime.   Vitamin D, Ergocalciferol, (DRISDOL) 1.25 MG (50000 UNIT) CAPS capsule Take 1 capsule (50,000 Units total) by mouth every 7 (seven) days.     Objective:   PHYSICAL EXAMINATION:    VITALS:   Vitals:   08/18/22 0803  BP: 114/79  Pulse: 90  SpO2: 100%  Weight:  128 lb 6.4 oz (58.2 kg)  Height: '5\' 4"'$  (1.626 m)    GEN:  The patient appears stated age and is in NAD. HEENT:  Normocephalic, atraumatic.  The mucous membranes are moist. The superficial temporal arteries are without ropiness or tenderness.  Neurological examination:  Orientation: The patient is alert and oriented x3. Cranial nerves: There is good facial symmetry without facial hypomimia. The speech is fluent and clear. Soft palate rises symmetrically and there is no tongue deviation. Hearing is intact to conversational tone. Sensation: Sensation is intact to light touch throughout Motor: Strength is at least antigravity x4.  Movement examination: Tone: There is nl tone in the UE/LE Abnormal movements: there is LLE dyskinesia (same as previous) Coordination:  There is min decremation with RAM's, with any form of RAMS, including alternating supination and pronation of the forearm, hand opening and closing, finger taps, heel taps and toe taps. Gait and Station: The patient has no difficulty arising out of a deep-seated chair without the use of the hands. The patient's stride length is good with arm swing bilaterally  I have reviewed and interpreted the following labs independently    Chemistry      Component Value Date/Time   NA 139 01/17/2022 1041   NA 141 08/14/2019 0828   K 3.9 01/17/2022 1041   CL 104 01/17/2022 1041   CO2 29 01/17/2022 1041   BUN 12 01/17/2022 1041   BUN 13 08/14/2019 0828   CREATININE 0.83 01/17/2022 1041   CREATININE 0.77 12/28/2020 1406      Component Value Date/Time   CALCIUM 9.4 01/17/2022 1041   ALKPHOS 55 01/17/2022 1041   AST 18 01/17/2022 1041   ALT 6 01/17/2022 1041   BILITOT 0.8 01/17/2022 1041   BILITOT 0.6 08/14/2019 0828       Lab Results  Component Value Date   WBC 4.7 01/17/2022   HGB 14.2 01/17/2022   HCT 41.9 01/17/2022   MCV 92.3 01/17/2022   PLT 205.0 01/17/2022    Lab Results  Component Value Date   TSH 0.78 01/17/2022    Total time spent on today's visit was 31 minutes, including both face-to-face time and nonface-to-face time.  Time included that spent on review of records (prior notes available to me/labs/imaging if pertinent), discussing treatment and goals, answering patient's questions and coordinating care.    Cc:  Charyl Dancer, NP

## 2022-08-16 ENCOUNTER — Ambulatory Visit (INDEPENDENT_AMBULATORY_CARE_PROVIDER_SITE_OTHER): Payer: Federal, State, Local not specified - PPO

## 2022-08-16 DIAGNOSIS — E538 Deficiency of other specified B group vitamins: Secondary | ICD-10-CM | POA: Diagnosis not present

## 2022-08-16 MED ORDER — CYANOCOBALAMIN 1000 MCG/ML IJ SOLN
1000.0000 ug | Freq: Once | INTRAMUSCULAR | Status: AC
  Administered 2022-08-16: 1000 ug via INTRAMUSCULAR

## 2022-08-16 NOTE — Progress Notes (Signed)
Per orders of Lauren McEwee pt is here for b12 injection. Pt received b12 injection in Right Deltoid at 12:15pm. Given by [place] L . CMA/CPT. Pt tolerated B12 injection well. Pt will return in 1 week to get next b12 injection.  Next injection will be final injection, until re-evaluation of B12.

## 2022-08-18 ENCOUNTER — Ambulatory Visit: Payer: Federal, State, Local not specified - PPO | Admitting: Neurology

## 2022-08-18 ENCOUNTER — Encounter: Payer: Self-pay | Admitting: Neurology

## 2022-08-18 VITALS — BP 114/79 | HR 90 | Ht 64.0 in | Wt 128.4 lb

## 2022-08-18 DIAGNOSIS — G20B2 Parkinson's disease with dyskinesia, with fluctuations: Secondary | ICD-10-CM

## 2022-08-18 NOTE — Patient Instructions (Signed)
Local and Online Resources for Power over Parkinson's Group  November 2023    LOCAL [place] PARKINSON'S GROUPS   Power over Parkinson's Group:    Power Over Parkinson's Patient Education Group will be Wednesday, November 8th-*Hybrid meting*- in person at [facility] location and via [facility], 2:00-3:00 pm.   Starting in November, Power over [employer] and Care Partner Groups will meet together, with plans for separate break out session for caregivers (*this will be evolving over the next few months) Upcoming Power over Parkinson's Meetings/Care Partner Support:  2nd Wednesdays of the month at 2 pm:   November 8th, December 13th  [place] at [email or web address] if interested in participating in this group    [place] and Fall Prevention Workshop.  Thursday, November 9th 1-2pm, [employer] [place], [employer].  Register with Vonna Kotyk at [email or web address] or [phone] New PWR! Moves [employer]ne[employer] Instructor-Led Classes offering at [employer]!  TUESDAYS and Wednesdays 1-2 pm.   Contact Vonna Kotyk at  [email or web address]  or [phone] (Tuesday classes are modified for chair and standing only) Dance for Parkinson 's classes will be on Tuesdays 9:30am-10:30am starting October 3-December 12 with a break the week of November 21st. Located in the [employer] , in the first floor of the [employer] ([place].) To register:  [email or web address] or [phone]  Drumming for Parkinson's will be held on 2nd and 4th Mondays at 11:00 am.   Located at the [place] ([place]  [place] at [email or web address] or [phone]  Through support from the [place] for Parkinson's classes are free for both patients and caregivers.    [employer] Parkinson's Tai Chi  Class, Mondays at 11 am.  Call [phone] for details Parkinson's Holiday Party.  Wednesday, December 6th, 4:00-5:00 pm.  [facility] and Fitness.  RSVP to Garnetta Buddy at [phone] or [email or web address]   [place]:  [email or web address]  PD Health at Home continues:  Mindfulness Mondays, Wellness Wednesdays, Fitness Fridays   Upcoming Education:   Why Should you Participate in Parkinson's Research?  Wednesday, Nov. 29th,  1-2 pm  Expert Briefing:    Hallucinations and Delusions in Parkinson's.  Wednesday, Nov. 8th, 1-2 pm  Register for expert briefings (webinars) at [email or web address]  Please check out their website to sign up for emails and see their full online offerings      [place]:  [email or web address]   Third Thursday Webinars:  On the third Thursday of every month at 12 p.m. ET, join our free live webinars to learn about various aspects of living with Parkinson's disease and our work to speed medical breakthroughs.  Upcoming Webinar:  A Year Like No Other in Parkinson's Research:  2023 in Review.  Thursday, November 16th 12 noon. Check out additional information on their website to see their full online offerings    [facility]:  [email or web address]  Upcoming Webinar:   Stay tuned  Webinar Series:  Living with Parkinson's Meetup.   Third Thursdays each month, 3 pm  Care Partner Monthly Meetup.  With Robin Searing Phinney.  First Tuesday of each month, 2 pm  Check out additional information to Live Well Today on their website    Parkinson and Movement Disorders (PMD) Alliance:  www.[email or web address]  [employer]:  Online Education Events  Sign up for emails, which are sent weekly to give  you updates on programming and online offerings    Parkinson's Association of the [place]:  www.[email or web address]   Information on online support groups, education events, and online exercises including Yoga, Parkinson's exercises and more-LOTS of information on links to PD resources and online events  Virtual Support Group through Parkinson's Association of the [place]; next one is scheduled for Wednesday, November 1st  at 2 pm.  (These are typically scheduled for the 1st Wednesday of the month at 2 pm).  Visit website for details.   MOVEMENT AND EXERCISE OPPORTUNITIES  PWR! Moves Classes at [place].  Wednesdays 10 and 11 am.   Contact Amy Marriott, PT [email or web address] if interested.  NEW PWR! Moves Class offerings at [employer].  *TUESDAYS* and Wednesdays 1-2 pm.  Contact Vonna Kotyk at  [email or web address]    Parkinson's Wellness Recovery (PWR! Moves)  [email or web address]  Info on the PWR! Virtual Experience:  You will have access to our expertise?through self-assessment, guided plans that start with the PD-specific fundamentals, educational content, tips, Q&A with an expert, and a growing Art therapist of PD-specific pre-recorded and live exercise classes of varying types and intensity - both physical and cognitive! If that is not enough, we offer 1:1 wellness consultations (in-person or virtual) to personalize your PWR! Research scientist (medical).   [place] Fridays:   As part of the PD Health @ Home program, this free video series focuses each week on one aspect of fitness designed to support people living with Parkinson's.? These weekly videos highlight the [place] fitness guidelines for people with Parkinson's disease.  [email or web address]   Dance for PD website is offering free, live-stream classes throughout the week, as well as links to [employer] of classes:  [email or web address]  Virtual dance and Pilates for Parkinson's classes: Click on the Community Tab> Parkinson's  Movement Initiative Tab.  To register for classes and for more information, visit [email or web address] and click the "community" tab.   [employer] Parkinson's Cycling Classes   [facility] [employer]:  Thursdays @ Noon-Live classes at [employer] ([employer] at [email or web address]?or [phone])  [facility]: Virtual Classes Mondays and Thursdays Jeanette Caprice classes Tuesday, Wednesday and Thursday (contact [place] at [email or web address] ?or [phone])  [place]  Varied levels of classes are offered Tuesdays and Thursdays at [employer].   Stretching with Verdis Frederickson weekly class is also offered for people with Parkinson's  To observe a class or for more information, call [phone] or email Hezzie Bump at [email or web address]   ADDITIONAL SUPPORT AND RESOURCES  Well[employer]:Online Caregiver Education Opportunities:  www.well-[email or web address]/caregiver-education/caregiver-support-group.  You may also contact Vickki Muff at [email or web address] or [phone].     Well-Spring Navigator:  Just1Navigator program, a?free service to help individuals and families through the journey of determining care for older adults.  The "Navigator" is a Education officer, museum, Arnell Asal, who will speak with a prospective client and/or loved ones to provide an assessment of the situation and a set of recommendations for a personalized care plan -- all free of charge, and whether?Well[employer]Spring [employer] offers the needed service or not. If the need is not a service we provide, we are well-connected with reputable programs in town that we can refer you to.  www.well-[email or web address] or to speak with the Navigator, call [phone].

## 2022-08-23 ENCOUNTER — Ambulatory Visit: Payer: Federal, State, Local not specified - PPO

## 2022-08-24 ENCOUNTER — Ambulatory Visit (INDEPENDENT_AMBULATORY_CARE_PROVIDER_SITE_OTHER): Payer: Federal, State, Local not specified - PPO

## 2022-08-24 DIAGNOSIS — E538 Deficiency of other specified B group vitamins: Secondary | ICD-10-CM | POA: Diagnosis not present

## 2022-08-24 MED ORDER — CYANOCOBALAMIN 1000 MCG/ML IJ SOLN
1000.0000 ug | Freq: Once | INTRAMUSCULAR | Status: AC
  Administered 2022-08-24: 1000 ug via INTRAMUSCULAR

## 2022-08-24 NOTE — Progress Notes (Signed)
Per orders of Vance Peper NP pt is here for b12 injection. Pt received b12 injection in right Deltoid at 2:20 pm. Given by [place] L . CMA/CPT. Pt tolerated B12 injection well. Pt has completed B12 series as instructed by provider.

## 2022-08-25 ENCOUNTER — Ambulatory Visit: Payer: Federal, State, Local not specified - PPO | Admitting: Family Medicine

## 2022-08-25 ENCOUNTER — Encounter: Payer: Self-pay | Admitting: Family Medicine

## 2022-08-25 VITALS — BP 124/84 | HR 77 | Temp 98.0°F | Wt 128.8 lb

## 2022-08-25 DIAGNOSIS — H538 Other visual disturbances: Secondary | ICD-10-CM | POA: Diagnosis not present

## 2022-08-25 DIAGNOSIS — H5713 Ocular pain, bilateral: Secondary | ICD-10-CM | POA: Diagnosis not present

## 2022-08-25 MED ORDER — LEVOCETIRIZINE DIHYDROCHLORIDE 5 MG PO TABS: 5.0000 mg | ORAL_TABLET | Freq: Every evening | ORAL | 0 refills | Status: DC

## 2022-08-25 MED ORDER — FLUTICASONE PROPIONATE 50 MCG/ACT NA SUSP: 2.0000 | Freq: Every day | NASAL | 6 refills | Status: DC

## 2022-08-25 MED ORDER — PATADAY 0.7 % OP SOLN: OPHTHALMIC | 0 refills | Status: DC

## 2022-08-25 NOTE — Progress Notes (Signed)
Assessment/Plan:   Problem List Items Addressed This Visit   None Visit Diagnoses     Blurry vision, bilateral    -  Primary   Relevant Medications   levocetirizine (XYZAL ALLERGY 24HR) 5 MG tablet   fluticasone (FLONASE) 50 MCG/ACT nasal spray   Olopatadine HCl (PATADAY) 0.7 % SOLN   Other Relevant Orders   Ambulatory referral to Ophthalmology   Pain of both eyes       Relevant Medications   levocetirizine (XYZAL ALLERGY 24HR) 5 MG tablet   fluticasone (FLONASE) 50 MCG/ACT nasal spray   Olopatadine HCl (PATADAY) 0.7 % SOLN   Other Relevant Orders   Ambulatory referral to Ophthalmology      Possible allergic conjunctivitis.  Trial of intranasal steroids and antihistamines.  Referral to ophthalmology given blurred vision.    Subjective:  HPI:  Sarah Welch is a 51 y.o. female who has Dysfunctional uterine bleeding; Cervical radiculopathy; Hemiparesis ([place]); Parkinson's disease ([place]); Abnormal involuntary movement; Acute right-sided low back pain without sciatica; Allergy to pollen; Anxiety; Headache; Leg cramps; Restless leg; Tremor of left hand; Unintentional weight loss; Colon cancer screening; Drug-induced constipation; Herpes zoster without complication; and Acute bilateral thoracic back pain on their problem list..   She  has a past medical history of Abnormal Pap smear of cervix (1990) and Parkinson disease (12/15)..   She presents with chief complaint of Burning under right and left eye (Itching and burning, prickly feeling under eyes. Tried treating with eye drops and benadryl with no relief. Feels like grit. ) .   Itching eyes.  Patient reports 1 month of itchy eyes.  It has associated with a pain described as gritty feeling.  Does report some mild blurry vision.  Reports that the pain is intermittent.  She has tried Benadryl and topical Visine without improvement.    Past Surgical History:  Procedure Laterality Date   AUGMENTATION MAMMAPLASTY Bilateral  2004   implants, saline   COLPOSCOPY     DILATION AND CURETTAGE OF UTERUS  1992   following SAB    Outpatient Medications Prior to Visit  Medication Sig Dispense Refill   amantadine (SYMMETREL) 100 MG capsule TAKE ONE CAPSULE BY MOUTH EVERY MORNING, AT NOON, AND AT BEDTIME 270 capsule 0   carbidopa-levodopa (SINEMET CR) 50-200 MG tablet Take 1 tablet by mouth at bedtime. 90 tablet 0   carbidopa-levodopa (SINEMET IR) 25-100 MG tablet TAKE 2 TABLETS BY MOUTH DAILY AS DIRECTED. MAY TAKE UP TO 8 TABLETS PER DAY. 720 tablet 0   diazepam (VALIUM) 5 MG tablet TAKE ONE TO ONE AND A HALF TABLETS BY MOUTH TWICE DAILY AS NEEDED 90 tablet 2   DULoxetine (CYMBALTA) 30 MG capsule Take 1 capsule (30 mg total) by mouth 2 (two) times daily. 180 capsule 0   estradiol (ESTRACE) 0.1 MG/GM vaginal cream Use 1/2 g vaginally two or three times per week as needed to maintain symptom relief. 42.5 g 2   sertraline (ZOLOFT) 50 MG tablet Take 1 tablet (50 mg total) by mouth daily. 180 tablet 1   tiZANidine (ZANAFLEX) 4 MG tablet Take 1 tablet (4 mg total) by mouth every 8 (eight) hours as needed for muscle spasms. 30 tablet 1   traZODone (DESYREL) 50 MG tablet Take 1-2 tablets (50-100 mg total) by mouth at bedtime. 180 tablet 1   Vitamin D, Ergocalciferol, (DRISDOL) 1.25 MG (50000 UNIT) CAPS capsule Take 1 capsule (50,000 Units total) by mouth every 7 (seven) days. 15 capsule 3  No facility-administered medications prior to visit.    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[employer]    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  Tobacco Use   Smoking status: Former    Packs/day: 0.50    Years: 25.00    Total pack years: 12.50    Types: Cigarettes    Quit date: 07/04/2016    Years since quitting: 6.1   Smokeless tobacco: Never  Vaping Use   Vaping Use: Never used  Substance and Sexual Activity   Alcohol use: No    Alcohol/week: 0.0 standard drinks of alcohol   Drug use: No   Sexual activity: Yes    Partners: Male    Birth control/protection: Post-menopausal    Comment: first intercourse >15, less than 5 partners  Other Topics Concern   Not on file  Social History Narrative   Right handed   Lives with husband on a 2 level home   Caffeine 1 cup day   Social Determinants of Health   Financial Resource Strain: Not on file  Food Insecurity: Not on file  Transportation Needs: Not on file  Physical Activity: Not on file  Stress: Not on file  Social Connections: Not on file  Intimate Partner Violence: Not on file                                                                                                 Objective:  Physical Exam: BP 124/84 (BP Location: Left Arm, Patient Position: Sitting, Cuff Size: Large)   Pulse 77   Temp 98 F (36.7 C) (Temporal)   Wt 128 lb 12.8 oz (58.4 kg)   LMP 01/14/2016 (Exact Date)   SpO2 99%   BMI 22.11 kg/m    General: No acute distress. Awake and conversant.  Eyes: Normal conjunctiva, anicteric.  PERRLA, EOMI, peripheral visual fields intact.,  No lesions noted upon eversion of bilateral eyelids upper and lower ENT: Hearing grossly intact. No nasal discharge.  Neck: Neck is supple. No masses or thyromegaly.  Respiratory: Respirations are non-labored. No auditory wheezing.  Skin: Warm. No rashes or ulcers.  Psych: Alert and oriented. Cooperative, Appropriate  mood and affect, Normal judgment.  CV: No cyanosis or JVD MSK: Normal ambulation. No clubbing  Neuro: Sensation and CN II-XII grossly normal.        Alesia Banda, MD, MS

## 2022-08-25 NOTE — Patient Instructions (Signed)
For itchy eyes, try pataday eye drops, flonase nose spray and xyxal  Try to follow up with your optomitrist  We have also referred to ophthalmology.

## 2022-10-14 ENCOUNTER — Other Ambulatory Visit: Payer: Self-pay | Admitting: Neurology

## 2022-10-14 DIAGNOSIS — G20A1 Parkinson's disease without dyskinesia, without mention of fluctuations: Secondary | ICD-10-CM

## 2022-10-17 MED ORDER — CARBIDOPA-LEVODOPA 25-100 MG PO TABS: ORAL_TABLET | ORAL | 0 refills | Status: DC

## 2022-10-17 MED ORDER — CARBIDOPA-LEVODOPA ER 50-200 MG PO TBCR: 1.0000 | EXTENDED_RELEASE_TABLET | Freq: Every day | ORAL | 0 refills | Status: DC

## 2022-10-17 MED ORDER — AMANTADINE HCL 100 MG PO CAPS: 100.0000 mg | ORAL_CAPSULE | Freq: Three times a day (TID) | ORAL | 0 refills | Status: DC

## 2022-10-26 ENCOUNTER — Encounter: Payer: Self-pay | Admitting: Psychiatry

## 2022-10-26 ENCOUNTER — Ambulatory Visit: Payer: Federal, State, Local not specified - PPO | Admitting: Psychiatry

## 2022-10-26 DIAGNOSIS — R7989 Other specified abnormal findings of blood chemistry: Secondary | ICD-10-CM

## 2022-10-26 DIAGNOSIS — R5383 Other fatigue: Secondary | ICD-10-CM

## 2022-10-26 DIAGNOSIS — F32A Depression, unspecified: Secondary | ICD-10-CM

## 2022-10-26 DIAGNOSIS — F410 Panic disorder [episodic paroxysmal anxiety] without agoraphobia: Secondary | ICD-10-CM

## 2022-10-26 DIAGNOSIS — F324 Major depressive disorder, single episode, in partial remission: Secondary | ICD-10-CM | POA: Diagnosis not present

## 2022-10-26 DIAGNOSIS — F411 Generalized anxiety disorder: Secondary | ICD-10-CM | POA: Diagnosis not present

## 2022-10-26 DIAGNOSIS — F331 Major depressive disorder, recurrent, moderate: Secondary | ICD-10-CM

## 2022-10-26 DIAGNOSIS — F5105 Insomnia due to other mental disorder: Secondary | ICD-10-CM

## 2022-10-26 MED ORDER — TRAZODONE HCL 50 MG PO TABS: 50.0000 mg | ORAL_TABLET | Freq: Every day | ORAL | 1 refills | Status: DC

## 2022-10-26 MED ORDER — DULOXETINE HCL 30 MG PO CPEP: 30.0000 mg | ORAL_CAPSULE | Freq: Two times a day (BID) | ORAL | 1 refills | Status: DC

## 2022-10-26 NOTE — Progress Notes (Addendum)
Sarah Welch [id number] 1971-09-19 52 y.o.  Subjective:   [name] is a 52 y.o. (DOB June 30, 1971) female.  Chief Complaint:  Chief Complaint  Patient presents with   Follow-up   Depression   Anxiety    HPI Sarah Welch presents to the office today for follow-up of depression and anxiety.  01/2020 appt with the following noted and no med changes: Going good. Better motivated and she feelks better.  Occ anxiety spontaneous with diazepam helpful.  Overall anxiety level of anxiety is better and less depression.  Most improvement in the last 3 weeks with better activity.  Irritability resolved. Tolerating duloxetine prefers AM and noon.    05/18/2020 appt wit the following noted: Job still stressful but meds are fine.  Short staffed.  Stress eating.  Work out 3 times weekly and plans more.  Fears vaccine mandate will cause more employees to leave.   Diazepam once or twice weekly.  No panic.  11/16/2020 appointment with following noted: Still on duloxetine 30 BID and rare diazepam.  PD meds without change. Sleep problems for a couple of months.  EMA and initial insomnia. Caffeine before lunch 2 cups.  Some nights only 2 hours sleep. Depression might be a little worse, but tired of not sleeping.  Anxiety is OK.  Work is better.  Average 3 hours sleep per night. Plan: start trazodone 50 and continue duloxetine 30 BID  01/12/21 appt noted:  Trazodone partly helped.  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  Don't know where it's coming from.  Not like that in years.  H notices but not complaining.  Harder to get to sleep those days.  It's gone in the morning.  Going on for 3 weeks. No history of mania.  Plan: Continue current duloxetine 30 BID, try moving both doses back tomorning. trazodone can increase to 100 mg if needed for sleep  02/05/2021 urgent appointment scheduled at patient  request: Day after seen.  M died in her sleep.  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  Old dog is dying.  Straw broke camel's back was job stressor.  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  Sleep is OK.  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  Has told people she can't do it.    04/29/21 Average diazepam about 4 days per week. Continues duloxetine 30 BID, Trazodone 50-100 HS. No SE. Still dealing with estate things and work stress.  Got off 6 weeks from work but not STD.   Function at work seems OK.  Still gets anxiety attacks with SOB at work about once daily but fights through it.  Still short staffed at work. Sleep 10-530 usually. Not able to find much enjoyment usually.   Feels she is overextended and that makes it hard to relax into after hour activites.  Hope when estate is resolved things will get better. Plan: Continue current duloxetine 30 BID, try moving both doses back tomorning. trazodone can increase to 100 mg if needed for sleep  07/28/21 appt noted: Sleep all the time if can now.  So tired for a couple of weeks. Taking diazepam more rarely.  Some weeks none. No med changes. NO change PD meds. Don't know if depressed.  No reason.  Too tired to enjoy things.  Not a lot  of nervousness. 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  Energy better but not normal.   Patient reports stable mood and denies depressed or irritable moods.  Patient denies any recent difficulty with anxiety.  Patient denies difficulty with sleep initiation or maintenance.  Patient reports that energy and motivation have been good.  Patient denies any difficulty with concentration.  Patient denies any suicidal ideation. Modafinil 200 noticeable but hasn't needed it in awhile. Can get overwhelmed with workload but otherwise anxiety  ok Lost weight to 143# without trying.  Highest ever 160# Plan: no med changes  04/07/22 appt noted: Fine until a month ago anxiety attacks out of nowhere.  At [facility], started sobbing without reason or trigger other than being hot. Drove home.  Then got in a rage with trigger going home.  No accidents on incidents.  That was the worst but other episodes anxiety with hands tingling. No other rage episodes except quick temper as a kid.   OK the next day other than tired. Lost wt without trying.  Current wt about 130#.   Uses pill box and doesn't think she's missing any meds. Not generally more anxious usually.  Nor irrtiable. Job is still real stressful and PD worse in afternoon. Dep 6-7/10 Saturdays wants to shop usually but now getting anxious about it. Sx worse in last 5 weeks.  No trigger. Fri-Sunday EMA and crashed Monday.  EMA in the last week. Plan: Continue current duloxetine 30 BID, try moving both doses back tomorning. trazodone 50-100 mg if needed for sleep  Disc SE in detail and SSRI withdrawal sx.  If it fails call back Add sertraline 25- then 50 mg daily for panic and anxiety  06/22/22 appt noted: It's helped with less anxiety and 1-2 panic since here.  Tired all the time. Wt loss stopped 128#. Taking B complex and D.   Added Ropinirole BID for PD. Consistent with sertraline 50 for panic and duloxetine 60 mg daily.  Occ diazepam. Depression ok.  Dong alright.    10/26/22 appt noted: Good overall. Stopped sertraline  in Dec bc too much med.  Also stopped ropinirole. Felt fine bc too much meds. Switched jobs.  36 hours per week with good breaks. Didn't notice any difference good or bad. Tired constantly. Depression is a litle better.  Frustrated not getting things done like she would like.  Motivated but tired. Meds duloxetine 30 BID, rare diazepam 5 mg .  No other psych meds.  Exercises with PD group and feels good physically. No family history of bipolar.  Past  Psychiatric History:  No psychiatrist or counselor. Past Psychiatric Medication Trials: Celexa, Lexapro, Wellbutrin SE, duloxetine 30 BID Sertraline 50 helped anxiety Diazepam Remote Ambien Modafinil 200 little effect  Review of Systems:  Review of Systems  Cardiovascular: Negative for chest pain and palpitations.  Neurological: Positive for tremors. Negative for dizziness and weakness.  Ongoing fatigue unchanged  Medications: I have reviewed the patient's current medications.  Current Outpatient Medications  Medication Sig Dispense Refill   amantadine (SYMMETREL) 100 MG capsule Take 1 capsule (100 mg total) by mouth 3 (three) times daily. 270 capsule 0   carbidopa-levodopa (SINEMET CR) 50-200 MG tablet Take 1 tablet by mouth at bedtime. 90 tablet 0   carbidopa-levodopa (SINEMET IR) 25-100 MG tablet TAKE 2 TABLETS BY MOUTH DAILY AS DIRECTED. MAY TAKE UP  TO 8 TABLETS PER DAY. 720 tablet 0   diazepam (VALIUM) 5 MG tablet TAKE ONE TO ONE AND A HALF TABLETS BY MOUTH TWICE DAILY AS NEEDED 90 tablet 2   estradiol (ESTRACE) 0.1 MG/GM vaginal cream Use 1/2 g vaginally two or three times per week as needed to maintain symptom relief. 42.5 g 2   fluticasone (FLONASE) 50 MCG/ACT nasal spray Place 2 sprays into both nostrils daily. 16 g 6   Olopatadine HCl (PATADAY) 0.7 % SOLN Use 1 drop in both eyes daily as needed for itching eyes 2.5 mL 0   tiZANidine (ZANAFLEX) 4 MG tablet Take 1 tablet (4 mg total) by mouth every 8 (eight) hours as needed for muscle spasms. 30 tablet 1   Vitamin D, Ergocalciferol, (DRISDOL) 1.25 MG (50000 UNIT) CAPS capsule Take 1 capsule (50,000 Units total) by mouth every 7 (seven) days. 15 capsule 3   DULoxetine (CYMBALTA) 30 MG capsule Take 1 capsule (30 mg total) by mouth 2 (two) times daily. 180 capsule 1   levocetirizine (XYZAL ALLERGY 24HR) 5 MG tablet Take 1 tablet (5 mg total) by mouth every evening. 30 tablet 0   traZODone (DESYREL) 50 MG tablet Take 1-2 tablets  (50-100 mg total) by mouth at bedtime. 180 tablet 1   No current facility-administered medications for this visit.    Medication Side Effects: None  Allergies:  Allergies  Allergen Reactions   Ciprofloxacin Other (See Comments)    SEVERE JOINT PAIN Other reaction(s): Unknown   Pramipexole Other (See Comments)    Could not get up for 24 hours   Oxycodone-Acetaminophen     Other reaction(s): Unknown    Past Medical History:  Diagnosis Date   Abnormal Pap smear of cervix 1990   unsure abnormality, colpo OK--hx of cryotherapy to cervix   Parkinson disease 12/15    Family History  Problem Relation Age of Onset   Heart disease Mother    Alcohol abuse Father    Healthy Sister    Diabetes Maternal Grandmother    Hypertension Maternal Grandmother    Diabetes Maternal Grandfather    Hypertension Maternal Grandfather    Stroke Paternal Grandmother    Heart attack Paternal Grandfather    Healthy Daughter     Social History   Socioeconomic History   Marital status: Married    Spouse name: Not on file   Number of children: 1   Years of education: Not on file   Highest education level: Associate degree: academic program  Occupational History   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[employer]    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  Tobacco Use   Smoking status: Former    Packs/day: 0.50    Years: 25.00    Total pack years: 12.50    Types: Cigarettes    Quit date: 07/04/2016    Years since quitting: 6.3   Smokeless tobacco: Never  Vaping Use   Vaping Use: Never used  Substance and Sexual Activity   Alcohol use: No    Alcohol/week: 0.0 standard drinks of alcohol   Drug use: No   Sexual activity: Yes    Partners: Male    Birth control/protection: Post-menopausal    Comment: first intercourse >15, less than 5 partners  Other Topics Concern   Not on file  Social History Narrative   Right handed   Lives with husband on a 2 level home   Caffeine 1 cup day    Social Determinants of  Health   Financial Resource Strain: Not on file  Food Insecurity: Not on file  Transportation Needs: Not on file  Physical Activity: Not on file  Stress: Not on file  Social Connections: Not on file  Intimate Partner Violence: Not on file    Past Medical History, Surgical history, Social history, and Family history were reviewed and updated as appropriate.   Please see review of systems for further details on the patient's review from today.   Objective:   Physical Exam:  LMP 01/14/2016 (Exact Date)   Physical Exam Constitutional:      General: She is not in acute distress. Musculoskeletal:        General: No deformity.  Neurological:     Mental Status: She is alert and oriented to person, place, and time.     Motor: No weakness.     Coordination: Coordination normal.  VERY FIDGETY ? RELATED STOPPING ROPINIROLE BUT NOT UNMANAGEABLE. Psychiatric:        Attention and Perception: Attention and perception normal. She does not perceive auditory or visual hallucinations.        Mood and Affect: Mood is less depressed. Mood is less anxious. Affect is not labile, blunt, angry, tearful or inappropriate.        Speech: Speech normal.        Behavior: Behavior normal.        Thought Content: Thought content normal. Thought content is not paranoid or delusional. Thought content does not include homicidal or suicidal ideation. Thought content does not include homicidal or suicidal plan.        Cognition and Memory: Cognition and memory normal.        Judgment: Judgment normal.     Comments: Insight intact      Lab Review:     Component Value Date/Time   NA 139 01/17/2022 1041   NA 141 08/14/2019 0828   K 3.9 01/17/2022 1041   CL 104 01/17/2022 1041   CO2 29 01/17/2022 1041   GLUCOSE 88 01/17/2022 1041   BUN 12 01/17/2022 1041   BUN 13 08/14/2019 0828   CREATININE 0.83 01/17/2022 1041   CREATININE 0.77 12/28/2020 1406   CALCIUM 9.4 01/17/2022 1041    PROT 7.1 01/17/2022 1041   PROT 7.0 08/14/2019 0828   ALBUMIN 4.6 01/17/2022 1041   ALBUMIN 4.2 08/14/2019 0828   AST 18 01/17/2022 1041   ALT 6 01/17/2022 1041   ALKPHOS 55 01/17/2022 1041   BILITOT 0.8 01/17/2022 1041   BILITOT 0.6 08/14/2019 0828   GFRNONAA >60 01/19/2020 1233   GFRAA >60 01/19/2020 1233       Component Value Date/Time   WBC 4.7 01/17/2022 1041   RBC 4.54 01/17/2022 1041   HGB 14.2 01/17/2022 1041   HGB 14.6 08/14/2019 0828   HGB 13.2 04/02/2015 0905   HCT 41.9 01/17/2022 1041   HCT 43.7 08/14/2019 0828   PLT 205.0 01/17/2022 1041   PLT 257 08/14/2019 0828   MCV 92.3 01/17/2022 1041   MCV 94 08/14/2019 0828   MCH 30.8 12/28/2020 1406   MCHC 34.0 01/17/2022 1041   RDW 12.9 01/17/2022 1041   RDW 12.1 08/14/2019 0828   LYMPHSABS 1.5 01/17/2022 1041   MONOABS 0.3 01/17/2022 1041   EOSABS 0.1 01/17/2022 1041   BASOSABS 0.0 01/17/2022 1041    No results found for: "POCLITH", "LITHIUM"   No results found for: "PHENYTOIN", "PHENOBARB", "VALPROATE", "CBMZ"   01/17/22 labs including TSH, B12, CMP, CBC, D all  normal  .res Assessment: Plan:    Sarah Welch was seen today for follow-up, depression and anxiety.  Diagnoses and all orders for this visit:  Major depressive disorder with single episode, in partial remission (HCC) -     DULoxetine (CYMBALTA) 30 MG capsule; Take 1 capsule (30 mg total) by mouth 2 (two) times daily.  Panic disorder without agoraphobia -     DULoxetine (CYMBALTA) 30 MG capsule; Take 1 capsule (30 mg total) by mouth 2 (two) times daily.  Generalized anxiety disorder -     DULoxetine (CYMBALTA) 30 MG capsule; Take 1 capsule (30 mg total) by mouth 2 (two) times daily.  Fatigue due to depression  Insomnia due to mental condition -     traZODone (DESYREL) 50 MG tablet; Take 1-2 tablets (50-100 mg total) by mouth at bedtime.  Low vitamin D level     Greater than 50% of 30 min face to face time with patient was spent on  counseling and coordination of care. We discussed Sarah Welch has the diagnoses noted above and was not well controlled on Wellbutrin and Lexapro.  Her symptoms of depression and anxiety are better with the switch to duloxetine 30 mg twice daily.   Also dx PD without med changes previously tends to have some dizziness if she takes 60 mg at once.  Otherwise she is tolerating the current dosage.  She has had near resolution of depression and resolution of the irritability.  She still has some episodic minor anxiety which is overall improved.  She is functioning better and more active.  She is satisfied with the med response at this time.  Doubt bipolar but described sx and call if gets manic  Discussed options for fatigue which is better.  Recommend she restart vitamin D.  She asked for the once weekly supplement. Modafinil can be used off label for fatigue and people with chronic illnesses such as Parkinson's disease.  This may be helpful.  Discussed side effects in detail Stopped Modafinil 200 mg every morning prn didn't seem to help much.  Option retry up to 400 mg bc primary problem DT tiredness.  Continue current duloxetine 30 BID, try moving both doses back tomorning. trazodone 50-100 mg if needed for sleep  Disc SE in detail and SSRI withdrawal sx.  If it fails call back  She stopped sertraline 50 mg daily for panic and anxiety in Dec early It is uncommon to use an SSRI and an SNRI together.  However her depression has not responded to pure SSRIs in the past.  She has had some improvement in depression with the SNRI duloxetine but cannot tolerate a higher dose.  We want to avoid augmentation with partial dopamine agonist or any other antipsychotic due to Parkinson's disease.  Therefore the most efficient and effective way to reduce her anxiety which is her chief complaint would be the addition of a low-dose SSRI to be combined with the duloxetine.  There is a low risk of serotonin syndrome because the  dose of the sertraline will be low HOWEVER, she is 7 weeks off sertraline wihtout return anxiety so far.  Disc risk it could still happen but change in job helped.  We discussed the short-term risks associated with benzodiazepines including sedation and increased fall risk among others.  Discussed long-term side effect risk including dependence, potential withdrawal symptoms, and the potential eventual dose-related risk of dementia.  But recent studies from 2020 dispute this association between benzodiazepines and dementia risk. Newer studies in  2020 do not support an association with dementia. Rarely takes BZ  VERY FIDGETY ? RELATED STOPPING ROPINIROLE BUT NOT UNMANAGEABLE.  Not uncomfortable.  Continue vit s D, B, add MVI for fatiuge.    Follow-up 4 mos  Call if you have any additional symptoms or problems with the medication.  Sarah Parents MD, DFAPA  Please see After Visit Summary for patient specific instructions.  Future Appointments  Date Time Provider [place]  03/15/2023  8:15 AM Tat, Eustace Quail, DO [facility] None  04/26/2023  2:30 PM Cottle, Billey Co., MD [facility] None    No orders of the defined types were placed in this encounter.    -------------------------------

## 2022-11-16 DIAGNOSIS — R42 Dizziness and giddiness: Secondary | ICD-10-CM | POA: Diagnosis not present

## 2022-11-16 DIAGNOSIS — J019 Acute sinusitis, unspecified: Secondary | ICD-10-CM | POA: Diagnosis not present

## 2022-12-21 ENCOUNTER — Ambulatory Visit: Payer: Federal, State, Local not specified - PPO | Admitting: Nurse Practitioner

## 2022-12-21 VITALS — BP 116/70 | HR 86 | Temp 98.0°F | Ht 64.0 in | Wt 145.0 lb

## 2022-12-21 DIAGNOSIS — M546 Pain in thoracic spine: Secondary | ICD-10-CM

## 2022-12-21 DIAGNOSIS — G20A1 Parkinson's disease without dyskinesia, without mention of fluctuations: Secondary | ICD-10-CM | POA: Diagnosis not present

## 2022-12-21 DIAGNOSIS — R634 Abnormal weight loss: Secondary | ICD-10-CM

## 2022-12-21 MED ORDER — TIZANIDINE HCL 4 MG PO TABS: 4.0000 mg | ORAL_TABLET | Freq: Three times a day (TID) | ORAL | 1 refills | Status: DC | PRN

## 2022-12-21 NOTE — Assessment & Plan Note (Signed)
Chronic, stable.  Follows with neurology.  Continue collaboration recommendations from neurology. ?

## 2022-12-21 NOTE — Assessment & Plan Note (Signed)
Continue stretches daily.  She can also continue tizanidine 4 mg 3 times a day as needed for muscle spasms.  Follow-up as symptoms worsen or any concerns.

## 2022-12-21 NOTE — Assessment & Plan Note (Signed)
Resolved. She has gained back 15 pounds since her last visit.  All of her labs and imaging were normal.  This was most likely related to stress.

## 2022-12-21 NOTE — Patient Instructions (Signed)
It was great to see you!  I have sent some refills of the muscle relaxer to your pharmacy.   Let's follow-up in 3-6 months, sooner if you have concerns.  If a referral was placed today, you will be contacted for an appointment. Please note that routine referrals can sometimes take up to 3-4 weeks to process. Please call our office if you haven't heard anything after this time frame.  Take care,  Vance Peper, NP

## 2022-12-21 NOTE — Progress Notes (Signed)
Established Patient Office Visit  Subjective   Patient ID: Sarah Welch, female    DOB: Nov 29, 1970  Age: 52 y.o. MRN: [id number]  Chief Complaint  Patient presents with   Medication Management    No concerns    HPI  Sarah Welch is here to follow-up on chronic disease management.   She states that she is doing well overall.  She is still following with neurology for Parkinson's.  She is taking amantadine 100 mg 3 times a day and Sinemet.  She has been having ongoing intermittent back pain with spasms.  She has been taking tizanidine as needed which helps with her pain.  She has also been doing stretching regularly.  She would like to make sure she has a refill of her tizanidine at the pharmacy.  She has gained back a lot of the weight that she has lost.  All of her labs were normal, was most likely related to stress.  She denies chest pain and shortness of breath.    ROS See pertinent positives and negatives per HPI.    Objective:     BP 116/70 (BP Location: Right Arm)   Pulse 86   Temp 98 F (36.7 C)   Ht 5\' 4"  (1.626 m)   Wt 145 lb (65.8 kg)   LMP 01/14/2016 (Exact Date)   SpO2 99%   BMI 24.89 kg/m  BP Readings from Last 3 Encounters:  12/21/22 116/70  08/25/22 124/84  08/18/22 114/79   Wt Readings from Last 3 Encounters:  12/21/22 145 lb (65.8 kg)  08/25/22 128 lb 12.8 oz (58.4 kg)  08/18/22 128 lb 6.4 oz (58.2 kg)      Physical Exam Vitals and nursing note reviewed.  Constitutional:      General: She is not in acute distress.    Appearance: Normal appearance.  HENT:     Head: Normocephalic.  Eyes:     Conjunctiva/sclera: Conjunctivae normal.  Cardiovascular:     Rate and Rhythm: Normal rate and regular rhythm.     Pulses: Normal pulses.     Heart sounds: Normal heart sounds.  Pulmonary:     Effort: Pulmonary effort is normal.     Breath sounds: Normal breath sounds.  Musculoskeletal:     Cervical back: Normal range of motion.   Skin:    General: Skin is warm.  Neurological:     General: No focal deficit present.     Mental Status: She is alert and oriented to person, place, and time.  Psychiatric:        Mood and Affect: Mood normal.        Behavior: Behavior normal.        Thought Content: Thought content normal.        Judgment: Judgment normal.    The 10-year ASCVD risk score (Arnett DK, et al., 2019) is: 2.5%    Assessment & Plan:   Problem List Items Addressed This Visit       Nervous and Auditory   Parkinson's disease ([place]) - Primary    Chronic, stable.  Follows with neurology.  Continue collaboration recommendations from neurology.        Other   Unintentional weight loss    Resolved. She has gained back 15 pounds since her last visit.  All of her labs and imaging were normal.  This was most likely related to stress.      Acute bilateral thoracic back pain    Continue stretches daily.  She can also continue tizanidine 4 mg 3 times a day as needed for muscle spasms.  Follow-up as symptoms worsen or any concerns.      Relevant Medications   tiZANidine (ZANAFLEX) 4 MG tablet    Return in about 3 months (around 03/22/2023) for 3-6 months CPE.    Charyl Dancer, NP

## 2023-01-01 ENCOUNTER — Other Ambulatory Visit: Payer: Self-pay | Admitting: Psychiatry

## 2023-01-01 DIAGNOSIS — F5105 Insomnia due to other mental disorder: Secondary | ICD-10-CM

## 2023-01-18 DIAGNOSIS — H0288A Meibomian gland dysfunction right eye, upper and lower eyelids: Secondary | ICD-10-CM | POA: Diagnosis not present

## 2023-01-18 DIAGNOSIS — H04123 Dry eye syndrome of bilateral lacrimal glands: Secondary | ICD-10-CM | POA: Diagnosis not present

## 2023-01-18 DIAGNOSIS — H0288B Meibomian gland dysfunction left eye, upper and lower eyelids: Secondary | ICD-10-CM | POA: Diagnosis not present

## 2023-01-18 DIAGNOSIS — H2513 Age-related nuclear cataract, bilateral: Secondary | ICD-10-CM | POA: Diagnosis not present

## 2023-01-25 ENCOUNTER — Other Ambulatory Visit: Payer: Self-pay | Admitting: Neurology

## 2023-01-25 DIAGNOSIS — G20A1 Parkinson's disease without dyskinesia, without mention of fluctuations: Secondary | ICD-10-CM

## 2023-01-25 MED ORDER — CARBIDOPA-LEVODOPA 25-100 MG PO TABS: ORAL_TABLET | ORAL | 0 refills | Status: DC

## 2023-01-25 MED ORDER — CARBIDOPA-LEVODOPA ER 50-200 MG PO TBCR: 1.0000 | EXTENDED_RELEASE_TABLET | Freq: Every day | ORAL | 0 refills | Status: DC

## 2023-01-31 DIAGNOSIS — J3089 Other allergic rhinitis: Secondary | ICD-10-CM | POA: Diagnosis not present

## 2023-02-16 ENCOUNTER — Ambulatory Visit: Payer: Federal, State, Local not specified - PPO | Admitting: Neurology

## 2023-02-26 ENCOUNTER — Other Ambulatory Visit: Payer: Self-pay | Admitting: Neurology

## 2023-02-26 DIAGNOSIS — G20A1 Parkinson's disease without dyskinesia, without mention of fluctuations: Secondary | ICD-10-CM

## 2023-02-27 MED ORDER — AMANTADINE HCL 100 MG PO CAPS: 100.0000 mg | ORAL_CAPSULE | Freq: Three times a day (TID) | ORAL | 0 refills | Status: DC

## 2023-03-07 ENCOUNTER — Encounter: Payer: Self-pay | Admitting: Nurse Practitioner

## 2023-03-07 ENCOUNTER — Ambulatory Visit (INDEPENDENT_AMBULATORY_CARE_PROVIDER_SITE_OTHER): Payer: Federal, State, Local not specified - PPO | Admitting: Nurse Practitioner

## 2023-03-07 VITALS — BP 134/82 | HR 75 | Temp 97.8°F | Ht 64.0 in | Wt 149.0 lb

## 2023-03-07 DIAGNOSIS — F419 Anxiety disorder, unspecified: Secondary | ICD-10-CM | POA: Diagnosis not present

## 2023-03-07 DIAGNOSIS — G20A1 Parkinson's disease without dyskinesia, without mention of fluctuations: Secondary | ICD-10-CM | POA: Diagnosis not present

## 2023-03-07 DIAGNOSIS — M546 Pain in thoracic spine: Secondary | ICD-10-CM

## 2023-03-07 DIAGNOSIS — Z136 Encounter for screening for cardiovascular disorders: Secondary | ICD-10-CM | POA: Diagnosis not present

## 2023-03-07 DIAGNOSIS — K5903 Drug induced constipation: Secondary | ICD-10-CM

## 2023-03-07 DIAGNOSIS — G8929 Other chronic pain: Secondary | ICD-10-CM

## 2023-03-07 DIAGNOSIS — Z0000 Encounter for general adult medical examination without abnormal findings: Secondary | ICD-10-CM | POA: Diagnosis not present

## 2023-03-07 DIAGNOSIS — E559 Vitamin D deficiency, unspecified: Secondary | ICD-10-CM | POA: Diagnosis not present

## 2023-03-07 LAB — LIPID PANEL
Cholesterol: 160 mg/dL (ref 0–200)
HDL: 69.5 mg/dL (ref >39.00)
LDL Cholesterol: 77 mg/dL (ref 0–99)
NonHDL: 90.64
Total CHOL/HDL Ratio: 2
Triglycerides: 66 mg/dL (ref 0.0–149.0)
VLDL: 13.2 mg/dL (ref 0.0–40.0)

## 2023-03-07 LAB — CBC WITH DIFFERENTIAL/PLATELET
Basophils Absolute: 0 10*3/uL (ref 0.0–0.1)
Basophils Relative: 0.6 % (ref 0.0–3.0)
Eosinophils Absolute: 0.1 10*3/uL (ref 0.0–0.7)
Eosinophils Relative: 1.8 % (ref 0.0–5.0)
HCT: 41.1 % (ref 36.0–46.0)
Hemoglobin: 13.7 g/dL (ref 12.0–15.0)
Lymphocytes Relative: 35.6 % (ref 12.0–46.0)
Lymphs Abs: 2.2 10*3/uL (ref 0.7–4.0)
MCHC: 33.3 g/dL (ref 30.0–36.0)
MCV: 92.4 fl (ref 78.0–100.0)
Monocytes Absolute: 0.5 10*3/uL (ref 0.1–1.0)
Monocytes Relative: 7.9 % (ref 3.0–12.0)
Neutro Abs: 3.4 10*3/uL (ref 1.4–7.7)
Neutrophils Relative %: 54.1 % (ref 43.0–77.0)
Platelets: 219 10*3/uL (ref 150.0–400.0)
RBC: 4.44 Mil/uL (ref 3.87–5.11)
RDW: 12.7 % (ref 11.5–15.5)
WBC: 6.3 10*3/uL (ref 4.0–10.5)

## 2023-03-07 LAB — COMPREHENSIVE METABOLIC PANEL
ALT: 5 U/L (ref 0–35)
AST: 14 U/L (ref 0–37)
Albumin: 4.2 g/dL (ref 3.5–5.2)
Alkaline Phosphatase: 60 U/L (ref 39–117)
BUN: 8 mg/dL (ref 6–23)
CO2: 30 mEq/L (ref 19–32)
Calcium: 9.2 mg/dL (ref 8.4–10.5)
Chloride: 100 mEq/L (ref 96–112)
Creatinine, Ser: 0.76 mg/dL (ref 0.40–1.20)
GFR: 89.49 mL/min (ref >60.00)
Glucose, Bld: 84 mg/dL (ref 70–99)
Potassium: 4.4 mEq/L (ref 3.5–5.1)
Sodium: 137 mEq/L (ref 135–145)
Total Bilirubin: 0.6 mg/dL (ref 0.2–1.2)
Total Protein: 6.8 g/dL (ref 6.0–8.3)

## 2023-03-07 LAB — TSH: TSH: 0.86 u[IU]/mL (ref 0.35–5.50)

## 2023-03-07 LAB — VITAMIN D 25 HYDROXY (VIT D DEFICIENCY, FRACTURES): VITD: 48.46 ng/mL (ref 30.00–100.00)

## 2023-03-07 NOTE — Progress Notes (Signed)
BP 134/82 (BP Location: Left Arm)   Pulse 75   Temp 97.8 F (36.6 C) (Oral)   Ht 5\' 4"  (1.626 m)   Wt 149 lb (67.6 kg)   LMP 01/14/2016 (Exact Date)   SpO2 96%   BMI 25.58 kg/m    Subjective:    Patient ID: Sarah Welch, female    DOB: [birth year], 52 y.o.   MRN: [id number]  CC: Chief Complaint  Patient presents with   Annual Exam    With lab work, patient is not fasting, concerns with constipation    HPI: Sarah Welch is a 52 y.o. female presenting on 03/07/2023 for comprehensive medical examination. Current medical complaints include: constipation  She has been having trouble with constipation. She has been trying to take to increase fluids and eat prunes, however that does not seem to be helping.   She currently lives with: husband Menopausal Symptoms: no  Depression and Anxiety Screen done today and results listed below:     03/07/2023   10:41 AM 12/21/2022    3:26 PM 08/25/2022   11:17 AM 02/01/2022    1:21 PM 01/17/2022   10:09 AM  Depression screen PHQ 2/9  Decreased Interest 0 0 1 0 1  Down, Depressed, Hopeless 0 0 1 0   PHQ - 2 Score 0 0 2 0 1  Altered sleeping 1 1 1  1   Tired, decreased energy 1 2 3  3   Change in appetite 0 1 0  1  Feeling bad or failure about yourself  0 0 0  0  Trouble concentrating 0 0 1  1  Moving slowly or fidgety/restless 0 0 0  0  Suicidal thoughts 0 0 0  0  PHQ-9 Score 2 4 7  7   Difficult doing work/chores Somewhat difficult Somewhat difficult Somewhat difficult        03/07/2023   10:41 AM 12/21/2022    3:26 PM 08/25/2022   11:18 AM 01/17/2022   10:10 AM  GAD 7 : Generalized Anxiety Score  Nervous, Anxious, on Edge 1 1 1 3   Control/stop worrying 1 0 1 2  Worry too much - different things 1 0 1 2  Trouble relaxing 1 0 1   Restless 1 0 0 3  Easily annoyed or irritable 1 0 1 1  Afraid - awful might happen 0 0 1 0  Total GAD 7 Score 6 1 6    Anxiety Difficulty Somewhat difficult Somewhat difficult Somewhat difficult  Very difficult    The patient has a history of falls. I did complete a risk assessment for falls. A plan of care for falls was documented.   Past Medical History:  Past Medical History:  Diagnosis Date   Abnormal Pap smear of cervix 1990   unsure abnormality, colpo OK--hx of cryotherapy to cervix   Parkinson disease 12/15    Surgical History:  Past Surgical History:  Procedure Laterality Date   AUGMENTATION MAMMAPLASTY Bilateral 2004   implants, saline   COLPOSCOPY     DILATION AND CURETTAGE OF UTERUS  1992   following SAB    Medications:  Current Outpatient Medications on File Prior to Visit  Medication Sig   amantadine (SYMMETREL) 100 MG capsule TAKE ONE CAPSULE BY MOUTH THREE TIMES A DAY   amantadine (SYMMETREL) 100 MG capsule Take 1 capsule (100 mg total) by mouth 3 (three) times daily.   carbidopa-levodopa (SINEMET CR) 50-200 MG tablet Take 1 tablet by mouth at bedtime.  carbidopa-levodopa (SINEMET IR) 25-100 MG tablet TAKE 2 TABLETS BY MOUTH DAILY AS DIRECTED. MAY TAKE UP TO 8 TABLETS PER DAY.   diazepam (VALIUM) 5 MG tablet TAKE ONE TO ONE AND A HALF TABLETS BY MOUTH TWICE DAILY AS NEEDED   DULoxetine (CYMBALTA) 30 MG capsule Take 1 capsule (30 mg total) by mouth 2 (two) times daily.   estradiol (ESTRACE) 0.1 MG/GM vaginal cream Use 1/2 g vaginally two or three times per week as needed to maintain symptom relief.   tiZANidine (ZANAFLEX) 4 MG tablet Take 1 tablet (4 mg total) by mouth every 8 (eight) hours as needed for muscle spasms.   traZODone (DESYREL) 50 MG tablet TAKE ONE TO TWO TABLETS BY MOUTH AT BEDTIME   Vitamin D, Ergocalciferol, (DRISDOL) 1.25 MG (50000 UNIT) CAPS capsule Take 1 capsule (50,000 Units total) by mouth every 7 (seven) days.   No current facility-administered medications on file prior to visit.    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: [employer]    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  Tobacco Use   Smoking status: Former    Packs/day: 0.50    Years: 25.00    Additional pack years: 0.00    Total pack years: 12.50    Types: Cigarettes    Quit date: 07/04/2016    Years since quitting: 6.6   Smokeless tobacco: Never  Vaping Use   Vaping Use: Never used  Substance and Sexual Activity   Alcohol use: No    Alcohol/week: 0.0 standard drinks of alcohol   Drug use: No   Sexual activity: Yes    Partners: Male    Birth control/protection: Post-menopausal    Comment: first intercourse >15, less than 5 partners  Other Topics Concern   Not on file  Social History Narrative   Right handed   Lives with husband on a 2 level home   Caffeine 1 cup day   Social Determinants of Health   Financial Resource Strain: Low Risk  (12/21/2022)   Overall Financial Resource Strain (CARDIA)    Difficulty of Paying Living Expenses: Not very hard  Food Insecurity: No Food Insecurity (12/21/2022)   Hunger Vital Sign    Worried About Running Out of Food in the Last Year: Never true    Ran Out of Food in the Last Year: Never true  Transportation Needs: No Transportation Needs (12/21/2022)   PRAPARE - Administrator, Civil Service (Medical): No    Lack of Transportation (Non-Medical): No  Physical Activity: Insufficiently Active (12/21/2022)   Exercise Vital Sign    Days of Exercise per Week: 2 days    Minutes of Exercise per Session: 60 min  Stress: No Stress Concern Present (12/21/2022)    [employer] of Occupational Health - Occupational Stress Questionnaire    Feeling of Stress : Only a little  Social Connections: Moderately Integrated (  12/21/2022)   Social Connection and Isolation Panel [NHANES]    Frequency of Communication with Friends and Family: More than three times a week    Frequency of Social Gatherings with Friends and Family: Patient declined    Attends Religious Services: More than 4 times per year    Active Member of [employer] or Organizations: No    Attends Engineer, structural: Not on file    Marital Status: Married  Catering manager Violence: Not on file   Social History   Tobacco Use  Smoking Status Former   Packs/day: 0.50   Years: 25.00   Additional pack years: 0.00   Total pack years: 12.50   Types: Cigarettes   Quit date: 07/04/2016   Years since quitting: 6.6  Smokeless Tobacco Never   Social History   Substance and Sexual Activity  Alcohol Use No   Alcohol/week: 0.0 standard drinks of alcohol    Family History:  Family History  Problem Relation Age of Onset   Heart disease Mother    Alcohol abuse Father    Healthy Sister    Diabetes Maternal Grandmother    Hypertension Maternal Grandmother    Diabetes Maternal Grandfather    Hypertension Maternal Grandfather    Stroke Paternal Grandmother    Heart attack Paternal Grandfather    Healthy Daughter     Past medical history, surgical history, medications, allergies, family history and social history reviewed with patient today and changes made to appropriate areas of the chart.   Review of Systems  Constitutional: Negative.   HENT: Negative.    Eyes: Negative.   Respiratory: Negative.    Cardiovascular: Negative.   Gastrointestinal:  Positive for constipation. Negative for abdominal pain and diarrhea.  Genitourinary: Negative.   Musculoskeletal:  Positive for myalgias.  Skin: Negative.   Neurological: Negative.   Psychiatric/Behavioral: Negative.     All other  ROS negative except what is listed above and in the HPI.      Objective:    BP 134/82 (BP Location: Left Arm)   Pulse 75   Temp 97.8 F (36.6 C) (Oral)   Ht 5\' 4"  (1.626 m)   Wt 149 lb (67.6 kg)   LMP 01/14/2016 (Exact Date)   SpO2 96%   BMI 25.58 kg/m   Wt Readings from Last 3 Encounters:  03/07/23 149 lb (67.6 kg)  12/21/22 145 lb (65.8 kg)  08/25/22 128 lb 12.8 oz (58.4 kg)    Physical Exam Vitals and nursing note reviewed.  Constitutional:      General: She is not in acute distress.    Appearance: Normal appearance.  HENT:     Head: Normocephalic and atraumatic.     Right Ear: Tympanic membrane, ear canal and external ear normal.     Left Ear: Tympanic membrane, ear canal and external ear normal.  Eyes:     Conjunctiva/sclera: Conjunctivae normal.  Cardiovascular:     Rate and Rhythm: Normal rate and regular rhythm.     Pulses: Normal pulses.     Heart sounds: Normal heart sounds.  Pulmonary:     Effort: Pulmonary effort is normal.     Breath sounds: Normal breath sounds.  Abdominal:     Palpations: Abdomen is soft.     Tenderness: There is no abdominal tenderness.  Musculoskeletal:        General: Normal range of motion.     Cervical back: Normal range of motion and neck supple.     Right lower  leg: No edema.     Left lower leg: No edema.  Lymphadenopathy:     Cervical: No cervical adenopathy.  Skin:    General: Skin is warm and dry.  Neurological:     General: No focal deficit present.     Mental Status: She is alert and oriented to person, place, and time.     Cranial Nerves: No cranial nerve deficit.     Coordination: Coordination normal.     Gait: Gait normal.  Psychiatric:        Mood and Affect: Mood normal.        Behavior: Behavior normal.        Thought Content: Thought content normal.        Judgment: Judgment normal.     Results for orders placed or performed in visit on 05/26/22  POC COVID-19  Result Value Ref Range   SARS  Coronavirus 2 Ag Negative Negative      Assessment & Plan:   Problem List Items Addressed This Visit       Digestive   Drug-induced constipation    Chronic, ongoing. Continue with increased fluids and eating fiber in diet. Start miralax OTC daily as needed for constipation. Follow-up if not improving.         Nervous and Auditory   Parkinson's disease (HCC)    Chronic, stable.  Follows with neurology.  Continue collaboration recommendations from neurology.        Other   Anxiety    Chronic, stable. Continue cymbalta 30mg  daily. Follow-up with any concerns.       Chronic midline thoracic back pain    Chronic, stable. Continue tizanidine 4mg  TID prn muscle spasms. Follow-up with any concerns.       Routine general medical examination at a health care facility - Primary    Health maintenance reviewed and updated. Discussed nutrition, exercise. Check CMP, CBC, TSH today. Follow-up 1 year.        Relevant Orders   CBC with Differential/Platelet   Comprehensive metabolic panel   TSH   Other Visit Diagnoses     Vitamin D insufficiency       Check vitamin D levels and treat based on results.   Relevant Orders   VITAMIN D 25 Hydroxy (Vit-D Deficiency, Fractures)   Screening for cardiovascular condition       Screen lipid panel today.   Relevant Orders   Lipid panel        Follow up plan: Return in about 1 year (around 03/06/2024) for CPE.   LABORATORY TESTING:  - Pap smear:  will schedule with GYN  IMMUNIZATIONS:   - Tdap: Tetanus vaccination status reviewed: last tetanus booster within 10 years. - Influenza: Up to date - Pneumovax: Not applicable - Prevnar: Not applicable - HPV: Not applicable - Zostavax vaccine:  declined today  SCREENING: -Mammogram:  scheduled   - Colonoscopy: Up to date  - Bone Density: Not applicable   PATIENT COUNSELING:   Advised to take 1 mg of folate supplement per day if capable of pregnancy.   Sexuality: Discussed sexually  transmitted diseases, partner selection, use of condoms, avoidance of unintended pregnancy  and contraceptive alternatives.   Advised to avoid cigarette smoking.  I discussed with the patient that most people either abstain from alcohol or drink within safe limits (<=14/week and <=4 drinks/occasion for males, <=7/weeks and <= 3 drinks/occasion for females) and that the risk for alcohol disorders and other health effects rises proportionally with  the number of drinks per week and how often a drinker exceeds daily limits.  Discussed cessation/primary prevention of drug use and availability of treatment for abuse.   Diet: Encouraged to adjust caloric intake to maintain  or achieve ideal body weight, to reduce intake of dietary saturated fat and total fat, to limit sodium intake by avoiding high sodium foods and not adding table salt, and to maintain adequate dietary potassium and calcium preferably from fresh fruits, vegetables, and low-fat dairy products.    stressed the importance of regular exercise  Injury prevention: Discussed safety belts, safety helmets, smoke detector, smoking near bedding or upholstery.   Dental health: Discussed importance of regular tooth brushing, flossing, and dental visits.    NEXT PREVENTATIVE PHYSICAL DUE IN 1 YEAR. Return in about 1 year (around 03/06/2024) for CPE.

## 2023-03-07 NOTE — Assessment & Plan Note (Signed)
Chronic, stable.  Follows with neurology.  Continue collaboration recommendations from neurology. ?

## 2023-03-07 NOTE — Patient Instructions (Signed)
It was great to see you!  We are checking your labs today and will let you know the results via mychart/phone.   You can start miralax once a day as needed for constipation.   Let's follow-up in 1 year, sooner if you have concerns.  If a referral was placed today, you will be contacted for an appointment. Please note that routine referrals can sometimes take up to 3-4 weeks to process. Please call our office if you haven't heard anything after this time frame.  Take care,  Rodman Pickle, NP

## 2023-03-07 NOTE — Assessment & Plan Note (Signed)
Chronic, stable. Continue cymbalta 30mg  daily. Follow-up with any concerns.

## 2023-03-07 NOTE — Assessment & Plan Note (Signed)
Chronic, stable. Continue tizanidine 4mg  TID prn muscle spasms. Follow-up with any concerns.

## 2023-03-07 NOTE — Assessment & Plan Note (Signed)
Health maintenance reviewed and updated. Discussed nutrition, exercise. Check CMP, CBC, TSH today. Follow-up 1 year.   

## 2023-03-07 NOTE — Assessment & Plan Note (Signed)
Chronic, ongoing. Continue with increased fluids and eating fiber in diet. Start miralax OTC daily as needed for constipation. Follow-up if not improving.

## 2023-03-08 ENCOUNTER — Encounter: Payer: Self-pay | Admitting: Nurse Practitioner

## 2023-03-08 MED ORDER — TIZANIDINE HCL 4 MG PO TABS: 4.0000 mg | ORAL_TABLET | Freq: Three times a day (TID) | ORAL | 1 refills | Status: DC | PRN

## 2023-03-15 ENCOUNTER — Ambulatory Visit: Payer: Federal, State, Local not specified - PPO | Admitting: Neurology

## 2023-04-13 ENCOUNTER — Ambulatory Visit: Payer: Federal, State, Local not specified - PPO | Admitting: Neurology

## 2023-04-20 ENCOUNTER — Other Ambulatory Visit: Payer: Self-pay | Admitting: Neurology

## 2023-04-20 DIAGNOSIS — G20A1 Parkinson's disease without dyskinesia, without mention of fluctuations: Secondary | ICD-10-CM

## 2023-04-20 MED ORDER — CARBIDOPA-LEVODOPA 25-100 MG PO TABS
ORAL_TABLET | ORAL | 0 refills | Status: DC
Start: 2023-04-20 — End: 2023-08-01

## 2023-04-20 MED ORDER — CARBIDOPA-LEVODOPA ER 50-200 MG PO TBCR
1.0000 | EXTENDED_RELEASE_TABLET | Freq: Every day | ORAL | 0 refills | Status: DC
Start: 2023-04-20 — End: 2023-08-24

## 2023-04-26 ENCOUNTER — Ambulatory Visit: Payer: Federal, State, Local not specified - PPO | Admitting: Psychiatry

## 2023-04-26 ENCOUNTER — Encounter: Payer: Self-pay | Admitting: Psychiatry

## 2023-04-26 DIAGNOSIS — F5105 Insomnia due to other mental disorder: Secondary | ICD-10-CM

## 2023-04-26 DIAGNOSIS — F410 Panic disorder [episodic paroxysmal anxiety] without agoraphobia: Secondary | ICD-10-CM

## 2023-04-26 DIAGNOSIS — R7989 Other specified abnormal findings of blood chemistry: Secondary | ICD-10-CM

## 2023-04-26 DIAGNOSIS — F324 Major depressive disorder, single episode, in partial remission: Secondary | ICD-10-CM | POA: Diagnosis not present

## 2023-04-26 DIAGNOSIS — F411 Generalized anxiety disorder: Secondary | ICD-10-CM | POA: Diagnosis not present

## 2023-04-26 DIAGNOSIS — R5383 Other fatigue: Secondary | ICD-10-CM

## 2023-04-26 DIAGNOSIS — F419 Anxiety disorder, unspecified: Secondary | ICD-10-CM

## 2023-04-26 DIAGNOSIS — F430 Acute stress reaction: Secondary | ICD-10-CM

## 2023-04-26 DIAGNOSIS — F32A Depression, unspecified: Secondary | ICD-10-CM

## 2023-04-26 MED ORDER — DULOXETINE HCL 30 MG PO CPEP
30.0000 mg | ORAL_CAPSULE | Freq: Two times a day (BID) | ORAL | 1 refills | Status: DC
Start: 2023-04-26 — End: 2023-10-31

## 2023-04-26 MED ORDER — DIAZEPAM 5 MG PO TABS: ORAL_TABLET | ORAL | 1 refills | Status: DC

## 2023-04-26 MED ORDER — VITAMIN D (ERGOCALCIFEROL) 1.25 MG (50000 UNIT) PO CAPS
50000.0000 [IU] | ORAL_CAPSULE | ORAL | 3 refills | Status: DC
Start: 2023-04-26 — End: 2024-06-20

## 2023-04-26 NOTE — Progress Notes (Signed)
Sarah Welch [id number] [birth year] 52 y.o.  Subjective:   [name] is a 52 y.o. (DOB July 19, 1971) female.  Chief Complaint:  Chief Complaint  Patient presents with   Follow-up    Mood and anxiety    HPI Sarah Welch presents to the office today for follow-up of depression and anxiety.  01/2020 appt with the following noted and no med changes: Going good. Better motivated and she feelks better.  Occ anxiety spontaneous with diazepam helpful.  Overall anxiety level of anxiety is better and less depression.  Most improvement in the last 3 weeks with better activity.  Irritability resolved. Tolerating duloxetine prefers AM and noon.    05/18/2020 appt wit the following noted: Job still stressful but meds are fine.  Short staffed.  Stress eating.  Work out 3 times weekly and plans more.  Fears vaccine mandate will cause more employees to leave.   Diazepam once or twice weekly.  No panic.  11/16/2020 appointment with following noted: Still on duloxetine 30 BID and rare diazepam.  PD meds without change. Sleep problems for a couple of months.  EMA and initial insomnia. Caffeine before lunch 2 cups.  Some nights only 2 hours sleep. Depression might be a little worse, but tired of not sleeping.  Anxiety is OK.  Work is better.  Average 3 hours sleep per night. Plan: start trazodone 50 and continue duloxetine 30 BID  01/12/21 appt noted:  Trazodone partly helped.  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  Don't know where it's coming from.  Not like that in years.  H notices but not complaining.  Harder to get to sleep those days.  It's gone in the morning.  Going on for 3 weeks. No history of mania.  Plan: Continue current duloxetine 30 BID, try moving both doses back tomorning. trazodone can increase to 100 mg if needed for sleep  02/05/2021 urgent appointment scheduled at patient  request: Day after seen.  M died in her sleep.  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  Old dog is dying.  Straw broke camel's back was job stressor.  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  Sleep is OK.  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  Has told people she can't do it.    04/29/21 Average diazepam about 4 days per week. Continues duloxetine 30 BID, Trazodone 50-100 HS. No SE. Still dealing with estate things and work stress.  Got off 6 weeks from work but not STD.   Function at work seems OK.  Still gets anxiety attacks with SOB at work about once daily but fights through it.  Still short staffed at work. Sleep 10-530 usually. Not able to find much enjoyment usually.   Feels she is overextended and that makes it hard to relax into after hour activites.  Hope when estate is resolved things will get better. Plan: Continue current duloxetine 30 BID, try moving both doses back tomorning. trazodone can increase to 100 mg if needed for sleep  07/28/21 appt noted: Sleep all the time if can now.  So tired for a couple of weeks. Taking diazepam more rarely.  Some weeks none. No med changes. NO change PD meds. Don't know if depressed.  No reason.  Too tired to enjoy things.  Not a lot  of nervousness. 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  Energy better but not normal.   Patient reports stable mood and denies depressed or irritable moods.  Patient denies any recent difficulty with anxiety.  Patient denies difficulty with sleep initiation or maintenance.  Patient reports that energy and motivation have been good.  Patient denies any difficulty with concentration.  Patient denies any suicidal ideation. Modafinil 200 noticeable but hasn't needed it in awhile. Can get overwhelmed with workload but otherwise anxiety  ok Lost weight to 143# without trying.  Highest ever 160# Plan: no med changes  04/07/22 appt noted: Fine until a month ago anxiety attacks out of nowhere.  At [facility], started sobbing without reason or trigger other than being hot. Drove home.  Then got in a rage with trigger going home.  No accidents on incidents.  That was the worst but other episodes anxiety with hands tingling. No other rage episodes except quick temper as a kid.   OK the next day other than tired. Lost wt without trying.  Current wt about 130#.   Uses pill box and doesn't think she's missing any meds. Not generally more anxious usually.  Nor irrtiable. Job is still real stressful and PD worse in afternoon. Dep 6-7/10 Saturdays wants to shop usually but now getting anxious about it. Sx worse in last 5 weeks.  No trigger. Fri-Sunday EMA and crashed Monday.  EMA in the last week. Plan: Continue current duloxetine 30 BID, try moving both doses back tomorning. trazodone 50-100 mg if needed for sleep  Disc SE in detail and SSRI withdrawal sx.  If it fails call back Add sertraline 25- then 50 mg daily for panic and anxiety  06/22/22 appt noted: It's helped with less anxiety and 1-2 panic since here.  Tired all the time. Wt loss stopped 128#. Taking B complex and D.   Added Ropinirole BID for PD. Consistent with sertraline 50 for panic and duloxetine 60 mg daily.  Occ diazepam. Depression ok.  Dong alright.    10/26/22 appt noted: Good overall. Stopped sertraline  in Dec bc too much med.  Also stopped ropinirole. Felt fine bc too much meds. Switched jobs.  36 hours per week with good breaks. Didn't notice any difference good or bad. Tired constantly. Depression is a litle better.  Frustrated not getting things done like she would like.  Motivated but tired. Meds duloxetine 30 BID, rare diazepam 5 mg , trazodone 100 mg HS.  No other psych meds. Plan no changes  04/26/23 appt noted: Occ trouble sleeping initial or  terminal.  Usually 1030-530.  Seems a little better but occ of insomnia. Caffeine in the AM and then about 2 PM. Anxiety is hard to define at times bc has PD also causing problems.  Will get stiff with PD and it makes her anxious.  But will get anxious SOB without trigger too.  A little more anxious than 6 mos ago.   Mood including dep is about the same.  Worries PD is going to make her worse but sh has done ok with it so far.   No SE known.  Exercises with PD group and feels good physically. No family history of bipolar.  Past Psychiatric History:  No psychiatrist or counselor. Past Psychiatric Medication Trials: Celexa, Lexapro, Wellbutrin SE, duloxetine 30 BID Sertraline 50 helped anxiety Diazepam Remote  Ambien Modafinil 200 little effect  Review of Systems:  Review of Systems  Cardiovascular: Negative for chest pain and palpitations.  Neurological: Positive for tremors. Negative for dizziness and weakness.  Ongoing fatigue unchanged  Medications: I have reviewed the patient's current medications.  Current Outpatient Medications  Medication Sig Dispense Refill   amantadine (SYMMETREL) 100 MG capsule TAKE ONE CAPSULE BY MOUTH THREE TIMES A DAY 270 capsule 0   amantadine (SYMMETREL) 100 MG capsule Take 1 capsule (100 mg total) by mouth 3 (three) times daily. 270 capsule 0   carbidopa-levodopa (SINEMET CR) 50-200 MG tablet Take 1 tablet by mouth at bedtime. 90 tablet 0   carbidopa-levodopa (SINEMET IR) 25-100 MG tablet TAKE 2 TABLETS BY MOUTH DAILY AS DIRECTED. MAY TAKE UP TO 8 TABLETS PER DAY. 720 tablet 0   estradiol (ESTRACE) 0.1 MG/GM vaginal cream Use 1/2 g vaginally two or three times per week as needed to maintain symptom relief. 42.5 g 2   tiZANidine (ZANAFLEX) 4 MG tablet Take 1 tablet (4 mg total) by mouth every 8 (eight) hours as needed for muscle spasms. 30 tablet 1   traZODone (DESYREL) 50 MG tablet TAKE ONE TO TWO TABLETS BY MOUTH AT BEDTIME 180 tablet 0   diazepam  (VALIUM) 5 MG tablet TAKE ONE TO ONE AND A HALF TABLETS BY MOUTH TWICE DAILY AS NEEDED 60 tablet 1   DULoxetine (CYMBALTA) 30 MG capsule Take 1 capsule (30 mg total) by mouth 2 (two) times daily. 180 capsule 1   Vitamin D, Ergocalciferol, (DRISDOL) 1.25 MG (50000 UNIT) CAPS capsule Take 1 capsule (50,000 Units total) by mouth every 7 (seven) days. 15 capsule 3   No current facility-administered medications for this visit.    Medication Side Effects: None  Allergies:  Allergies  Allergen Reactions   Ciprofloxacin Other (See Comments)    SEVERE JOINT PAIN Other reaction(s): Unknown   Pramipexole Other (See Comments)    Could not get up for 24 hours   Oxycodone-Acetaminophen     Other reaction(s): Unknown    Past Medical History:  Diagnosis Date   Abnormal Pap smear of cervix 1990   unsure abnormality, colpo OK--hx of cryotherapy to cervix   Parkinson disease 12/15    Family History  Problem Relation Age of Onset   Heart disease Mother    Alcohol abuse Father    Healthy Sister    Diabetes Maternal Grandmother    Hypertension Maternal Grandmother    Diabetes Maternal Grandfather    Hypertension Maternal Grandfather    Stroke Paternal Grandmother    Heart attack Paternal Grandfather    Healthy Daughter     Social History   Socioeconomic History   Marital status: Married    Spouse name: Not on file   Number of children: 1   Years of education: Not on file   Highest education level: Associate degree: occupational, Scientist, product/process development, or vocational program  Occupational History   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, 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: [employer]    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  Tobacco Use   Smoking status: Former    Current packs/day: 0.00    Average packs/day: 0.5 packs/day for 25.0 years (12.5 ttl pk-yrs)    Types: Cigarettes    Start date: 07/05/1991    Quit date: 07/04/2016    Years since quitting: 6.8   Smokeless tobacco: Never  Vaping Use   Vaping status: Never Used   Substance and Sexual Activity   Alcohol use: No  Alcohol/week: 0.0 standard drinks of alcohol   Drug use: No   Sexual activity: Yes    Partners: Male    Birth control/protection: Post-menopausal    Comment: first intercourse >15, less than 5 partners  Other Topics Concern   Not on file  Social History Narrative   Right handed   Lives with husband on a 2 level home   Caffeine 1 cup day   Social Determinants of Health   Financial Resource Strain: Low Risk  (12/21/2022)   Overall Financial Resource Strain (CARDIA)    Difficulty of Paying Living Expenses: Not very hard  Food Insecurity: No Food Insecurity (12/21/2022)   Hunger Vital Sign    Worried About Running Out of Food in the Last Year: Never true    Ran Out of Food in the Last Year: Never true  Transportation Needs: No Transportation Needs (12/21/2022)   PRAPARE - Administrator, Civil Service (Medical): No    Lack of Transportation (Non-Medical): No  Physical Activity: Insufficiently Active (12/21/2022)   Exercise Vital Sign    Days of Exercise per Week: 2 days    Minutes of Exercise per Session: 60 min  Stress: No Stress Concern Present (12/21/2022)   [employer] of Occupational Health - Occupational Stress Questionnaire    Feeling of Stress : Only a little  Social Connections: Moderately Integrated (12/21/2022)   Social Connection and Isolation Panel [NHANES]    Frequency of Communication with Friends and Family: More than three times a week    Frequency of Social Gatherings with Friends and Family: Patient declined    Attends Religious Services: More than 4 times per year    Active Member of [employer] or Organizations: No    Attends Engineer, structural: Not on file    Marital Status: Married  Catering manager Violence: Not on file    Past Medical History, Surgical history, Social history, and Family history were reviewed and updated as appropriate.   Please see review of systems for further details  on the patient's review from today.   Objective:   Physical Exam:  LMP 01/14/2016 (Exact Date)   Physical Exam Constitutional:      General: She is not in acute distress. Musculoskeletal:        General: No deformity.  Neurological:     Mental Status: She is alert and oriented to person, place, and time.     Motor: No weakness.     Coordination: Coordination normal.  VERY FIDGETY ? RELATED STOPPING ROPINIROLE BUT NOT UNMANAGEABLE. Psychiatric:        Attention and Perception: Attention and perception normal. She does not perceive auditory or visual hallucinations.        Mood and Affect: Mood is less depressed. Mood is less anxious. Still some anxiety and dep situationally not bad and no panic. Affect is not labile, blunt, angry, tearful or inappropriate.        Speech: Speech normal.        Behavior: Behavior normal.        Thought Content: Thought content normal. Thought content is not paranoid or delusional. Thought content does not include homicidal or suicidal ideation. Thought content does not include homicidal or suicidal plan.        Cognition and Memory: Cognition and memory normal.        Judgment: Judgment normal.     Comments: Insight intact      Lab Review:     Component Value  Date/Time   NA 137 03/07/2023 1112   NA 141 08/14/2019 0828   K 4.4 03/07/2023 1112   CL 100 03/07/2023 1112   CO2 30 03/07/2023 1112   GLUCOSE 84 03/07/2023 1112   BUN 8 03/07/2023 1112   BUN 13 08/14/2019 0828   CREATININE 0.76 03/07/2023 1112   CREATININE 0.77 12/28/2020 1406   CALCIUM 9.2 03/07/2023 1112   PROT 6.8 03/07/2023 1112   PROT 7.0 08/14/2019 0828   ALBUMIN 4.2 03/07/2023 1112   ALBUMIN 4.2 08/14/2019 0828   AST 14 03/07/2023 1112   ALT 5 03/07/2023 1112   ALKPHOS 60 03/07/2023 1112   BILITOT 0.6 03/07/2023 1112   BILITOT 0.6 08/14/2019 0828   GFRNONAA >60 01/19/2020 1233   GFRAA >60 01/19/2020 1233       Component Value Date/Time   WBC 6.3 03/07/2023 1112    RBC 4.44 03/07/2023 1112   HGB 13.7 03/07/2023 1112   HGB 14.6 08/14/2019 0828   HGB 13.2 04/02/2015 0905   HCT 41.1 03/07/2023 1112   HCT 43.7 08/14/2019 0828   PLT 219.0 03/07/2023 1112   PLT 257 08/14/2019 0828   MCV 92.4 03/07/2023 1112   MCV 94 08/14/2019 0828   MCH 30.8 12/28/2020 1406   MCHC 33.3 03/07/2023 1112   RDW 12.7 03/07/2023 1112   RDW 12.1 08/14/2019 0828   LYMPHSABS 2.2 03/07/2023 1112   MONOABS 0.5 03/07/2023 1112   EOSABS 0.1 03/07/2023 1112   BASOSABS 0.0 03/07/2023 1112    No results found for: "POCLITH", "LITHIUM"   No results found for: "PHENYTOIN", "PHENOBARB", "VALPROATE", "CBMZ"   01/17/22 labs including TSH, B12, CMP, CBC, D all normal  03/07/23 vit D 48 on 50K weekly., normal TSH  .res Assessment: Plan:    Sarah Welch was seen today for follow-up.  Diagnoses and all orders for this visit:  Major depressive disorder with single episode, in partial remission (HCC) -     DULoxetine (CYMBALTA) 30 MG capsule; Take 1 capsule (30 mg total) by mouth 2 (two) times daily.  Panic disorder without agoraphobia -     DULoxetine (CYMBALTA) 30 MG capsule; Take 1 capsule (30 mg total) by mouth 2 (two) times daily.  Generalized anxiety disorder -     DULoxetine (CYMBALTA) 30 MG capsule; Take 1 capsule (30 mg total) by mouth 2 (two) times daily.  Fatigue due to depression  Insomnia due to mental condition  Low vitamin D level -     Vitamin D, Ergocalciferol, (DRISDOL) 1.25 MG (50000 UNIT) CAPS capsule; Take 1 capsule (50,000 Units total) by mouth every 7 (seven) days.  Anxiety -     diazepam (VALIUM) 5 MG tablet; TAKE ONE TO ONE AND A HALF TABLETS BY MOUTH TWICE DAILY AS NEEDED  Panic attack as reaction to stress -     diazepam (VALIUM) 5 MG tablet; TAKE ONE TO ONE AND A HALF TABLETS BY MOUTH TWICE DAILY AS NEEDED    30 min face to face time with patient was spent on counseling and coordination of care. We discussed Jurnee has the diagnoses noted above  and was not well controlled on Wellbutrin and Lexapro.  Her symptoms of depression and anxiety are better with the switch to duloxetine 30 mg twice daily.  No panic now. Also dx PD without med changes  previously tends to have some dizziness if she takes  duloxetine 60 mg at once.  Otherwise she is tolerating the current dosage.  She has had near resolution  of depression and resolution of the irritability.  She still has some episodic minor anxiety which is overall improved.  She is functioning better and more active.  She is satisfied with the med response at this time. Continue current duloxetine 30 BID,   Doubt bipolar but described sx and call if gets manic  Discussed options for fatigue which is better.  Recommend she restart vitamin D.  She asked for the once weekly supplement. Modafinil can be used off label for fatigue and people with chronic illnesses such as Parkinson's disease.  This may be helpful.  Discussed side effects in detail Stopped Modafinil 200 mg every morning prn didn't seem to help much.  Option retry up to 400 mg bc primary problem DT tiredness.  trazodone 50-100 mg if needed for sleep  Disc SE in detail and SSRI withdrawal sx.  If it fails call back  She stopped sertraline 50 mg daily for panic and anxiety in Dec early It is uncommon to use an SSRI and an SNRI together.  However her depression has not responded to pure SSRIs in the past.  She has had some improvement in depression with the SNRI duloxetine but cannot tolerate a higher dose.  We want to avoid augmentation with partial dopamine agonist or any other antipsychotic due to Parkinson's disease.  Therefore the most efficient and effective way to reduce her anxiety which is her chief complaint would be the addition of a low-dose SSRI to be combined with the duloxetine.  There is a low risk of serotonin syndrome because the dose of the sertraline will be low  We discussed the short-term risks associated with  benzodiazepines including sedation and increased fall risk among others.  Discussed long-term side effect risk including dependence, potential withdrawal symptoms, and the potential eventual dose-related risk of dementia.  But recent studies from 2020 dispute this association between benzodiazepines and dementia risk. Newer studies in 2020 do not support an association with dementia. Rarely takes BZ   FIDGETY ? REL0ATED STOPPING ROPINIROLE BUT NOT UNMANAGEABLE.  Not uncomfortable and better than when seen last  Continue vit s D, B, add MVI for fatiuge.    Plan:  duloxetine 30 BID, rare diazepam 5 mg , trazodone 100 mg HS.  No other psych meds. Continue vit D 50 K producing midrange level  Follow-up 6 mos  Call if you have any additional symptoms or problems with the medication.  Meredith Staggers MD, DFAPA  Please see After Visit Summary for patient specific instructions.  Future Appointments  Date Time Provider Department Center  05/02/2023  1:00 PM Tat, Octaviano Batty, DO [facility] None    No orders of the defined types were placed in this encounter.    -------------------------------

## 2023-05-01 NOTE — Progress Notes (Unsigned)
Assessment/Plan:   1.  Parkinsons Disease  -continue carbidopa/levodopa 25/100, 2 po tid-qid             -Continue carbidopa/levodopa 50/200 at bedtime.             -continue amantadine100 mg tid.  She has some livedo reticularis but would rather deal with that than they dyskinesia.  She tried to decrease it but didn't like the excess dyskinesia.  May consider gocovri in future     2.  Restless leg             -Continue carbidopa/levodopa 50/200 CR at bedtime   3.  GAD/MDD             -Under the care of Dr. Jennelle Human.  She is currently being treated with duloxetine,  Valium, trazodone  4.   Weight loss  -stable today but still quite thin compared to prior baseline  -doubt Parkinsons Disease related.  Likely related to anxiety  5.  Lightheadedness  -discussed increased water  -discussed that valium, levodopa and Parkinsons Disease all may be decreasing her BP  Subjective:   Sarah Welch was seen today in follow up for Parkinsons disease.  My previous records were reviewed prior to todays visit as well as outside records available to me.  I have not seen the patient in 9 months.  She has not had falls.  She is continuing to follow with psychiatry.  She saw Dr. Jennelle Human on August 7 and those notes are reviewed.  Current prescribed movement disorder medications:  Carbidopa/levodopa 25/100, 2 po tid-qid (she is getting in 8 per day but only took 6 when on vacation) Carbidopa/levodopa 50/200 q hs  Amantadine 100 mg 3 times per day  Requip, 1 mg 3 times daily (started last visit)  PREVIOUS MEDICATIONS:  citalopram, Lexapro; Wellbutrin; azilect (no help); pramipexole (swelling, nausea); neupro (states too expensive); ropinrole (not sure it helped but only at starting dose when she d/c)  ALLERGIES:   Allergies  Allergen Reactions   Ciprofloxacin Other (See Comments)    SEVERE JOINT PAIN Other reaction(s): Unknown   Pramipexole Other (See Comments)    Could not get up for 24  hours   Oxycodone-Acetaminophen     Other reaction(s): Unknown    CURRENT MEDICATIONS:  No outpatient medications have been marked as taking for the 05/02/23 encounter (Appointment) with Kaelah Hayashi, Octaviano Batty, DO.     Objective:   PHYSICAL EXAMINATION:    VITALS:   There were no vitals filed for this visit.   GEN:  The patient appears stated age and is in NAD. HEENT:  Normocephalic, atraumatic.  The mucous membranes are moist. The superficial temporal arteries are without ropiness or tenderness.  Neurological examination:  Orientation: The patient is alert and oriented x3. Cranial nerves: There is good facial symmetry without facial hypomimia. The speech is fluent and clear. Soft palate rises symmetrically and there is no tongue deviation. Hearing is intact to conversational tone. Sensation: Sensation is intact to light touch throughout Motor: Strength is at least antigravity x4.  Movement examination: Tone: There is nl tone in the UE/LE Abnormal movements: there is LLE dyskinesia (same as previous) Coordination:  There is min decremation with RAM's, with any form of RAMS, including alternating supination and pronation of the forearm, hand opening and closing, finger taps, heel taps and toe taps. Gait and Station: The patient has no difficulty arising out of a deep-seated chair without the use of the hands.  The patient's stride length is good with arm swing bilaterally  I have reviewed and interpreted the following labs independently    Chemistry      Component Value Date/Time   NA 137 03/07/2023 1112   NA 141 08/14/2019 0828   K 4.4 03/07/2023 1112   CL 100 03/07/2023 1112   CO2 30 03/07/2023 1112   BUN 8 03/07/2023 1112   BUN 13 08/14/2019 0828   CREATININE 0.76 03/07/2023 1112   CREATININE 0.77 12/28/2020 1406      Component Value Date/Time   CALCIUM 9.2 03/07/2023 1112   ALKPHOS 60 03/07/2023 1112   AST 14 03/07/2023 1112   ALT 5 03/07/2023 1112   BILITOT 0.6  03/07/2023 1112   BILITOT 0.6 08/14/2019 0828       Lab Results  Component Value Date   WBC 6.3 03/07/2023   HGB 13.7 03/07/2023   HCT 41.1 03/07/2023   MCV 92.4 03/07/2023   PLT 219.0 03/07/2023    Lab Results  Component Value Date   TSH 0.86 03/07/2023   Total time spent on today's visit was *** minutes, including both face-to-face time and nonface-to-face time.  Time included that spent on review of records (prior notes available to me/labs/imaging if pertinent), discussing treatment and goals, answering patient's questions and coordinating care.    Cc:  Gerre Scull, NP

## 2023-05-02 ENCOUNTER — Ambulatory Visit: Payer: Federal, State, Local not specified - PPO | Admitting: Neurology

## 2023-05-02 ENCOUNTER — Encounter: Payer: Self-pay | Admitting: Neurology

## 2023-05-02 VITALS — BP 124/62 | HR 77 | Ht 63.0 in | Wt 154.2 lb

## 2023-05-02 DIAGNOSIS — G20B2 Parkinson's disease with dyskinesia, with fluctuations: Secondary | ICD-10-CM | POA: Diagnosis not present

## 2023-05-02 DIAGNOSIS — Z1231 Encounter for screening mammogram for malignant neoplasm of breast: Secondary | ICD-10-CM | POA: Diagnosis not present

## 2023-05-02 LAB — HM MAMMOGRAPHY

## 2023-05-02 NOTE — Patient Instructions (Addendum)
We are going to start inbrija.  Remember that TWO capsules is ONE dosage (never inhale just one capsule).  You can inhale the capsules as needed up to 5 times per day, separated by 2 hour intervals.  Many patients use this right when the wake up to help with first morning "on" and then as needed during the day.  You should take a sip of water prior to using the inhaler to avoid side effects.  It may generate some cough right when you use it and that is normal.  There are nurse educators available to help you with this device.  You can call [phone] and they will set you up with a nurse educator to assist you for free of charge.  They are available 8am-8pm Monday-Friday.  SAVE THE DATE!  We are planning a Parkinsons Disease educational symposium at [facility] in [place] on October 11.  More details to come!  If you would like to be added to our email list to get further information, email [email or web address].  We hope to see you there!

## 2023-05-03 ENCOUNTER — Encounter: Payer: Self-pay | Admitting: Nurse Practitioner

## 2023-05-09 ENCOUNTER — Other Ambulatory Visit: Payer: Self-pay | Admitting: Psychiatry

## 2023-05-09 DIAGNOSIS — F5105 Insomnia due to other mental disorder: Secondary | ICD-10-CM

## 2023-05-12 DIAGNOSIS — R030 Elevated blood-pressure reading, without diagnosis of hypertension: Secondary | ICD-10-CM | POA: Diagnosis not present

## 2023-05-12 DIAGNOSIS — J0110 Acute frontal sinusitis, unspecified: Secondary | ICD-10-CM | POA: Diagnosis not present

## 2023-06-14 ENCOUNTER — Other Ambulatory Visit: Payer: Self-pay | Admitting: Neurology

## 2023-06-14 DIAGNOSIS — G20A1 Parkinson's disease without dyskinesia, without mention of fluctuations: Secondary | ICD-10-CM

## 2023-06-14 MED ORDER — AMANTADINE HCL 100 MG PO CAPS: 100.0000 mg | ORAL_CAPSULE | Freq: Three times a day (TID) | ORAL | 0 refills | Status: DC

## 2023-07-13 ENCOUNTER — Encounter: Payer: Self-pay | Admitting: Nurse Practitioner

## 2023-07-13 ENCOUNTER — Ambulatory Visit: Payer: Federal, State, Local not specified - PPO | Admitting: Nurse Practitioner

## 2023-07-13 ENCOUNTER — Ambulatory Visit (HOSPITAL_BASED_OUTPATIENT_CLINIC_OR_DEPARTMENT_OTHER)
Admission: RE | Admit: 2023-07-13 | Discharge: 2023-07-13 | Disposition: A | Discharge status: Home / Self Care | Payer: Federal, State, Local not specified - PPO | Source: Ambulatory Visit | Attending: Nurse Practitioner | Admitting: Nurse Practitioner

## 2023-07-13 VITALS — BP 118/72 | HR 89 | Temp 97.0°F | Ht 63.0 in | Wt 153.2 lb

## 2023-07-13 DIAGNOSIS — M5412 Radiculopathy, cervical region: Secondary | ICD-10-CM | POA: Diagnosis not present

## 2023-07-13 DIAGNOSIS — M47812 Spondylosis without myelopathy or radiculopathy, cervical region: Secondary | ICD-10-CM | POA: Diagnosis not present

## 2023-07-13 DIAGNOSIS — M545 Low back pain, unspecified: Secondary | ICD-10-CM | POA: Insufficient documentation

## 2023-07-13 DIAGNOSIS — M50223 Other cervical disc displacement at C6-C7 level: Secondary | ICD-10-CM | POA: Diagnosis not present

## 2023-07-13 DIAGNOSIS — G8929 Other chronic pain: Secondary | ICD-10-CM | POA: Diagnosis not present

## 2023-07-13 DIAGNOSIS — M542 Cervicalgia: Secondary | ICD-10-CM | POA: Diagnosis not present

## 2023-07-13 DIAGNOSIS — S3992XA Unspecified injury of lower back, initial encounter: Secondary | ICD-10-CM | POA: Diagnosis not present

## 2023-07-13 DIAGNOSIS — M5030 Other cervical disc degeneration, unspecified cervical region: Secondary | ICD-10-CM | POA: Diagnosis not present

## 2023-07-13 DIAGNOSIS — M4312 Spondylolisthesis, cervical region: Secondary | ICD-10-CM | POA: Diagnosis not present

## 2023-07-13 NOTE — Patient Instructions (Signed)
It was great to see you!  I have ordered x-rays of your neck and low back at [facility], first floor   You can also look into a chiropractor if interested.   Let's follow-up if symptoms worsen or any concerns.   Take care,  Rodman Pickle, NP

## 2023-07-13 NOTE — Assessment & Plan Note (Signed)
Severe and persistent neck pain lasting two weeks, radiating down the shoulder blades, has resolved without a clear precipitating event. Despite the lack of relief from over-the-counter medications and muscle relaxants, there is concern for recurrence. We will order a cervical spine x-ray and consider an MRI if the pain recurs or if x-ray findings are concerning.

## 2023-07-13 NOTE — Progress Notes (Signed)
Acute Office Visit  Subjective:     Patient ID: Sarah Welch, female    DOB: 1970-12-03, 52 y.o.   MRN: [id number]  Chief Complaint  Patient presents with   Neck and Back Pain    Neck and Lower back pain     HPI Patient is in today for neck and back pain.   Discussed the use of AI scribe software for clinical note transcription with the patient, who gave verbal consent to proceed.  History of Present Illness   The patient, with a history of muscle cramps, presents with a chief complaint of severe neck pain that lasted for two weeks and has since resolved. The pain was so severe that the patient was unable to turn their head and experienced weakness in their arms. The patient describes the pain as muscular, with palpable knots, and radiating down to the shoulder blades. The patient also reports intermittent lower back pain, similar to a previous episode after slipping on stairs. Over-the-counter medications, creams, gels, and a muscle relaxer provided no relief. The patient also mentions a history of muscle cramps, which have improved with magnesium supplementation and grounding.      ROS See pertinent positives and negatives per HPI.     Objective:    BP 118/72 (BP Location: Left Arm)   Pulse 89   Temp (!) 97 F (36.1 C)   Ht 5\' 3"  (1.6 m)   Wt 153 lb 3.2 oz (69.5 kg)   LMP 01/14/2016 (Exact Date)   SpO2 98%   BMI 27.14 kg/m    Physical Exam Vitals and nursing note reviewed.  Constitutional:      General: She is not in acute distress.    Appearance: Normal appearance.  HENT:     Head: Normocephalic.  Eyes:     Conjunctiva/sclera: Conjunctivae normal.  Cardiovascular:     Rate and Rhythm: Normal rate and regular rhythm.     Pulses: Normal pulses.     Heart sounds: Normal heart sounds.  Pulmonary:     Effort: Pulmonary effort is normal.     Breath sounds: Normal breath sounds.  Musculoskeletal:        General: No tenderness. Normal range of motion.      Cervical back: Normal range of motion.  Skin:    General: Skin is warm.  Neurological:     General: No focal deficit present.     Mental Status: She is alert and oriented to person, place, and time.  Psychiatric:        Mood and Affect: Mood normal.        Behavior: Behavior normal.        Thought Content: Thought content normal.        Judgment: Judgment normal.     No results found for any visits on 07/13/23.      Assessment & Plan:   Problem List Items Addressed This Visit       Nervous and Auditory   Cervical radiculopathy    Severe and persistent neck pain lasting two weeks, radiating down the shoulder blades, has resolved without a clear precipitating event. Despite the lack of relief from over-the-counter medications and muscle relaxants, there is concern for recurrence. We will order a cervical spine x-ray and consider an MRI if the pain recurs or if x-ray findings are concerning.      Relevant Orders   DG Cervical Spine Complete     Other   Chronic midline low back  pain without sciatica - Primary    They present with mild, intermittent lower back pain, reminiscent of a previous episode following a fall, without pain radiation down the legs. We will order a lumbar spine x-ray.       Relevant Orders   DG Lumbar Spine Complete    No orders of the defined types were placed in this encounter.   Return if symptoms worsen or fail to improve.  Gerre Scull, NP

## 2023-07-13 NOTE — Assessment & Plan Note (Signed)
They present with mild, intermittent lower back pain, reminiscent of a previous episode following a fall, without pain radiation down the legs. We will order a lumbar spine x-ray.

## 2023-07-31 ENCOUNTER — Other Ambulatory Visit: Payer: Self-pay | Admitting: Neurology

## 2023-07-31 DIAGNOSIS — G20A1 Parkinson's disease without dyskinesia, without mention of fluctuations: Secondary | ICD-10-CM

## 2023-08-04 ENCOUNTER — Other Ambulatory Visit: Payer: Self-pay

## 2023-08-04 MED ORDER — TIZANIDINE HCL 4 MG PO TABS: 4.0000 mg | ORAL_TABLET | Freq: Three times a day (TID) | ORAL | 1 refills | Status: DC | PRN

## 2023-08-04 NOTE — Telephone Encounter (Signed)
Refill request for  Tizanidine HCL 4 gm LR  03/08/23, #30, 1 rf LOV  07/13/23 FOV   none scheduled.   Please review and advise.  Thanks. Dm/cma

## 2023-08-24 ENCOUNTER — Other Ambulatory Visit: Payer: Self-pay | Admitting: Neurology

## 2023-08-24 DIAGNOSIS — G20B2 Parkinson's disease with dyskinesia, with fluctuations: Secondary | ICD-10-CM

## 2023-08-24 DIAGNOSIS — G20A1 Parkinson's disease without dyskinesia, without mention of fluctuations: Secondary | ICD-10-CM

## 2023-08-25 MED ORDER — CARBIDOPA-LEVODOPA ER 50-200 MG PO TBCR: 1.0000 | EXTENDED_RELEASE_TABLET | Freq: Every day | ORAL | 0 refills | Status: DC

## 2023-09-27 NOTE — Progress Notes (Signed)
53 y.o. G46P1011 Married Caucasian female here for annual exam.    Followed for vaginal atrophy.  Using vaginal cream and it runs back out.   Forgets to use it.   Has a new job and gained weight back that she lost due to stress.   Wants to get back to working out again.   Some general fatigue.  Has Parkinson's.  PCP: Gerre Scull, NP   Patient's last menstrual period was 01/14/2016 (exact date).   Age 53.        Sexually active: Yes.    The current method of family planning is post menopausal status.    Menopausal hormone therapy:  estrace Exercising: No.   Smoker:  no  OB History  Gravida Para Term Preterm AB Living  2 1 1  1 1   SAB IAB Ectopic Multiple Live Births  1    1    # Outcome Date GA Lbr Len/2nd Weight Sex Type Anes PTL Lv  2 Term 1994 [redacted]w[redacted]d   F Vag-Spont   LIV  1 SAB 1992             HEALTH MAINTENANCE: Last 2 paps:  03/30/18 neg: HR HPV neg, 04/02/15 neg History of abnormal Pap or positive HPV:  yes, hx of cryo Mammogram:   05/02/23 at [place].   Colonoscopy:  10/11/19 due in 2026 due to a polyp. Bone Density:  n/a  Result  n/a   Immunization History  Administered Date(s) Administered   Influenza,inj,Quad PF,6+ Mos 06/02/2017   Influenza-Unspecified 06/22/2016, 06/02/2017   Tdap 09/20/2007, 08/02/2022      reports that she quit smoking about 7 years ago. Her smoking use included cigarettes. She started smoking about 32 years ago. She has a 12.5 pack-year smoking history. She has never used smokeless tobacco. She reports that she does not drink alcohol and does not use drugs.  Past Medical History:  Diagnosis Date   Abnormal Pap smear of cervix 1990   unsure abnormality, colpo OK--hx of cryotherapy to cervix   Parkinson disease (HCC) 12/15    Past Surgical History:  Procedure Laterality Date   AUGMENTATION MAMMAPLASTY Bilateral 2004   implants, saline   COLPOSCOPY     DILATION AND CURETTAGE OF UTERUS  1992   following SAB    Current  Outpatient Medications  Medication Sig Dispense Refill   amantadine (SYMMETREL) 100 MG capsule TAKE ONE CAPSULE BY MOUTH THREE TIMES A DAY 270 capsule 0   amantadine (SYMMETREL) 100 MG capsule Take 1 capsule (100 mg total) by mouth 3 (three) times daily. 270 capsule 0   amantadine (SYMMETREL) 100 MG capsule TAKE ONE CAPSULE BY MOUTH THREE TIMES A DAY 270 capsule 0   carbidopa-levodopa (SINEMET CR) 50-200 MG tablet TAKE ONE TABLET BY MOUTH AT BEDTIME 90 tablet 0   carbidopa-levodopa (SINEMET CR) 50-200 MG tablet Take 1 tablet by mouth at bedtime. 90 tablet 0   carbidopa-levodopa (SINEMET IR) 25-100 MG tablet TAKE TWO TABLETS BY MOUTH ONE TIME DAILY AS DIRECTED. MAY TAKE UP TO 8 TABLETS PER DAY 720 tablet 0   diazepam (VALIUM) 5 MG tablet TAKE ONE TO ONE AND A HALF TABLETS BY MOUTH TWICE DAILY AS NEEDED 60 tablet 1   DULoxetine (CYMBALTA) 30 MG capsule Take 1 capsule (30 mg total) by mouth 2 (two) times daily. 180 capsule 1   estradiol (ESTRACE) 0.1 MG/GM vaginal cream Use 1/2 g vaginally two or three times per week as needed to maintain symptom  relief. 42.5 g 2   tiZANidine (ZANAFLEX) 4 MG tablet Take 1 tablet (4 mg total) by mouth every 8 (eight) hours as needed for muscle spasms. 30 tablet 1   traZODone (DESYREL) 50 MG tablet TAKE ONE TO TWO TABLETS BY MOUTH AT BEDTIME 180 tablet 0   Vitamin D, Ergocalciferol, (DRISDOL) 1.25 MG (50000 UNIT) CAPS capsule Take 1 capsule (50,000 Units total) by mouth every 7 (seven) days. 15 capsule 3   No current facility-administered medications for this visit.    ALLERGIES: Ciprofloxacin, Pramipexole, and Oxycodone-acetaminophen  Family History  Problem Relation Age of Onset   Heart disease Mother    Alcohol abuse Father    Healthy Sister    Diabetes Maternal Grandmother    Hypertension Maternal Grandmother    Diabetes Maternal Grandfather    Hypertension Maternal Grandfather    Stroke Paternal Grandmother    Heart attack Paternal Grandfather     Healthy Daughter     Review of Systems  All other systems reviewed and are negative.   PHYSICAL EXAM:  BP 128/82 (BP Location: Left Arm, Patient Position: Sitting, Cuff Size: Small)   Pulse 90   Ht 5\' 6"  (1.676 m)   Wt 159 lb (72.1 kg)   LMP 01/14/2016 (Exact Date)   SpO2 98%   BMI 25.66 kg/m     General appearance: alert, cooperative and appears stated age Head: normocephalic, without obvious abnormality, atraumatic Neck: no adenopathy, supple, symmetrical, trachea midline and thyroid normal to inspection and palpation Lungs: clear to auscultation bilaterally Breasts: normal appearance, consistent with bilateral augmentation, no masses or tenderness, No nipple retraction or dimpling, No nipple discharge or bleeding, No axillary adenopathy Heart: regular rate and rhythm Abdomen: soft, non-tender; no masses, no organomegaly Extremities: extremities normal, atraumatic, no cyanosis or edema Skin: skin color, texture, turgor normal. No rashes or lesions Lymph nodes: cervical, supraclavicular, and axillary nodes normal. Neurologic: grossly normal  Pelvic: External genitalia:  no lesions              No abnormal inguinal nodes palpated.              Urethra:  normal appearing urethra with no masses, tenderness or lesions              Bartholins and Skenes: normal                 Vagina: normal appearing vagina with normal color and discharge, no lesions              Cervix: no lesions              Pap taken: yes Bimanual Exam:  Uterus:  normal size, contour, position, consistency, mobility, non-tender              Adnexa: no mass, fullness, tenderness              Rectal exam: yes.  Confirms.              Anus:  normal sphincter tone, no lesions  Chaperone was present for exam:  Warren Lacy, CMA  ASSESSMENT: Well woman visit with gynecologic exam Status post breast augmentation.  Remote history of cryotherapy to cervix, 1990. Vaginal atrophy.  Parkinson's  Disease.  PLAN: Mammogram screening discussed.  Will get report from [facility] for 2024.  Self breast awareness reviewed. Pap and HRV collected:  yes Guidelines for Calcium, Vitamin D, regular exercise program including cardiovascular and weight bearing exercise. Medication refills:  Stop  vaginal estradiol cream and start vagifem tablets.  Instructed in use.  I discussed potential effect on breast cancer.  Follow up:  yearly and prn.

## 2023-10-02 ENCOUNTER — Other Ambulatory Visit: Payer: Self-pay | Admitting: Neurology

## 2023-10-02 DIAGNOSIS — G20A1 Parkinson's disease without dyskinesia, without mention of fluctuations: Secondary | ICD-10-CM

## 2023-10-02 DIAGNOSIS — R259 Unspecified abnormal involuntary movements: Secondary | ICD-10-CM

## 2023-10-10 ENCOUNTER — Ambulatory Visit: Payer: Federal, State, Local not specified - PPO | Admitting: Obstetrics and Gynecology

## 2023-10-11 ENCOUNTER — Encounter: Payer: Self-pay | Admitting: Obstetrics and Gynecology

## 2023-10-11 ENCOUNTER — Ambulatory Visit (INDEPENDENT_AMBULATORY_CARE_PROVIDER_SITE_OTHER): Payer: Federal, State, Local not specified - PPO | Admitting: Obstetrics and Gynecology

## 2023-10-11 ENCOUNTER — Other Ambulatory Visit (HOSPITAL_COMMUNITY)
Admission: RE | Admit: 2023-10-11 | Discharge: 2023-10-11 | Disposition: A | Discharge status: Home / Self Care | Payer: Federal, State, Local not specified - PPO | Source: Ambulatory Visit | Attending: Obstetrics and Gynecology | Admitting: Obstetrics and Gynecology

## 2023-10-11 VITALS — BP 128/82 | HR 90 | Ht 66.0 in | Wt 159.0 lb

## 2023-10-11 DIAGNOSIS — N952 Postmenopausal atrophic vaginitis: Secondary | ICD-10-CM | POA: Diagnosis not present

## 2023-10-11 DIAGNOSIS — Z124 Encounter for screening for malignant neoplasm of cervix: Secondary | ICD-10-CM

## 2023-10-11 DIAGNOSIS — Z5181 Encounter for therapeutic drug level monitoring: Secondary | ICD-10-CM

## 2023-10-11 DIAGNOSIS — Z01419 Encounter for gynecological examination (general) (routine) without abnormal findings: Secondary | ICD-10-CM | POA: Diagnosis not present

## 2023-10-11 MED ORDER — ESTRADIOL 10 MCG VA TABS: 1.0000 | ORAL_TABLET | VAGINAL | 3 refills | Status: AC

## 2023-10-11 NOTE — Patient Instructions (Signed)

## 2023-10-12 ENCOUNTER — Encounter: Payer: Self-pay | Admitting: Obstetrics and Gynecology

## 2023-10-13 IMAGING — DX DG CHEST 2V
2 series · 2 of 2 positions shown · non-contrast
Comparison: None Available.

CLINICAL DATA: Cough for several months

EXAM:
CHEST - 2 VIEW

[chest pa]
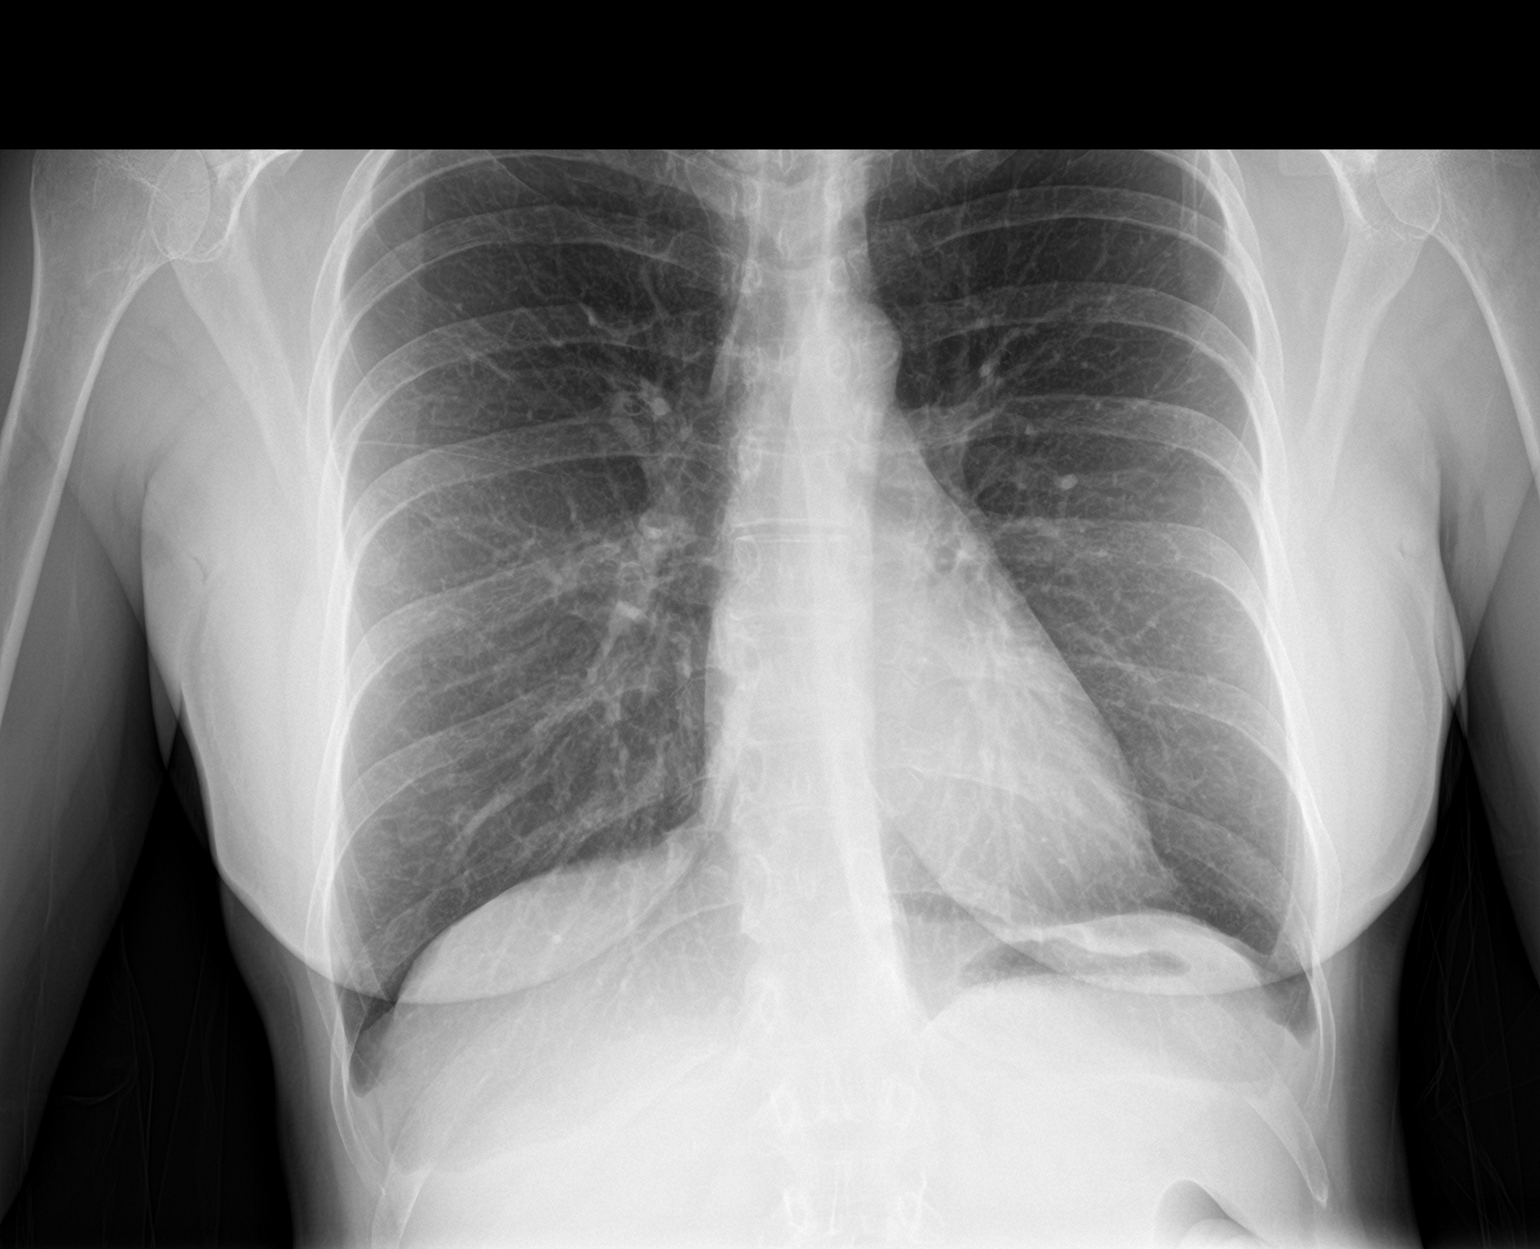

[chest lat]
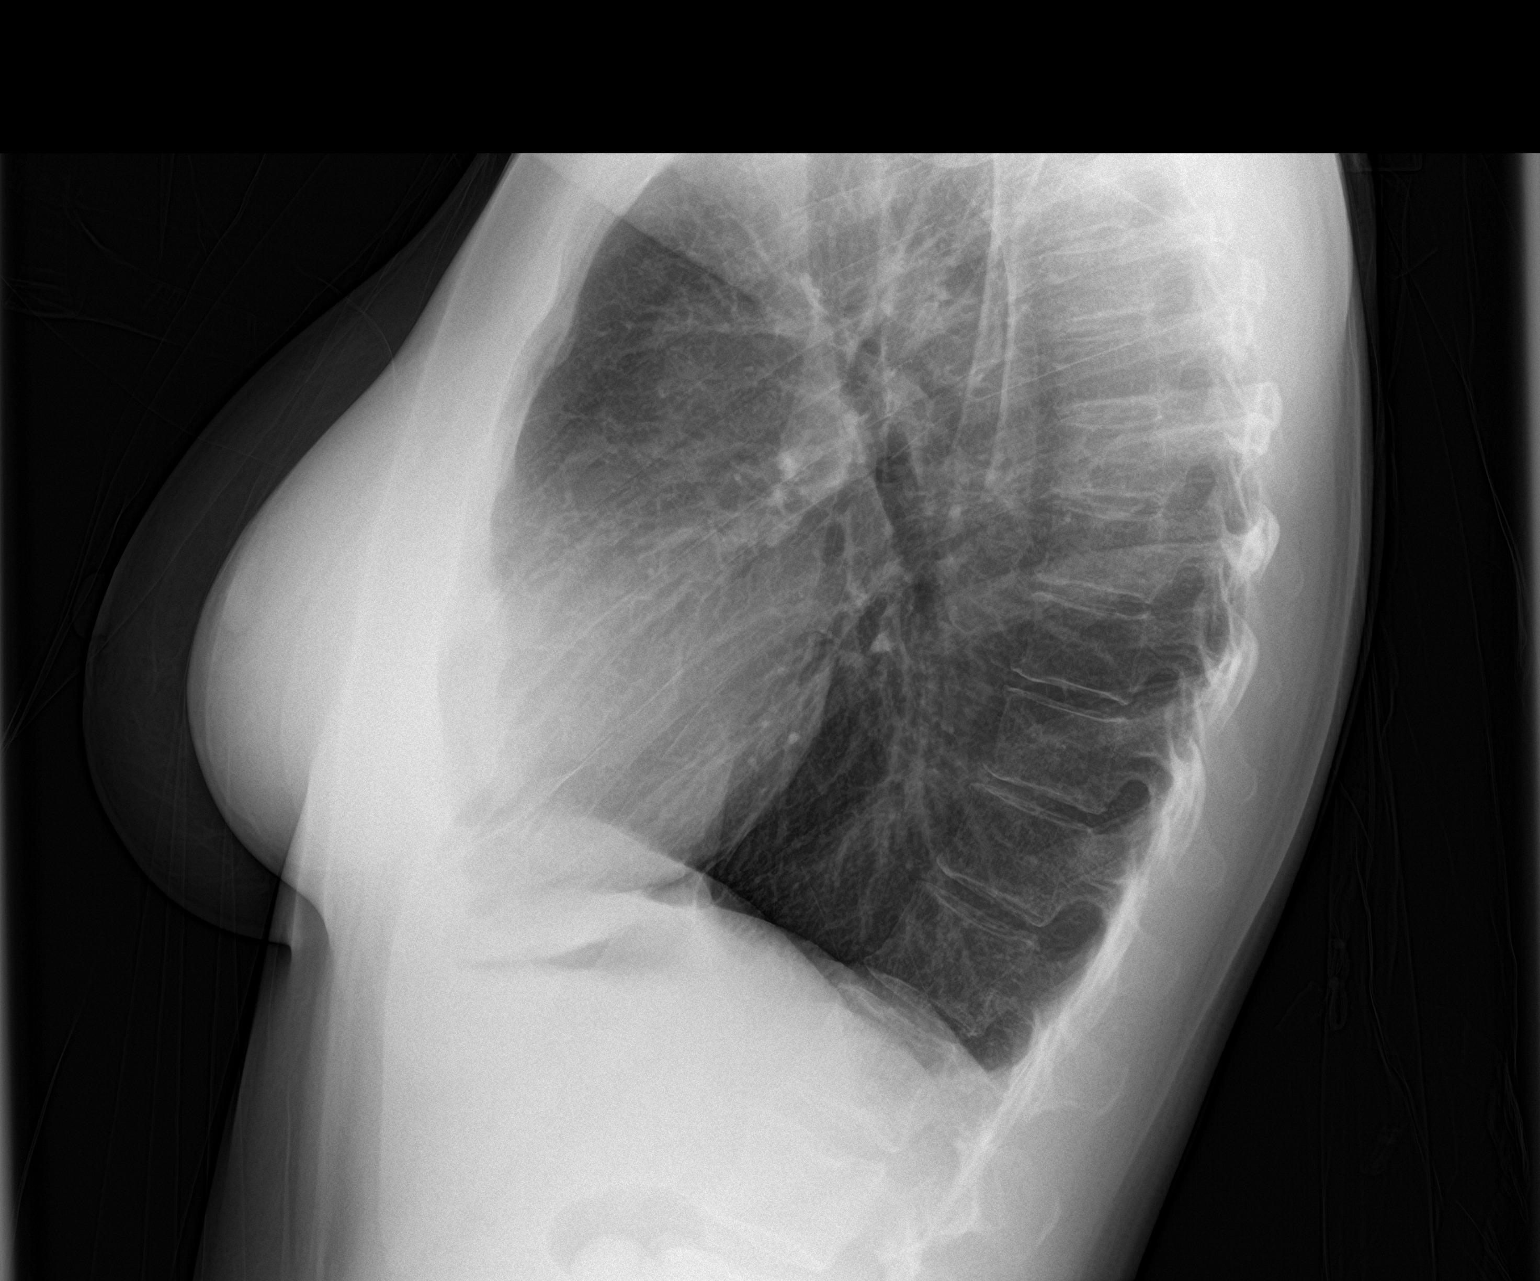

[2 of 2 positions shown; findings below may reference images not displayed]

FINDINGS: The heart size and mediastinal contours are within normal limits.
Both lungs are clear. The visualized skeletal structures are
unremarkable.
IMPRESSION: No active cardiopulmonary disease.

## 2023-10-16 ENCOUNTER — Encounter: Payer: Self-pay | Admitting: Obstetrics and Gynecology

## 2023-10-16 LAB — CYTOLOGY - PAP
Adequacy: ABSENT
Comment: NEGATIVE
Diagnosis: NEGATIVE
High risk HPV: NEGATIVE

## 2023-10-18 DIAGNOSIS — H6993 Unspecified Eustachian tube disorder, bilateral: Secondary | ICD-10-CM | POA: Diagnosis not present

## 2023-10-18 DIAGNOSIS — R509 Fever, unspecified: Secondary | ICD-10-CM | POA: Diagnosis not present

## 2023-10-18 DIAGNOSIS — R070 Pain in throat: Secondary | ICD-10-CM | POA: Diagnosis not present

## 2023-10-18 DIAGNOSIS — J101 Influenza due to other identified influenza virus with other respiratory manifestations: Secondary | ICD-10-CM | POA: Diagnosis not present

## 2023-10-31 ENCOUNTER — Other Ambulatory Visit: Payer: Self-pay | Admitting: Psychiatry

## 2023-10-31 DIAGNOSIS — F324 Major depressive disorder, single episode, in partial remission: Secondary | ICD-10-CM

## 2023-10-31 DIAGNOSIS — F410 Panic disorder [episodic paroxysmal anxiety] without agoraphobia: Secondary | ICD-10-CM

## 2023-10-31 DIAGNOSIS — F411 Generalized anxiety disorder: Secondary | ICD-10-CM

## 2023-11-01 ENCOUNTER — Ambulatory Visit: Payer: Federal, State, Local not specified - PPO | Admitting: Psychiatry

## 2023-11-01 NOTE — Progress Notes (Unsigned)
Assessment/Plan:   1.  Parkinsons Disease  -continue carbidopa/levodopa 25/100, 2 po tid-qid             -Continue carbidopa/levodopa 50/200 at bedtime.             -continue amantadine100 mg tid.  She has some livedo reticularis but would rather deal with that than they dyskinesia.  She tried to decrease it but didn't like the excess dyskinesia.  May consider gocovri in future  -***Inbrija  -Discussed COMT inhibitors but she decided to hold off on those for now.   2.  Restless leg             -Continue carbidopa/levodopa 50/200 CR at bedtime   3.  GAD/MDD             -Under the care of Dr. Jennelle Human.  She is currently being treated with duloxetine,  Valium, trazodone    Subjective:   Sarah Welch was seen today in follow up for Parkinsons disease.  My previous records were reviewed prior to todays visit as well as outside records available to me.  I have not seen the patient in 9 months.  She has not had falls.  Inbrija was started last visit and she reports today that ***  Current prescribed movement disorder medications:  Carbidopa/levodopa 25/100, 2 po qid (6:30am/10am/1pm/4pm) Carbidopa/levodopa 50/200 q hs  Amantadine 100 mg 3 times per day  Requip, 1 mg 3 times daily  Inbrija (samples given last visit)  PREVIOUS MEDICATIONS:  citalopram, Lexapro; Wellbutrin; azilect (no help); pramipexole (swelling, nausea); neupro (states too expensive); ropinrole (not sure it helped but only at starting dose when she d/c)  ALLERGIES:   Allergies  Allergen Reactions   Ciprofloxacin Other (See Comments)    SEVERE JOINT PAIN Other reaction(s): Unknown   Pramipexole Other (See Comments)    Could not get up for 24 hours   Oxycodone-Acetaminophen     Other reaction(s): Unknown    CURRENT MEDICATIONS:  No outpatient medications have been marked as taking for the 11/03/23 encounter (Appointment) with Rayetta Veith, Octaviano Batty, DO.     Objective:   PHYSICAL EXAMINATION:    VITALS:    There were no vitals filed for this visit.  Wt Readings from Last 3 Encounters:  10/11/23 159 lb (72.1 kg)  07/13/23 153 lb 3.2 oz (69.5 kg)  05/02/23 154 lb 3.2 oz (69.9 kg)    GEN:  The patient appears stated age and is in NAD. HEENT:  Normocephalic, atraumatic.  The mucous membranes are moist. The superficial temporal arteries are without ropiness or tenderness.  Neurological examination:  Orientation: The patient is alert and oriented x3. Cranial nerves: There is good facial symmetry without facial hypomimia. The speech is fluent and clear. Soft palate rises symmetrically and there is no tongue deviation. Hearing is intact to conversational tone. Sensation: Sensation is intact to light touch throughout Motor: Strength is at least antigravity x4.  Movement examination: Tone: There is nl tone in the UE/LE Abnormal movements: there is LLE dyskinesia (same as previous visits) Coordination:  There is no decremation today with any form of rapid alternating movements. Gait and Station: The patient has no difficulty arising out of a deep-seated chair without the use of the hands. The patient's stride length is good with arm swing bilaterally  I have reviewed and interpreted the following labs independently    Chemistry      Component Value Date/Time   NA 137 03/07/2023 1112  NA 141 08/14/2019 0828   K 4.4 03/07/2023 1112   CL 100 03/07/2023 1112   CO2 30 03/07/2023 1112   BUN 8 03/07/2023 1112   BUN 13 08/14/2019 0828   CREATININE 0.76 03/07/2023 1112   CREATININE 0.77 12/28/2020 1406      Component Value Date/Time   CALCIUM 9.2 03/07/2023 1112   ALKPHOS 60 03/07/2023 1112   AST 14 03/07/2023 1112   ALT 5 03/07/2023 1112   BILITOT 0.6 03/07/2023 1112   BILITOT 0.6 08/14/2019 0828       Lab Results  Component Value Date   WBC 6.3 03/07/2023   HGB 13.7 03/07/2023   HCT 41.1 03/07/2023   MCV 92.4 03/07/2023   PLT 219.0 03/07/2023    Lab Results  Component Value  Date   TSH 0.86 03/07/2023   Total time spent on today's visit was *** minutes, including both face-to-face time and nonface-to-face time.  Time included that spent on review of records (prior notes available to me/labs/imaging if pertinent), discussing treatment and goals, answering patient's questions and coordinating care.    Cc:  Gerre Scull, NP

## 2023-11-02 ENCOUNTER — Encounter: Payer: Self-pay | Admitting: Psychiatry

## 2023-11-02 ENCOUNTER — Ambulatory Visit: Payer: Federal, State, Local not specified - PPO | Admitting: Psychiatry

## 2023-11-02 DIAGNOSIS — R5383 Other fatigue: Secondary | ICD-10-CM

## 2023-11-02 DIAGNOSIS — F5105 Insomnia due to other mental disorder: Secondary | ICD-10-CM

## 2023-11-02 DIAGNOSIS — F411 Generalized anxiety disorder: Secondary | ICD-10-CM

## 2023-11-02 DIAGNOSIS — F32A Depression, unspecified: Secondary | ICD-10-CM | POA: Diagnosis not present

## 2023-11-02 DIAGNOSIS — F324 Major depressive disorder, single episode, in partial remission: Secondary | ICD-10-CM

## 2023-11-02 DIAGNOSIS — R7989 Other specified abnormal findings of blood chemistry: Secondary | ICD-10-CM

## 2023-11-02 DIAGNOSIS — F410 Panic disorder [episodic paroxysmal anxiety] without agoraphobia: Secondary | ICD-10-CM

## 2023-11-02 MED ORDER — TRAZODONE HCL 50 MG PO TABS: 50.0000 mg | ORAL_TABLET | Freq: Every day | ORAL | 1 refills | Status: DC

## 2023-11-02 MED ORDER — DULOXETINE HCL 30 MG PO CPEP: 30.0000 mg | ORAL_CAPSULE | Freq: Two times a day (BID) | ORAL | 1 refills | Status: DC

## 2023-11-02 NOTE — Patient Instructions (Addendum)
Modafinil 200 mg tablet 1 daily for 3 days then 1 and 1/2 tablets daily for 3-4 days and if no benefit then can increase to 2 each morning.

## 2023-11-02 NOTE — Progress Notes (Signed)
Sarah Welch [id number] [birth year] 53 y.o.  Subjective:   [name] is a 53 y.o. (DOB 02/13/1971) female.  Chief Complaint:  Chief Complaint  Patient presents with   Follow-up   Depression   Anxiety   Sleeping Problem   ADD    HPI Sarah Welch presents to the office today for follow-up of depression and anxiety.  01/2020 appt with the following noted and no med changes: Going good. Better motivated and she feelks better.  Occ anxiety spontaneous with diazepam helpful.  Overall anxiety level of anxiety is better and less depression.  Most improvement in the last 3 weeks with better activity.  Irritability resolved. Tolerating duloxetine prefers AM and noon.    05/18/2020 appt wit the following noted: Job still stressful but meds are fine.  Short staffed.  Stress eating.  Work out 3 times weekly and plans more.  Fears vaccine mandate will cause more employees to leave.   Diazepam once or twice weekly.  No panic.  11/16/2020 appointment with following noted: Still on duloxetine 30 BID and rare diazepam.  PD meds without change. Sleep problems for a couple of months.  EMA and initial insomnia. Caffeine before lunch 2 cups.  Some nights only 2 hours sleep. Depression might be a little worse, but tired of not sleeping.  Anxiety is OK.  Work is better.  Average 3 hours sleep per night. Plan: start trazodone 50 and continue duloxetine 30 BID  01/12/21 appt noted:  Trazodone partly helped.  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  Don't know where it's coming from.  Not like that in years.  H notices but not complaining.  Harder to get to sleep those days.  It's gone in the morning.  Going on for 3 weeks. No history of mania.  Plan: Continue current duloxetine 30 BID, try moving both doses back tomorning. trazodone can increase to 100 mg if needed for sleep  02/05/2021 urgent  appointment scheduled at patient request: Day after seen.  M died in her sleep.  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  Old dog is dying.  Straw broke camel's back was job stressor.  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  Sleep is OK.  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  Has told people she can't do it.    04/29/21 Average diazepam about 4 days per week. Continues duloxetine 30 BID, Trazodone 50-100 HS. No SE. Still dealing with estate things and work stress.  Got off 6 weeks from work but not STD.   Function at work seems OK.  Still gets anxiety attacks with SOB at work about once daily but fights through it.  Still short staffed at work. Sleep 10-530 usually. Not able to find much enjoyment usually.   Feels she is overextended and that makes it hard to relax into after hour activites.  Hope when estate is resolved things will get better. Plan: Continue current duloxetine 30 BID, try moving both doses back tomorning. trazodone can increase to 100 mg if needed for sleep  07/28/21 appt noted: Sleep all the time if can now.  So tired for a couple of weeks. Taking diazepam more rarely.  Some weeks none. No med changes. NO change PD meds. Don't know if depressed.  No reason.  Too tired  to enjoy things.  Not a lot of nervousness. 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  Energy better but not normal.   Patient reports stable mood and denies depressed or irritable moods.  Patient denies any recent difficulty with anxiety.  Patient denies difficulty with sleep initiation or maintenance.  Patient reports that energy and motivation have been good.  Patient denies any difficulty with concentration.  Patient denies any suicidal ideation. Modafinil 200 noticeable but hasn't needed it in awhile. Can get overwhelmed with  workload but otherwise anxiety ok Lost weight to 143# without trying.  Highest ever 160# Plan: no med changes  04/07/22 appt noted: Fine until a month ago anxiety attacks out of nowhere.  At [facility], started sobbing without reason or trigger other than being hot. Drove home.  Then got in a rage with trigger going home.  No accidents on incidents.  That was the worst but other episodes anxiety with hands tingling. No other rage episodes except quick temper as a kid.   OK the next day other than tired. Lost wt without trying.  Current wt about 130#.   Uses pill box and doesn't think she's missing any meds. Not generally more anxious usually.  Nor irrtiable. Job is still real stressful and PD worse in afternoon. Dep 6-7/10 Saturdays wants to shop usually but now getting anxious about it. Sx worse in last 5 weeks.  No trigger. Fri-Sunday EMA and crashed Monday.  EMA in the last week. Plan: Continue current duloxetine 30 BID, try moving both doses back tomorning. trazodone 50-100 mg if needed for sleep  Disc SE in detail and SSRI withdrawal sx.  If it fails call back Add sertraline 25- then 50 mg daily for panic and anxiety  06/22/22 appt noted: It's helped with less anxiety and 1-2 panic since here.  Tired all the time. Wt loss stopped 128#. Taking B complex and D.   Added Ropinirole BID for PD. Consistent with sertraline 50 for panic and duloxetine 60 mg daily.  Occ diazepam. Depression ok.  Dong alright.    10/26/22 appt noted: Good overall. Stopped sertraline  in Dec bc too much med.  Also stopped ropinirole. Felt fine bc too much meds. Switched jobs.  36 hours per week with good breaks. Didn't notice any difference good or bad. Tired constantly. Depression is a litle better.  Frustrated not getting things done like she would like.  Motivated but tired. Meds duloxetine 30 BID, rare diazepam 5 mg , trazodone 100 mg HS.  No other psych meds. Plan no changes  04/26/23 appt noted: Occ  trouble sleeping initial or terminal.  Usually 1030-530.  Seems a little better but occ of insomnia. Caffeine in the AM and then about 2 PM. Anxiety is hard to define at times bc has PD also causing problems.  Will get stiff with PD and it makes her anxious.  But will get anxious SOB without trigger too.  A little more anxious than 6 mos ago.   Mood including dep is about the same.  Worries PD is going to make her worse but sh has done ok with it so far.   No SE known. Plan no changes  11/02/23 appt noted: Meds as above Doing pretty good without complaints.  Not usually dep.   Some trouble finishing tasks at times. More so just in recent time.  Like finishing tables.  Not finished painting walls.  Not due to dep. Worse in last couple of year. Can read interesting articles.   Anxiety ok usually. PD not much change.  Doing pretty well it.  Dx 07/2014.   H doing well. Chronic tiredness.  Sleep pretty good.  Usually 7-8 hours.     Exercises with PD group and feels good physically. No family history of bipolar.  Past Psychiatric History:  No psychiatrist or counselor. Past Psychiatric Medication Trials: Celexa, Lexapro, Wellbutrin SE, duloxetine 30 BID Sertraline 50 helped anxiety Diazepam Remote Ambien Modafinil 200 little effect  Review of Systems:  Review of Systems  Cardiovascular: Negative for chest pain and palpitations.  Neurological: Positive for tremors. Negative for dizziness and weakness.  Ongoing fatigue unchanged  Medications: I have reviewed the patient's current medications.  Current Outpatient Medications  Medication Sig Dispense Refill   amantadine (SYMMETREL) 100 MG capsule TAKE ONE CAPSULE BY MOUTH THREE TIMES A DAY 270 capsule 0   amantadine (SYMMETREL) 100 MG capsule Take 1 capsule (100 mg total) by mouth 3 (three) times daily. 270 capsule 0   amantadine (SYMMETREL) 100 MG capsule TAKE ONE CAPSULE BY MOUTH THREE TIMES A DAY 270 capsule 0   carbidopa-levodopa  (SINEMET CR) 50-200 MG tablet TAKE ONE TABLET BY MOUTH AT BEDTIME 90 tablet 0   carbidopa-levodopa (SINEMET CR) 50-200 MG tablet Take 1 tablet by mouth at bedtime. 90 tablet 0   carbidopa-levodopa (SINEMET IR) 25-100 MG tablet TAKE TWO TABLETS BY MOUTH ONE TIME DAILY AS DIRECTED. MAY TAKE UP TO 8 TABLETS PER DAY 720 tablet 0   diazepam (VALIUM) 5 MG tablet TAKE ONE TO ONE AND A HALF TABLETS BY MOUTH TWICE DAILY AS NEEDED 60 tablet 1   Estradiol (VAGIFEM) 10 MCG TABS vaginal tablet Place 1 tablet (10 mcg total) vaginally 2 (two) times a week. Place one tablet in the vagina at bedtime twice a week. 24 tablet 3   tiZANidine (ZANAFLEX) 4 MG tablet Take 1 tablet (4 mg total) by mouth every 8 (eight) hours as needed for muscle spasms. 30 tablet 1   Vitamin D, Ergocalciferol, (DRISDOL) 1.25 MG (50000 UNIT) CAPS capsule Take 1 capsule (50,000 Units total) by mouth every 7 (seven) days. 15 capsule 3   DULoxetine (CYMBALTA) 30 MG capsule Take 1 capsule (30 mg total) by mouth 2 (two) times daily. 180 capsule 1   traZODone (DESYREL) 50 MG tablet Take 1-2 tablets (50-100 mg total) by mouth at bedtime. 180 tablet 1   No current facility-administered medications for this visit.    Medication Side Effects: None  Allergies:  Allergies  Allergen Reactions   Ciprofloxacin Other (See Comments)    SEVERE JOINT PAIN Other reaction(s): Unknown   Pramipexole Other (See Comments)    Could not get up for 24 hours   Oxycodone-Acetaminophen     Other reaction(s): Unknown    Past Medical History:  Diagnosis Date   Abnormal Pap smear of cervix 1990   unsure abnormality, colpo OK--hx of cryotherapy to cervix   Parkinson disease (HCC) 12/15    Family History  Problem Relation Age of Onset   Heart disease Mother    Alcohol abuse Father    Healthy Sister    Diabetes Maternal Grandmother    Hypertension Maternal Grandmother    Diabetes Maternal Grandfather    Hypertension Maternal Grandfather    Stroke  Paternal Grandmother    Heart attack Paternal Grandfather    Healthy Daughter  Social History   Socioeconomic History   Marital status: Married    Spouse name: Not on file   Number of children: 1   Years of education: Not on file   Highest education level: Associate degree: occupational, Scientist, product/process development, or vocational program  Occupational History   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, 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: [employer]    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  Tobacco Use   Smoking status: Former    Current packs/day: 0.00    Average packs/day: 0.5 packs/day for 25.0 years (12.5 ttl pk-yrs)    Types: Cigarettes    Start date: 07/05/1991    Quit date: 07/04/2016    Years since quitting: 7.3   Smokeless tobacco: Never  Vaping Use   Vaping status: Never Used  Substance and Sexual Activity   Alcohol use: No    Alcohol/week: 0.0 standard drinks of alcohol   Drug use: No   Sexual activity: Yes    Partners: Male    Birth control/protection: Post-menopausal    Comment: first intercourse >15, less than 5 partners  Other Topics Concern   Not on file  Social History Narrative   Right handed   Lives with husband on a 2 level home   Caffeine 1 cup day   Social Drivers of Corporate investment banker Strain: Low Risk  (12/21/2022)   Overall Financial Resource Strain (CARDIA)    Difficulty of Paying Living Expenses: Not very hard  Food Insecurity: No Food Insecurity (12/21/2022)   Hunger Vital Sign    Worried About Running Out of Food in the Last Year: Never true    Ran Out of Food in the Last Year: Never true  Transportation Needs: No Transportation Needs (12/21/2022)   PRAPARE - Administrator, Civil Service (Medical): No    Lack of Transportation (Non-Medical): No  Physical Activity: Insufficiently Active (12/21/2022)   Exercise Vital Sign    Days of Exercise per Week: 2 days    Minutes of Exercise per Session: 60 min  Stress: No Stress Concern Present (12/21/2022)   [employer] of  Occupational Health - Occupational Stress Questionnaire    Feeling of Stress : Only a little  Social Connections: Moderately Integrated (12/21/2022)   Social Connection and Isolation Panel [NHANES]    Frequency of Communication with Friends and Family: More than three times a week    Frequency of Social Gatherings with Friends and Family: Patient declined    Attends Religious Services: More than 4 times per year    Active Member of [employer] or Organizations: No    Attends Engineer, structural: Not on file    Marital Status: Married  Catering manager Violence: Not on file    Past Medical History, Surgical history, Social history, and Family history were reviewed and updated as appropriate.   Please see review of systems for further details on the patient's review from today.   Objective:   Physical Exam:  LMP 01/14/2016 (Exact Date)   Physical Exam Constitutional:      General: She is not in acute distress. Musculoskeletal:        General: No deformity.  Neurological:     Mental Status: She is alert and oriented to person, place, and time.     Motor: No weakness.     Coordination: Coordination normal.  VERY FIDGETY chronically. Psychiatric:        Attention and Perception: Attention and perception normal. She does not perceive auditory or visual  hallucinations.        Mood and Affect: Mood is less depressed. Mood is less anxious. Still some anxiety and dep situationally not bad and no panic. Affect is not labile, blunt, angry, tearful or inappropriate.        Speech: Speech normal.        Behavior: Behavior normal.        Thought Content: Thought content normal. Thought content is not paranoid or delusional. Thought content does not include homicidal or suicidal ideation. Thought content does not include homicidal or suicidal plan.        Cognition and Memory: Cognition and memory normal.        Judgment: Judgment normal.     Comments: Insight intact   Lab Review:      Component Value Date/Time   NA 137 03/07/2023 1112   NA 141 08/14/2019 0828   K 4.4 03/07/2023 1112   CL 100 03/07/2023 1112   CO2 30 03/07/2023 1112   GLUCOSE 84 03/07/2023 1112   BUN 8 03/07/2023 1112   BUN 13 08/14/2019 0828   CREATININE 0.76 03/07/2023 1112   CREATININE 0.77 12/28/2020 1406   CALCIUM 9.2 03/07/2023 1112   PROT 6.8 03/07/2023 1112   PROT 7.0 08/14/2019 0828   ALBUMIN 4.2 03/07/2023 1112   ALBUMIN 4.2 08/14/2019 0828   AST 14 03/07/2023 1112   ALT 5 03/07/2023 1112   ALKPHOS 60 03/07/2023 1112   BILITOT 0.6 03/07/2023 1112   BILITOT 0.6 08/14/2019 0828   GFRNONAA >60 01/19/2020 1233   GFRAA >60 01/19/2020 1233       Component Value Date/Time   WBC 6.3 03/07/2023 1112   RBC 4.44 03/07/2023 1112   HGB 13.7 03/07/2023 1112   HGB 14.6 08/14/2019 0828   HGB 13.2 04/02/2015 0905   HCT 41.1 03/07/2023 1112   HCT 43.7 08/14/2019 0828   PLT 219.0 03/07/2023 1112   PLT 257 08/14/2019 0828   MCV 92.4 03/07/2023 1112   MCV 94 08/14/2019 0828   MCH 30.8 12/28/2020 1406   MCHC 33.3 03/07/2023 1112   RDW 12.7 03/07/2023 1112   RDW 12.1 08/14/2019 0828   LYMPHSABS 2.2 03/07/2023 1112   MONOABS 0.5 03/07/2023 1112   EOSABS 0.1 03/07/2023 1112   BASOSABS 0.0 03/07/2023 1112    No results found for: "POCLITH", "LITHIUM"   No results found for: "PHENYTOIN", "PHENOBARB", "VALPROATE", "CBMZ"   01/17/22 labs including TSH, B12, CMP, CBC, D all normal  03/07/23 vit D 48 on 50K weekly., normal TSH  .res Assessment: Plan:    Sarah Welch was seen today for follow-up, depression, anxiety, sleeping problem and add.  Diagnoses and all orders for this visit:  Major depressive disorder with single episode, in partial remission (HCC) -     DULoxetine (CYMBALTA) 30 MG capsule; Take 1 capsule (30 mg total) by mouth 2 (two) times daily.  Panic disorder without agoraphobia -     DULoxetine (CYMBALTA) 30 MG capsule; Take 1 capsule (30 mg total) by mouth 2 (two) times  daily.  Generalized anxiety disorder -     DULoxetine (CYMBALTA) 30 MG capsule; Take 1 capsule (30 mg total) by mouth 2 (two) times daily.  Fatigue due to depression  Insomnia due to mental condition -     traZODone (DESYREL) 50 MG tablet; Take 1-2 tablets (50-100 mg total) by mouth at bedtime.  Low vitamin D level     30 min face to face time with patient was spent on  counseling and coordination of care. We discussed Tyreka has the diagnoses noted above and was not well controlled on Wellbutrin and Lexapro.  Her symptoms of depression and anxiety are better with the switch to duloxetine 30 mg twice daily.  No panic now. Also dx PD without med changes  previously tends to have some dizziness if she takes  duloxetine 60 mg at once.  Otherwise she is tolerating the current dosage.  She has had near resolution of depression and resolution of the irritability.  She still has some episodic minor anxiety which is overall improved.  She is functioning better and more active.  She is satisfied with the med response at this time. Continue current duloxetine 30 BID,   Discussed options for fatigue which is better.  Recommend she restart vitamin D.  She asked for the once weekly supplement. Modafinil can be used off label for fatigue and people with chronic illnesses such as Parkinson's disease.  This may be helpful.  Discussed side effects in detail Stopped Modafinil 200 mg every morning prn didn't seem to help much.  Option retry up to 400 mg bc primary problem DT tiredness.  trazodone 50-100 mg if needed for sleep  Disc SE in detail and SSRI withdrawal sx.  If it fails call back  She stopped sertraline 50 mg daily for panic and anxiety in Dec early It is uncommon to use an SSRI and an SNRI together.  However her depression has not responded to pure SSRIs in the past.  She has had some improvement in depression with the SNRI duloxetine but cannot tolerate a higher dose.  We want to avoid  augmentation with partial dopamine agonist or any other antipsychotic due to Parkinson's disease.  Therefore the most efficient and effective way to reduce her anxiety which is her chief complaint would be the addition of a low-dose SSRI to be combined with the duloxetine.  There is a low risk of serotonin syndrome because the dose of the sertraline will be low  We discussed the short-term risks associated with benzodiazepines including sedation and increased fall risk among others.  Discussed long-term side effect risk including dependence, potential withdrawal symptoms, and the potential eventual dose-related risk of dementia.  But recent studies from 2020 dispute this association between benzodiazepines and dementia risk. Newer studies in 2020 do not support an association with dementia. Rarely takes BZ  Continue vit s D, B, add MVI for fatiuge.    Plan:  duloxetine 30 BID, rare diazepam 5 mg , trazodone 100 mg HS.  No other psych meds. Continue vit D 50 K producing midrange level  Retry modafinil 300-400 mg AM  Follow-up 6 mos  Call if you have any additional symptoms or problems with the medication.  Meredith Staggers MD, DFAPA  Please see After Visit Summary for patient specific instructions.  Future Appointments  Date Time Provider Department Center  11/03/2023  9:15 AM Tat, Octaviano Batty, DO [facility] None    No orders of the defined types were placed in this encounter.    -------------------------------

## 2023-11-03 ENCOUNTER — Ambulatory Visit: Payer: Federal, State, Local not specified - PPO | Admitting: Neurology

## 2023-11-03 ENCOUNTER — Encounter: Payer: Self-pay | Admitting: Neurology

## 2023-11-03 VITALS — BP 126/76 | HR 91 | Wt 159.6 lb

## 2023-11-03 DIAGNOSIS — G20A1 Parkinson's disease without dyskinesia, without mention of fluctuations: Secondary | ICD-10-CM | POA: Diagnosis not present

## 2023-11-03 DIAGNOSIS — F411 Generalized anxiety disorder: Secondary | ICD-10-CM | POA: Diagnosis not present

## 2023-11-03 DIAGNOSIS — G20B2 Parkinson's disease with dyskinesia, with fluctuations: Secondary | ICD-10-CM

## 2023-11-03 MED ORDER — AMANTADINE HCL 100 MG PO CAPS: 100.0000 mg | ORAL_CAPSULE | Freq: Three times a day (TID) | ORAL | 1 refills | Status: DC

## 2023-11-03 MED ORDER — CARBIDOPA-LEVODOPA ER 50-200 MG PO TBCR: 1.0000 | EXTENDED_RELEASE_TABLET | Freq: Every day | ORAL | 1 refills | Status: AC

## 2023-11-03 MED ORDER — CARBIDOPA-LEVODOPA 25-100 MG PO TABS: 2.0000 | ORAL_TABLET | Freq: Four times a day (QID) | ORAL | 1 refills | Status: DC

## 2023-11-03 NOTE — Patient Instructions (Signed)
We discussed vyalev.  We will call you in 2 weeks after you have had time to discuss it.  The physicians and staff at [facility] are committed to providing excellent care. You may receive a survey requesting feedback about your experience at our office. We strive to receive "very good" responses to the survey questions. If you feel that your experience would prevent you from giving the office a "very good " response, please contact our office to try to remedy the situation. We may be reached at [phone]. Thank you for taking the time out of your busy day to complete the survey.

## 2023-11-04 ENCOUNTER — Other Ambulatory Visit (HOSPITAL_COMMUNITY): Payer: Self-pay

## 2023-11-17 ENCOUNTER — Telehealth: Payer: Self-pay | Admitting: Neurology

## 2023-11-17 NOTE — Telephone Encounter (Signed)
 Called patient and left message to call office back about the pump and moving forward

## 2023-11-17 NOTE — Telephone Encounter (Signed)
-----   Message from [place] Deniel Mcquiston sent at 11/03/2023  9:33 AM EST ----- Call her and see what she thinks about vyalev

## 2023-11-20 NOTE — Telephone Encounter (Signed)
 Lmovm to call the office back.

## 2023-11-20 NOTE — Telephone Encounter (Signed)
 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

## 2024-01-30 ENCOUNTER — Other Ambulatory Visit: Payer: Self-pay | Admitting: Neurology

## 2024-01-30 DIAGNOSIS — G20B2 Parkinson's disease with dyskinesia, with fluctuations: Secondary | ICD-10-CM

## 2024-01-30 DIAGNOSIS — G20A1 Parkinson's disease without dyskinesia, without mention of fluctuations: Secondary | ICD-10-CM

## 2024-01-30 MED ORDER — AMANTADINE HCL 100 MG PO CAPS: 100.0000 mg | ORAL_CAPSULE | Freq: Three times a day (TID) | ORAL | 0 refills | Status: DC

## 2024-02-21 ENCOUNTER — Other Ambulatory Visit: Payer: Self-pay | Admitting: Psychiatry

## 2024-02-21 DIAGNOSIS — F5105 Insomnia due to other mental disorder: Secondary | ICD-10-CM

## 2024-04-10 ENCOUNTER — Ambulatory Visit: Admitting: Nurse Practitioner

## 2024-04-10 VITALS — BP 124/80 | HR 84 | Temp 96.9°F | Ht 66.0 in | Wt 156.8 lb

## 2024-04-10 DIAGNOSIS — F5105 Insomnia due to other mental disorder: Secondary | ICD-10-CM | POA: Diagnosis not present

## 2024-04-10 DIAGNOSIS — F419 Anxiety disorder, unspecified: Secondary | ICD-10-CM

## 2024-04-10 DIAGNOSIS — R252 Cramp and spasm: Secondary | ICD-10-CM

## 2024-04-10 DIAGNOSIS — G8929 Other chronic pain: Secondary | ICD-10-CM

## 2024-04-10 DIAGNOSIS — F324 Major depressive disorder, single episode, in partial remission: Secondary | ICD-10-CM

## 2024-04-10 DIAGNOSIS — M545 Low back pain, unspecified: Secondary | ICD-10-CM | POA: Diagnosis not present

## 2024-04-10 DIAGNOSIS — G20A1 Parkinson's disease without dyskinesia, without mention of fluctuations: Secondary | ICD-10-CM

## 2024-04-10 DIAGNOSIS — F411 Generalized anxiety disorder: Secondary | ICD-10-CM

## 2024-04-10 DIAGNOSIS — F410 Panic disorder [episodic paroxysmal anxiety] without agoraphobia: Secondary | ICD-10-CM

## 2024-04-10 MED ORDER — TRAZODONE HCL 50 MG PO TABS: 50.0000 mg | ORAL_TABLET | Freq: Every day | ORAL | 1 refills | Status: DC

## 2024-04-10 MED ORDER — TIZANIDINE HCL 4 MG PO TABS: 4.0000 mg | ORAL_TABLET | Freq: Three times a day (TID) | ORAL | 1 refills | Status: AC | PRN

## 2024-04-10 MED ORDER — DIAZEPAM 5 MG PO TABS: ORAL_TABLET | ORAL | 1 refills | Status: AC

## 2024-04-10 NOTE — Patient Instructions (Signed)
It was great to see you!  We are checking your labs today and will let you know the results via mychart/phone.   I have refilled your medications.   Let's follow-up in 6 months, sooner if you have concerns.  If a referral was placed today, you will be contacted for an appointment. Please note that routine referrals can sometimes take up to 3-4 weeks to process. Please call our office if you haven't heard anything after this time frame.  Take care,  Tamikia Chowning, NP  

## 2024-04-10 NOTE — Progress Notes (Unsigned)
 Established Patient Office Visit  Subjective   Patient ID: Sarah Welch, female    DOB: 05/05/1971  Age: 53 y.o. MRN: [id number]  Chief Complaint  Patient presents with   Medication Management    RX Refills    HPI Discussed the use of AI scribe software for clinical note transcription with the patient, who gave verbal consent to proceed.  History of Present Illness   Sarah Welch is a 53 year old female with Parkinson's disease who presents with increased stiffness and cramps.  She experiences debilitating cramps and stiffness, which she attributes to Parkinson's disease. Shortness of breath and dizziness have been present for two to three years, sometimes occurring with Parkinson's symptoms and resolving with rest. She associates these with her medications and possibly anxiety.  She takes Cymbalta  and uses muscle relaxants like tizanidine  and diazepam  sparingly. She prefers to avoid medication unless necessary. Her back pain has been mild recently, managed with muscle rub and a roller. She plans to rejoin the [facility] for physical activity as she returns to work.        04/11/2024    8:25 AM 07/13/2023   11:08 AM 03/07/2023   10:41 AM 12/21/2022    3:26 PM 08/25/2022   11:17 AM  Depression screen PHQ 2/9  Decreased Interest 1 1 0 0 1  Down, Depressed, Hopeless 1 1 0 0 1  PHQ - 2 Score 2 2 0 0 2  Altered sleeping 2 1 1 1 1   Tired, decreased energy 2 1 1 2 3   Change in appetite 1 1 0 1 0  Feeling bad or failure about yourself  0 0 0 0 0  Trouble concentrating 0 0 0 0 1  Moving slowly or fidgety/restless 0 0 0 0 0  Suicidal thoughts 0 0 0 0 0  PHQ-9 Score 7 5 2 4 7   Difficult doing work/chores Somewhat difficult Somewhat difficult Somewhat difficult Somewhat difficult Somewhat difficult      04/11/2024    8:25 AM 07/13/2023   11:09 AM 03/07/2023   10:41 AM 12/21/2022    3:26 PM  GAD 7 : Generalized Anxiety Score  Nervous, Anxious, on Edge 2 1 1 1   Control/stop  worrying 1 1 1  0  Worry too much - different things 1 1 1  0  Trouble relaxing 2 1 1  0  Restless 1 0 1 0  Easily annoyed or irritable 1 0 1 0  Afraid - awful might happen 0 0 0 0  Total GAD 7 Score 8 4 6 1   Anxiety Difficulty Somewhat difficult  Somewhat difficult Somewhat difficult    ROS See pertinent positives and negatives per HPI.    Objective:     BP 124/80 (BP Location: Left Arm, Patient Position: Sitting, Cuff Size: Normal)   Pulse 84   Temp (!) 96.9 F (36.1 C)   Ht 5' 6 (1.676 m)   Wt 156 lb 12.8 oz (71.1 kg)   LMP 01/14/2016 (Exact Date)   SpO2 99%   BMI 25.31 kg/m     Physical Exam Vitals and nursing note reviewed.  Constitutional:      General: She is not in acute distress.    Appearance: Normal appearance.  HENT:     Head: Normocephalic.  Eyes:     Conjunctiva/sclera: Conjunctivae normal.  Cardiovascular:     Rate and Rhythm: Normal rate and regular rhythm.     Pulses: Normal pulses.     Heart sounds: Normal  heart sounds.  Pulmonary:     Effort: Pulmonary effort is normal.     Breath sounds: Normal breath sounds.  Musculoskeletal:     Cervical back: Normal range of motion.  Skin:    General: Skin is warm.  Neurological:     General: No focal deficit present.     Mental Status: She is alert and oriented to person, place, and time.  Psychiatric:        Mood and Affect: Mood normal.        Behavior: Behavior normal.        Thought Content: Thought content normal.        Judgment: Judgment normal.    The 10-year ASCVD risk score (Arnett DK, et al., 2019) is: 1%    Assessment & Plan:   Problem List Items Addressed This Visit       Nervous and Auditory   Parkinson's disease [facility])   She experiences increased stiffness and cramps affecting daily activities and is awaiting a neurologist appointment in August to consider medication adjustment and Botox injections. Labs will be checked for potassium. Follow up with the neurologist for potential  medication adjustment or Botox injections.       Relevant Orders   Comprehensive metabolic panel with GFR (Completed)   CBC with Differential/Platelet (Completed)     Other   Chronic midline low back pain without sciatica   Chronic, stable. Continue regular stretches, muscle rubs, and tizanidine  4mg  every 8 hours as needed.       Relevant Medications   tiZANidine  (ZANAFLEX ) 4 MG tablet   traZODone  (DESYREL ) 50 MG tablet   Anxiety - Primary   Chronic, stable. Continue cymbalta  30mg  BID and valium  5mg  BID prn panic attack. Follow-up with any concerns.       Relevant Medications   traZODone  (DESYREL ) 50 MG tablet   diazepam  (VALIUM ) 5 MG tablet   Leg cramps   Chronic, ongoing. Check CMP, CBC today.       Relevant Orders   Comprehensive metabolic panel with GFR (Completed)   CBC with Differential/Platelet (Completed)   Insomnia due to mental condition   Chronic, stable. Continue trazodone  50-100mg  at bedtime as needed.       Relevant Medications   traZODone  (DESYREL ) 50 MG tablet     Return in about 6 months (around 10/11/2024) for CPE.    Tinnie DELENA Harada, NP

## 2024-04-11 ENCOUNTER — Ambulatory Visit: Payer: Self-pay | Admitting: Nurse Practitioner

## 2024-04-11 DIAGNOSIS — F5105 Insomnia due to other mental disorder: Secondary | ICD-10-CM | POA: Insufficient documentation

## 2024-04-11 LAB — CBC WITH DIFFERENTIAL/PLATELET
Basophils Absolute: 0.1 K/uL (ref 0.0–0.1)
Basophils Relative: 0.8 % (ref 0.0–3.0)
Eosinophils Absolute: 0.2 K/uL (ref 0.0–0.7)
Eosinophils Relative: 3.7 % (ref 0.0–5.0)
HCT: 41.5 % (ref 36.0–46.0)
Hemoglobin: 14.2 g/dL (ref 12.0–15.0)
Lymphocytes Relative: 36.8 % (ref 12.0–46.0)
Lymphs Abs: 2.4 K/uL (ref 0.7–4.0)
MCHC: 34.2 g/dL (ref 30.0–36.0)
MCV: 91 fl (ref 78.0–100.0)
Monocytes Absolute: 0.5 K/uL (ref 0.1–1.0)
Monocytes Relative: 7.7 % (ref 3.0–12.0)
Neutro Abs: 3.3 K/uL (ref 1.4–7.7)
Neutrophils Relative %: 51 % (ref 43.0–77.0)
Platelets: 232 K/uL (ref 150.0–400.0)
RBC: 4.56 Mil/uL (ref 3.87–5.11)
RDW: 12.8 % (ref 11.5–15.5)
WBC: 6.4 K/uL (ref 4.0–10.5)

## 2024-04-11 LAB — COMPREHENSIVE METABOLIC PANEL WITH GFR
ALT: 5 U/L (ref 0–35)
AST: 19 U/L (ref 0–37)
Albumin: 4.2 g/dL (ref 3.5–5.2)
Alkaline Phosphatase: 70 U/L (ref 39–117)
BUN: 15 mg/dL (ref 6–23)
CO2: 29 meq/L (ref 19–32)
Calcium: 9.3 mg/dL (ref 8.4–10.5)
Chloride: 101 meq/L (ref 96–112)
Creatinine, Ser: 0.88 mg/dL (ref 0.40–1.20)
GFR: 74.48 mL/min (ref >60.00)
Glucose, Bld: 86 mg/dL (ref 70–99)
Potassium: 4.1 meq/L (ref 3.5–5.1)
Sodium: 137 meq/L (ref 135–145)
Total Bilirubin: 0.7 mg/dL (ref 0.2–1.2)
Total Protein: 6.8 g/dL (ref 6.0–8.3)

## 2024-04-11 NOTE — Assessment & Plan Note (Addendum)
 Chronic, stable. Continue cymbalta  30mg  BID and valium  5mg  BID prn panic attack. Follow-up with any concerns.

## 2024-04-11 NOTE — Assessment & Plan Note (Addendum)
 She experiences increased stiffness and cramps affecting daily activities and is awaiting a neurologist appointment in August to consider medication adjustment and Botox injections. Labs will be checked for potassium. Follow up with the neurologist for potential medication adjustment or Botox injections.

## 2024-04-11 NOTE — Assessment & Plan Note (Signed)
 Chronic, stable. Continue regular stretches, muscle rubs, and tizanidine  4mg  every 8 hours as needed.

## 2024-04-11 NOTE — Assessment & Plan Note (Signed)
 Chronic, stable. Continue trazodone  50-100mg  at bedtime as needed.

## 2024-04-11 NOTE — Assessment & Plan Note (Signed)
 Chronic, ongoing. Check CMP, CBC today.

## 2024-04-16 NOTE — Progress Notes (Unsigned)
 Assessment/Plan:   1.  Parkinsons Disease  -continue carbidopa /levodopa  25/100, 2 po qid  - Add entacapone , 200 mg with first 3 dosages of levodopa .             -Continue carbidopa /levodopa  50/200 at bedtime.             -continue amantadine100 mg tid.  She has some livedo reticularis but would rather deal with that than they dyskinesia.  She tried to decrease it but didn't like the excess dyskinesia.    - Long discussion about Vyalev and Onapgo.  She does not want to start right now.  - Extensive discussion about DBS therapy.  I do think that this is in her future.  She is not ready to pursue, but agrees that this may be something that she needs to pursue in the future.  2.  Restless leg             -Continue carbidopa /levodopa  50/200 CR at bedtime   3.  GAD/MDD  -doing much better             -Under the care of Dr. Geoffry, although she has not seen him since February.  She is currently being treated with duloxetine ,  Valium , trazodone   - We discussed the addition of counseling.    Subjective:   Sarah Welch was seen today in follow up for Parkinsons disease.  My previous records were reviewed prior to todays visit as well as outside records available to me.  Last visit, we discussed Vyalev, but when we called her not long after the visit, she ultimately stated that she was not interested in the medication.  She does remain on levodopa  as well as amantadine .  She is having cramping - can be day or night.  Yesterday was good until night and then had the cramps but the day prior to do that it was present all day.  Having internal tremor.  She stopped working since last visit but she plans to restart at [employer].  She estimates that she has 6 hours of off time per day!  Current prescribed movement disorder medications:  Carbidopa /levodopa  25/100, 2 po qid (6:30am/10am/1pm/4pm) Carbidopa /levodopa  50/200 q hs  Amantadine  100 mg 3 times per day  Inbrija  (rarely  used)  PREVIOUS MEDICATIONS:  citalopram , Lexapro ; Wellbutrin ; azilect (no help); pramipexole (swelling, nausea); neupro  (states too expensive); ropinrole (not sure it helped but only at starting dose when she d/c); inbrija  (only used 1 time - she states may have helped - no SE)  ALLERGIES:   Allergies  Allergen Reactions   Ciprofloxacin Other (See Comments)    SEVERE JOINT PAIN Other reaction(s): Unknown   Pramipexole Other (See Comments)    Could not get up for 24 hours   Oxycodone-Acetaminophen     Other reaction(s): Unknown    CURRENT MEDICATIONS:  Current Meds  Medication Sig   amantadine  (SYMMETREL ) 100 MG capsule Take 1 capsule (100 mg total) by mouth 3 (three) times daily.   carbidopa -levodopa  (SINEMET  CR) 50-200 MG tablet Take 1 tablet by mouth at bedtime.   carbidopa -levodopa  (SINEMET  CR) 50-200 MG tablet Take 1 tablet by mouth at bedtime.   carbidopa -levodopa  (SINEMET  IR) 25-100 MG tablet Take 2 tablets by mouth 4 (four) times daily.   diazepam  (VALIUM ) 5 MG tablet TAKE ONE TO ONE AND A HALF TABLETS BY MOUTH TWICE DAILY AS NEEDED   DULoxetine  (CYMBALTA ) 30 MG capsule Take 1 capsule (30 mg total) by mouth 2 (two) times  daily.   entacapone  (COMTAN ) 200 MG tablet Take 1 tablet (200 mg total) by mouth 3 (three) times daily. Take with first 3 dosages of levodopa    Estradiol  (VAGIFEM ) 10 MCG TABS vaginal tablet Place 1 tablet (10 mcg total) vaginally 2 (two) times a week. Place one tablet in the vagina at bedtime twice a week.   tiZANidine  (ZANAFLEX ) 4 MG tablet Take 1 tablet (4 mg total) by mouth every 8 (eight) hours as needed for muscle spasms.   traZODone  (DESYREL ) 50 MG tablet Take 1-2 tablets (50-100 mg total) by mouth at bedtime.   Vitamin D , Ergocalciferol , (DRISDOL ) 1.25 MG (50000 UNIT) CAPS capsule Take 1 capsule (50,000 Units total) by mouth every 7 (seven) days.     Objective:   PHYSICAL EXAMINATION:    VITALS:   There were no vitals filed for this  visit.   Wt Readings from Last 3 Encounters:  04/10/24 156 lb 12.8 oz (71.1 kg)  11/03/23 159 lb 9.6 oz (72.4 kg)  10/11/23 159 lb (72.1 kg)    GEN:  The patient appears stated age and is in NAD. HEENT:  Normocephalic, atraumatic.  The mucous membranes are moist. The superficial temporal arteries are without ropiness or tenderness. CV:  RRR Lungs:  CTAb  Neurological examination:  Orientation: The patient is alert and oriented x3. Cranial nerves: There is good facial symmetry without facial hypomimia. The speech is fluent and clear. Soft palate rises symmetrically and there is no tongue deviation. Hearing is intact to conversational tone. Sensation: Sensation is intact to light touch throughout Motor: Strength is at least antigravity x4.  Took medication 15 min prior to being seen today.  Movement examination: Tone: There is nl tone in the UE/LE Abnormal movements: there is mod LLE dyskinesia, similar to prior Coordination:  There is mild decremation with finger taps on the L Gait and Station: The patient has no difficulty arising out of a deep-seated chair without the use of the hands. The patient's stride length is good with excess arm swing bilaterally  I have reviewed and interpreted the following labs independently    Chemistry      Component Value Date/Time   NA 137 04/10/2024 1626   NA 141 08/14/2019 0828   K 4.1 04/10/2024 1626   CL 101 04/10/2024 1626   CO2 29 04/10/2024 1626   BUN 15 04/10/2024 1626   BUN 13 08/14/2019 0828   CREATININE 0.88 04/10/2024 1626   CREATININE 0.77 12/28/2020 1406      Component Value Date/Time   CALCIUM 9.3 04/10/2024 1626   ALKPHOS 70 04/10/2024 1626   AST 19 04/10/2024 1626   ALT 5 04/10/2024 1626   BILITOT 0.7 04/10/2024 1626   BILITOT 0.6 08/14/2019 0828       Lab Results  Component Value Date   WBC 6.4 04/10/2024   HGB 14.2 04/10/2024   HCT 41.5 04/10/2024   MCV 91.0 04/10/2024   PLT 232.0 04/10/2024    Lab  Results  Component Value Date   TSH 0.86 03/07/2023   Total time spent on today's visit was 42 minutes, including both face-to-face time and nonface-to-face time.  Time included that spent on review of records (prior notes available to me/labs/imaging if pertinent), discussing treatment and goals, answering patient's questions and coordinating care.    Cc:  Nedra Tinnie LABOR, NP

## 2024-04-17 ENCOUNTER — Ambulatory Visit: Admitting: Neurology

## 2024-04-17 VITALS — BP 136/72 | HR 68 | Ht 66.0 in | Wt 159.2 lb

## 2024-04-17 DIAGNOSIS — G20B2 Parkinson's disease with dyskinesia, with fluctuations: Secondary | ICD-10-CM | POA: Diagnosis not present

## 2024-04-17 MED ORDER — ENTACAPONE 200 MG PO TABS: 200.0000 mg | ORAL_TABLET | Freq: Three times a day (TID) | ORAL | 1 refills | Status: AC

## 2024-04-17 NOTE — Patient Instructions (Addendum)
 Start entacapone  200 mg, take 1 tablet with first 3 dosages of levodopa  I want you to consider the pumps again (onapgo/vyalev) as well as the dbs  SAVE THE DATE!  We are planning a Parkinsons Disease educational symposium at [facility], [address] [place], [place], [place] [address] on September 19.  We will have a movement disorder physician expert from [employer] coming to speak and a caregiver speaker.  We will have a panel of experts that will show you who you may need on your team of people on your journey with Parkinsons.  I hope to see you there!  Use this QR code to register by scanning it with the camera app on your phone:      Need more help with registration?  Contact [email or web address]

## 2024-04-18 ENCOUNTER — Encounter: Payer: Self-pay | Admitting: Neurology

## 2024-04-18 ENCOUNTER — Other Ambulatory Visit: Payer: Self-pay

## 2024-04-18 DIAGNOSIS — G20A1 Parkinson's disease without dyskinesia, without mention of fluctuations: Secondary | ICD-10-CM

## 2024-04-18 DIAGNOSIS — G20B2 Parkinson's disease with dyskinesia, with fluctuations: Secondary | ICD-10-CM

## 2024-04-18 MED ORDER — AMANTADINE HCL 100 MG PO CAPS: 100.0000 mg | ORAL_CAPSULE | Freq: Three times a day (TID) | ORAL | 1 refills | Status: AC

## 2024-04-18 MED ORDER — CARBIDOPA-LEVODOPA 25-100 MG PO TABS: 2.0000 | ORAL_TABLET | Freq: Four times a day (QID) | ORAL | 1 refills | Status: AC

## 2024-04-18 MED ORDER — CARBIDOPA-LEVODOPA ER 50-200 MG PO TBCR: 1.0000 | EXTENDED_RELEASE_TABLET | Freq: Every day | ORAL | 1 refills | Status: DC

## 2024-05-01 ENCOUNTER — Ambulatory Visit: Payer: Federal, State, Local not specified - PPO | Admitting: Psychiatry

## 2024-05-08 ENCOUNTER — Ambulatory Visit: Payer: Federal, State, Local not specified - PPO | Admitting: Neurology

## 2024-06-10 ENCOUNTER — Encounter: Payer: Self-pay | Admitting: Neurology

## 2024-06-20 ENCOUNTER — Encounter: Payer: Self-pay | Admitting: Nurse Practitioner

## 2024-06-20 ENCOUNTER — Ambulatory Visit: Admitting: Nurse Practitioner

## 2024-06-20 VITALS — BP 124/80 | HR 78 | Temp 97.2°F | Ht 66.0 in | Wt 151.6 lb

## 2024-06-20 DIAGNOSIS — M542 Cervicalgia: Secondary | ICD-10-CM | POA: Diagnosis not present

## 2024-06-20 DIAGNOSIS — Z1231 Encounter for screening mammogram for malignant neoplasm of breast: Secondary | ICD-10-CM | POA: Diagnosis not present

## 2024-06-20 DIAGNOSIS — R7989 Other specified abnormal findings of blood chemistry: Secondary | ICD-10-CM

## 2024-06-20 MED ORDER — LIDOCAINE 5 % EX PTCH: 1.0000 | MEDICATED_PATCH | CUTANEOUS | 0 refills | Status: DC

## 2024-06-20 MED ORDER — VITAMIN D (ERGOCALCIFEROL) 1.25 MG (50000 UNIT) PO CAPS: 50000.0000 [IU] | ORAL_CAPSULE | ORAL | 3 refills | Status: AC

## 2024-06-20 MED ORDER — MELOXICAM 15 MG PO TABS: 15.0000 mg | ORAL_TABLET | Freq: Every day | ORAL | 0 refills | Status: AC

## 2024-06-20 NOTE — Progress Notes (Signed)
 Acute Office Visit  Subjective:     Patient ID: Sarah Welch, female    DOB: Sep 02, 1971, 53 y.o.   MRN: [id number]  Chief Complaint  Patient presents with   Neck Pain    Since Monday-radiates down to shoulders and causing weakness in hands, Rx refill    HPI Discussed the use of AI scribe software for clinical note transcription with the patient, who gave verbal consent to proceed.  History of Present Illness   Sarah Welch is a 53 year old female with neck arthritis who presents with neck pain.  She has experienced neck pain since Monday, with discomfort originating from the top of her neck and spreading into her shoulders, occasionally extending to her bra strap. The pain is intermittent and not associated with a clear trigger. She experiences weakness in her arms but no radiating pain.  Current management includes tizanidine , ibuprofen , an Advil  Tylenol mix, and Voltaren gel. Despite initial worsening, a muscle relaxer at night has improved her sleep. She has not tried physical therapy or had an MRI for her neck.  Previous imaging indicated neck arthritis. She has not engaged in activities likely to cause pain but notes that computer work involving looking down and reaching for the phone recently caused discomfort. She is currently taking tizanidine  for muscle relaxation.      ROS See pertinent positives and negatives per HPI.     Objective:    BP 124/80 (BP Location: Left Arm, Patient Position: Sitting, Cuff Size: Normal)   Pulse 78   Temp (!) 97.2 F (36.2 C)   Ht 5' 6 (1.676 m)   Wt 151 lb 9.6 oz (68.8 kg)   LMP 01/14/2016 (Exact Date)   SpO2 100%   BMI 24.47 kg/m    Physical Exam Vitals and nursing note reviewed.  Constitutional:      General: She is not in acute distress.    Appearance: Normal appearance.  HENT:     Head: Normocephalic.  Eyes:     Conjunctiva/sclera: Conjunctivae normal.  Pulmonary:     Effort: Pulmonary effort is  normal.  Musculoskeletal:        General: Tenderness (slight tenderness left posterior neck) present. No swelling. Normal range of motion.     Cervical back: Normal range of motion.     Comments: Grip strength equal, empty can test negative.   Skin:    General: Skin is warm.  Neurological:     General: No focal deficit present.     Mental Status: She is alert and oriented to person, place, and time.  Psychiatric:        Mood and Affect: Mood normal.        Behavior: Behavior normal.        Thought Content: Thought content normal.        Judgment: Judgment normal.       Assessment & Plan:   Problem List Items Addressed This Visit       Other   Cervicalgia - Primary   Chronic neck pain has recently worsened, radiating to the shoulders and causing arm weakness. X-ray reveals arthritis. Current treatment with tizanidine  and ibuprofen  offers limited relief. Meloxicam 15mg  daily as needed is recommended for pain and inflammation, replacing ibuprofen . Daily home exercise stretches are advised. Order MRI cervical spine. Lidocaine patches are prescribed for daytime use, to be removed at night. Tizanidine  4mg  should continue as needed for muscle relaxation. Follow-up in 3 months or sooner with concerns.  Relevant Orders   MR Cervical Spine Wo Contrast   Other Visit Diagnoses       Low vitamin D  level       Vitamin D  refill sent to the pharmacy. Check labs next visit.   Relevant Medications   Vitamin D , Ergocalciferol , (DRISDOL ) 1.25 MG (50000 UNIT) CAPS capsule     Encounter for screening mammogram for malignant neoplasm of breast       Mammogram ordered today   Relevant Orders   MM 3D SCREENING MAMMOGRAM BILATERAL BREAST      Meds ordered this encounter  Medications   Vitamin D , Ergocalciferol , (DRISDOL ) 1.25 MG (50000 UNIT) CAPS capsule    Sig: Take 1 capsule (50,000 Units total) by mouth every 7 (seven) days.    Dispense:  12 capsule    Refill:  3   meloxicam (MOBIC)  15 MG tablet    Sig: Take 1 tablet (15 mg total) by mouth daily.    Dispense:  30 tablet    Refill:  0   lidocaine (LIDODERM) 5 %    Sig: Place 1 patch onto the skin daily. Remove & Discard patch within 12 hours or as directed by MD    Dispense:  30 patch    Refill:  0    Return in about 3 months (around 09/20/2024) for CPE.  Tinnie DELENA Harada, NP

## 2024-06-20 NOTE — Patient Instructions (Addendum)
 It was great to see you!  Start lidocaine patch during the day and take it off at night  Start meloxicam 1 tablet daily with food as needed for pain - do not take ibuprofen  with this   We will get a MRI of your neck   Start the stretches daily   Let's follow-up in 3 months, sooner if you have concerns.  If a referral was placed today, you will be contacted for an appointment. Please note that routine referrals can sometimes take up to 3-4 weeks to process. Please call our office if you haven't heard anything after this time frame.  Take care,  Tinnie Harada, NP

## 2024-06-20 NOTE — Assessment & Plan Note (Signed)
 Chronic neck pain has recently worsened, radiating to the shoulders and causing arm weakness. X-ray reveals arthritis. Current treatment with tizanidine  and ibuprofen  offers limited relief. Meloxicam 15mg  daily as needed is recommended for pain and inflammation, replacing ibuprofen . Daily home exercise stretches are advised. Order MRI cervical spine. Lidocaine patches are prescribed for daytime use, to be removed at night. Tizanidine  4mg  should continue as needed for muscle relaxation. Follow-up in 3 months or sooner with concerns.

## 2024-06-23 ENCOUNTER — Ambulatory Visit (HOSPITAL_BASED_OUTPATIENT_CLINIC_OR_DEPARTMENT_OTHER)
Admission: RE | Admit: 2024-06-23 | Discharge: 2024-06-23 | Disposition: A | Discharge status: Home / Self Care | Source: Ambulatory Visit | Attending: Nurse Practitioner | Admitting: Nurse Practitioner

## 2024-06-23 DIAGNOSIS — M542 Cervicalgia: Secondary | ICD-10-CM | POA: Insufficient documentation

## 2024-06-25 ENCOUNTER — Ambulatory Visit: Payer: Self-pay | Admitting: Nurse Practitioner

## 2024-07-04 ENCOUNTER — Ambulatory Visit: Admitting: Nurse Practitioner

## 2024-07-04 ENCOUNTER — Encounter: Payer: Self-pay | Admitting: Nurse Practitioner

## 2024-07-04 VITALS — BP 118/80 | HR 88 | Ht 66.0 in | Wt 151.0 lb

## 2024-07-04 DIAGNOSIS — M5412 Radiculopathy, cervical region: Secondary | ICD-10-CM

## 2024-07-04 MED ORDER — TRIAMCINOLONE ACETONIDE 40 MG/ML IJ SUSP
40.0000 mg | Freq: Once | INTRAMUSCULAR | Status: AC
  Administered 2024-07-04: 40 mg via INTRAMUSCULAR

## 2024-07-04 NOTE — Patient Instructions (Signed)
 It was great to see you!  Keep doing the stretches and meloxicam  I have placed a referral to neurosurgery for evaluation   We are giving you a kenalog injection for your pain   Let's follow-up if symptoms worsen or any concerns  Take care,  Tinnie Harada, NP

## 2024-07-04 NOTE — Progress Notes (Signed)
 Acute Office Visit  Subjective:     Patient ID: Sarah Welch, female    DOB: 12/20/1970, 53 y.o.   MRN: [id number]  Chief Complaint  Patient presents with   Neck Pain    Pain radiating to shoulders    HPI Discussed the use of AI scribe software for clinical note transcription with the patient, who gave verbal consent to proceed.  History of Present Illness   Sarah Welch is a 53 year old female with moderate arthritis, degeneration, and stenosis who presents with neck pain and tingling in the arms.  She experiences neck pain that began upon waking and worsens upon standing, occurring for the second time this month. The pain starts at the base of her skull and extends down her neck, with tingling and heaviness in her arms. Prolonged sitting exacerbates the pain, while daily stretching provides some relief. Her husband's massages offer temporary relief despite initial discomfort.  An MRI on October 5th showed moderate arthritis, degeneration, and stenosis. She has not participated in physical therapy. Lidocaine patches provide minimal relief, and meloxicam offers slight improvement. She previously took duloxetine  but stopped 3-4 months ago, noting increased energy since discontinuation. Walking for extended periods does not trigger pain, but prolonged sitting does. Duloxetine  is available at home but not currently used.     ROS See pertinent positives and negatives per HPI.     Objective:    BP 118/80 (BP Location: Left Arm, Patient Position: Sitting, Cuff Size: Normal)   Pulse 88   Ht 5' 6 (1.676 m)   Wt 151 lb (68.5 kg)   LMP 01/14/2016 (Exact Date)   SpO2 99%   BMI 24.37 kg/m    Physical Exam Vitals and nursing note reviewed.  Constitutional:      General: She is not in acute distress.    Appearance: Normal appearance.  HENT:     Head: Normocephalic.  Eyes:     Conjunctiva/sclera: Conjunctivae normal.  Pulmonary:     Effort: Pulmonary effort is  normal.  Musculoskeletal:        General: Tenderness (base of lateral side of neck) present. No swelling. Normal range of motion.     Cervical back: Normal range of motion.  Skin:    General: Skin is warm.  Neurological:     General: No focal deficit present.     Mental Status: She is alert and oriented to person, place, and time.  Psychiatric:        Mood and Affect: Mood normal.        Behavior: Behavior normal.        Thought Content: Thought content normal.        Judgment: Judgment normal.       Assessment & Plan:   Problem List Items Addressed This Visit       Nervous and Auditory   Cervical radiculopathy - Primary   Chronic, not controlled. Chronic cervical spondylosis with radiculopathy presents with neck pain radiating to shoulders and arms, tingling, and heaviness. MRI indicates moderate arthritis, degeneration, and stenosis. Current management offers limited relief. Continue meloxicam 15mg  daily as needed for pain and daily neck stretches. Refer to neurosurgery for evaluation and potential injections. Discuss the benefits of physical therapy, including dry needling, referral placed. Administer Kenalog 40 mg IM injection for acute symptom relief. Consider resuming duloxetine  if symptoms persist and are uncontrolled by current management.       Relevant Orders   Ambulatory referral to Neurosurgery  Ambulatory referral to Physical Therapy    Meds ordered this encounter  Medications   triamcinolone acetonide (KENALOG-40) injection 40 mg    Return if symptoms worsen or fail to improve.  Tinnie DELENA Harada, NP

## 2024-07-06 NOTE — Assessment & Plan Note (Addendum)
 Chronic, not controlled. Chronic cervical spondylosis with radiculopathy presents with neck pain radiating to shoulders and arms, tingling, and heaviness. MRI indicates moderate arthritis, degeneration, and stenosis. Current management offers limited relief. Continue meloxicam 15mg  daily as needed for pain and daily neck stretches. Refer to neurosurgery for evaluation and potential injections. Discuss the benefits of physical therapy, including dry needling, referral placed. Administer Kenalog 40 mg IM injection for acute symptom relief. Consider resuming duloxetine  if symptoms persist and are uncontrolled by current management.

## 2024-07-08 ENCOUNTER — Other Ambulatory Visit: Payer: Self-pay | Admitting: Nurse Practitioner

## 2024-07-08 ENCOUNTER — Ambulatory Visit
Admission: RE | Admit: 2024-07-08 | Discharge: 2024-07-08 | Disposition: A | Discharge status: Home / Self Care | Source: Ambulatory Visit | Attending: Nurse Practitioner | Admitting: Nurse Practitioner

## 2024-07-08 DIAGNOSIS — R7989 Other specified abnormal findings of blood chemistry: Secondary | ICD-10-CM

## 2024-07-08 DIAGNOSIS — M542 Cervicalgia: Secondary | ICD-10-CM

## 2024-07-08 DIAGNOSIS — Z1231 Encounter for screening mammogram for malignant neoplasm of breast: Secondary | ICD-10-CM

## 2024-07-09 NOTE — Progress Notes (Unsigned)
 [name] [address] [place] [address] Phone: [phone] Subjective:   Sarah Welch, am serving as a scribe for Dr. Arthea Claudene.  I'm seeing this patient by the request  of:  McElwee, Lauren A, NP  CC: Neck and shoulder pain  YEP:Dlagzrupcz  Sarah Welch is a 53 y.o. female coming in with complaint of neck and shoulder pain for past year.  Past medical history is significant for Parkinson's disease.  Patient states that her pain is chronic. Pain radiates into scapula. Pain occurs more often than not. Heating pad helps as well as massage. Tried meloxicam which was not helpful. Had steroid injection in backside last week that helped the neck for 2 days.      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, 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: [employer]    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  Tobacco Use   Smoking status: Former    Current packs/day: 0.00    Average packs/day: 0.5 packs/day for 25.0 years (12.5 ttl pk-yrs)    Types: Cigarettes    Start date: 07/05/1991    Quit date: 07/04/2016    Years since quitting: 8.0   Smokeless tobacco: Never  Vaping Use   Vaping status: Never Used  Substance and Sexual Activity   Alcohol use: No    Alcohol/week: 0.0 standard drinks of alcohol   Drug use: No   Sexual activity: Yes    Partners: Male    Birth control/protection:  Post-menopausal    Comment: first intercourse >15, less than 5 partners  Other Topics Concern   Not on file  Social History Narrative   Right handed   Lives with husband on a 2 level home   Caffeine 1 cup day   Social Drivers of Corporate investment banker Strain: Low Risk  (04/10/2024)   Overall Financial Resource Strain (CARDIA)    Difficulty of Paying Living Expenses: Not hard at all  Food Insecurity: No Food Insecurity (04/10/2024)   Hunger Vital Sign    Worried About Running Out of Food in the Last Year: Never true    Ran Out of Food in the Last Year: Never true  Transportation Needs: No Transportation Needs (04/10/2024)   PRAPARE - Administrator, Civil Service (Medical): No    Lack of Transportation (Non-Medical): No  Physical Activity: Sufficiently Active (04/10/2024)   Exercise Vital Sign    Days of Exercise per Week: 3 days    Minutes of Exercise per Session: 60 min  Stress: Stress Concern Present (04/10/2024)   [employer] of Occupational Health - Occupational Stress Questionnaire    Feeling of Stress: To some extent  Social Connections: Moderately Isolated (04/10/2024)   Social Connection and Isolation Panel    Frequency of Communication with Friends and Family: More  than three times a week    Frequency of Social Gatherings with Friends and Family: Once a week    Attends Religious Services: Never    Database administrator or Organizations: No    Attends Engineer, structural: Not on file    Marital Status: Married   Allergies  Allergen Reactions   Ciprofloxacin Other (See Comments)    SEVERE JOINT PAIN Other reaction(s): Unknown   Pramipexole Other (See Comments)    Could not get up for 24 hours   Oxycodone-Acetaminophen     Other reaction(s): Unknown   Family History  Problem Relation Age of Onset   Heart disease Mother    Alcohol abuse Father    Healthy Sister    Diabetes Maternal Grandmother    Hypertension Maternal Grandmother     Diabetes Maternal Grandfather    Hypertension Maternal Grandfather    Stroke Paternal Grandmother    Heart attack Paternal Grandfather    Healthy Daughter        Current Outpatient Medications (Analgesics):    meloxicam (MOBIC) 15 MG tablet, Take 1 tablet (15 mg total) by mouth daily.   Current Outpatient Medications (Other):    amantadine  (SYMMETREL ) 100 MG capsule, Take 1 capsule (100 mg total) by mouth 3 (three) times daily.   carbidopa -levodopa  (SINEMET  CR) 50-200 MG tablet, Take 1 tablet by mouth at bedtime.   carbidopa -levodopa  (SINEMET  CR) 50-200 MG tablet, Take 1 tablet by mouth at bedtime.   carbidopa -levodopa  (SINEMET  IR) 25-100 MG tablet, Take 2 tablets by mouth 4 (four) times daily.   diazepam  (VALIUM ) 5 MG tablet, TAKE ONE TO ONE AND A HALF TABLETS BY MOUTH TWICE DAILY AS NEEDED   entacapone  (COMTAN ) 200 MG tablet, Take 1 tablet (200 mg total) by mouth 3 (three) times daily. Take with first 3 dosages of levodopa    Estradiol  (VAGIFEM ) 10 MCG TABS vaginal tablet, Place 1 tablet (10 mcg total) vaginally 2 (two) times a week. Place one tablet in the vagina at bedtime twice a week.   lidocaine (LIDODERM) 5 %, Place 1 patch onto the skin daily. Remove & Discard patch within 12 hours or as directed by MD   tiZANidine  (ZANAFLEX ) 4 MG tablet, Take 1 tablet (4 mg total) by mouth every 8 (eight) hours as needed for muscle spasms.   traZODone  (DESYREL ) 50 MG tablet, Take 1-2 tablets (50-100 mg total) by mouth at bedtime.   Vitamin D , Ergocalciferol , (DRISDOL ) 1.25 MG (50000 UNIT) CAPS capsule, Take 1 capsule (50,000 Units total) by mouth every 7 (seven) days.   Reviewed prior external information including notes and imaging from  primary care provider As well as notes that were available from care everywhere and other healthcare systems.  Past medical history, social, surgical and family history all reviewed in electronic medical record.  No pertanent information unless stated  regarding to the chief complaint.   Review of Systems:  No headache, visual changes, nausea, vomiting, diarrhea, constipation, dizziness, abdominal pain, skin rash, fevers, chills, night sweats, weight loss, swollen lymph nodes, body aches, joint swelling, chest pain, shortness of breath, mood changes. POSITIVE muscle aches  Objective  Blood pressure 112/82, pulse 70, height 5' 6 (1.676 m), weight 150 lb (68 kg), last menstrual period 01/14/2016, SpO2 98%.   General: No apparent distress alert and oriented x3 mood and affect normal, dressed appropriately.  HEENT: Pupils equal, extraocular movements intact  Respiratory: Patient's speak in full sentences and does not appear short of breath  Cardiovascular:  No lower extremity edema, non tender, no erythema  Neck exam shows patient does have some tightness noted.  Some tenderness to palpation in the paraspinal musculature.  Negative Spurling's noted today.  Patient does have rigidity though of multiple different areas and does have some tardive dyskinesia of the lower extremities. Shoulder exam shows relatively good range of motion except for some hypertonicity noted of the musculature  [id number]; 15 additional minutes spent for Therapeutic exercises as stated in above notes.  This included exercises focusing on stretching, strengthening, with significant focus on eccentric aspects.   Long term goals include an improvement in range of motion, strength, endurance as well as avoiding reinjury. Patient's frequency would include in 1-2 times a day, 3-5 times a week for a duration of 6-12 weeks.  Shoulder Exercises that included:  Basic scapular stabilization to include adduction and depression of scapula Scaption, focusing on proper movement and good control Internal and External rotation utilizing a theraband, with elbow tucked at side entire time Rows with theraband  Proper technique shown and discussed handout in great detail with ATC.  All questions  were discussed and answered.    Osteopathic findings C2 flexed rotated and side bent right C4 flexed rotated and side bent left C6 flexed rotated and side bent left T3 extended rotated and side bent right inhaled third rib T5 extended rotated and side bent left    Impression and Recommendations:    Cervical radiculopathy Unfortunately a lot of it seems to be muscular in nature but MRI does show that there is actually a potential for a nerve impingement.  Attempted osteopathic manipulation with muscle energy techniques today.  In addition of this we discussed different over-the-counter medications that could be beneficial.  Patient is on multiple medications and is concerned of interactions with her medications for Parkinson's.  Having some difficulty with this anyhow.  Toradol and Depo-Medrol  injections given today to try to help as well.  Given home exercises to work on strengthening of the upper back.  I do feel that the Parkinson's is playing a role with muscle fatigue.  Follow-up with me again 6 to 8 weeks otherwise.    Decision today to treat with OMT was based on Physical Exam  After verbal consent patient was treated with HVLA, ME, FPR techniques in cervical, thoracic, rib areas, all areas are chronic   Patient tolerated the procedure well with improvement in symptoms  Patient given exercises, stretches and lifestyle modifications  See medications in patient instructions if given  Patient will follow up in 4-8 weeks  The above documentation has been reviewed and is accurate and complete Shareta Fishbaugh M Luretta Everly, DO

## 2024-07-11 ENCOUNTER — Ambulatory Visit: Admitting: Family Medicine

## 2024-07-11 ENCOUNTER — Encounter: Payer: Self-pay | Admitting: Family Medicine

## 2024-07-11 ENCOUNTER — Ambulatory Visit: Payer: Self-pay | Admitting: Nurse Practitioner

## 2024-07-11 VITALS — BP 112/82 | HR 70 | Ht 66.0 in | Wt 150.0 lb

## 2024-07-11 DIAGNOSIS — M9908 Segmental and somatic dysfunction of rib cage: Secondary | ICD-10-CM | POA: Diagnosis not present

## 2024-07-11 DIAGNOSIS — M5412 Radiculopathy, cervical region: Secondary | ICD-10-CM | POA: Diagnosis not present

## 2024-07-11 DIAGNOSIS — M9901 Segmental and somatic dysfunction of cervical region: Secondary | ICD-10-CM

## 2024-07-11 DIAGNOSIS — M9902 Segmental and somatic dysfunction of thoracic region: Secondary | ICD-10-CM

## 2024-07-11 DIAGNOSIS — G20A1 Parkinson's disease without dyskinesia, without mention of fluctuations: Secondary | ICD-10-CM | POA: Diagnosis not present

## 2024-07-11 MED ORDER — METHYLPREDNISOLONE ACETATE 80 MG/ML IJ SUSP
80.0000 mg | Freq: Once | INTRAMUSCULAR | Status: AC
  Administered 2024-07-11: 80 mg via INTRAMUSCULAR

## 2024-07-11 MED ORDER — KETOROLAC TROMETHAMINE 60 MG/2ML IM SOLN
60.0000 mg | Freq: Once | INTRAMUSCULAR | Status: AC
  Administered 2024-07-11: 60 mg via INTRAMUSCULAR

## 2024-07-11 NOTE — Patient Instructions (Signed)
 Scapula exercises Injections in backside Epidural  See me in 2 months

## 2024-07-11 NOTE — Assessment & Plan Note (Signed)
 Unfortunately a lot of it seems to be muscular in nature but MRI does show that there is actually a potential for a nerve impingement.  Attempted osteopathic manipulation with muscle energy techniques today.  In addition of this we discussed different over-the-counter medications that could be beneficial.  Patient is on multiple medications and is concerned of interactions with her medications for Parkinson's.  Having some difficulty with this anyhow.  Toradol and Depo-Medrol  injections given today to try to help as well.  Given home exercises to work on strengthening of the upper back.  I do feel that the Parkinson's is playing a role with muscle fatigue.  Follow-up with me again 6 to 8 weeks otherwise.

## 2024-07-30 ENCOUNTER — Ambulatory Visit

## 2024-08-05 NOTE — Progress Notes (Unsigned)
 Assessment/Plan:  Assessment and Plan Assessment & Plan Parkinson's disease with motor fluctuations Significant motor fluctuations with wearing off symptoms 45 minutes before next dose, including weakness, shortness of breath, and jerking movements. Current medication regimen inadequate. Considering vyalev pump for continuous levodopa  delivery. Considering DBS surgery, but prefers pump first. 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  Pt and I discussed Vyalev.  Discussed r/b/se.  Patient has motor fluctuations, including dyskinesia and bradykinesia and has at least 2.5 hours of off time per day.  Patient could certainly benefit from 24 hour/day infusion therapy.  - Assign nurse for pump training and initial infusion in office. - Instructed to use Inbrija  as needed for wearing off symptoms. - Advised to adjust carbidopa -levodopa  dosing to one tablet eight times a day. - Discussed potential for DBS surgery if pump is ineffective. -for now, continue carbidopa /levodopa  25/100, 2 po qid, but can adjust to 1 po 8-9 times per day if better - for now entacapone , 200 mg with first 3 dosages of levodopa . -Continue carbidopa /levodopa  50/200 at bedtime.  -continue amantadine100 mg tid.  She has some livedo reticularis but would rather deal with that than they dyskinesia.  She tried to decrease it but didn't like the excess dyskinesia.      2.  Restless leg             -Continue carbidopa /levodopa  50/200 CR at bedtime  3.  Generalized anxiety disorder associated with Parkinson's disease -Anxiety and depressive symptoms exacerbated by Parkinson's. Acknowledged need for counseling. - Sent referral to [facility] for counseling services. - Encouraged discussion with trusted individuals for counselor recommendations. - Advised to engage in 150 minutes of cardiovascular exercise per week.    Subjective:  Discussed the use of AI scribe software for clinical note transcription with the  patient, who gave verbal consent to proceed.  History of Present Illness Sarah Welch is a 53 year old female with Parkinson's disease who presents with wearing off symptoms.  She experiences significant 'wearing off' symptoms approximately 45 minutes before her next dose of medication, including weakness, shortness of breath, and jerking movements. These symptoms have been present for about a month and can last up to two hours even after taking the next dose. She spends approximately 8 out of 16 waking hours in an 'off' state.  She is currently taking carbidopa -levodopa , two tablets four times a day, with an additional CR dose at bedtime. She has not been using Inbrija , the carbidopa -levodopa  inhaler, as she forgot she had it and did not notice a significant difference when she used it previously.  She has a history of neck pain for which she has been receiving osteopathic manipulative therapy, which has improved her symptoms significantly. She also experiences stiffness and shoulder pain, which she manages with walking and exercises using a walking stick.  She has not seen her psychiatrist since February of the previous year, as she felt her medication was working and did not see the need for further visits. However, she reports feeling exhausted and emotionally affected by the progression of her Parkinson's disease, and has discussed the potential benefit of counseling to help manage anxiety and depression.  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   Current prescribed movement disorder medications:  Carbidopa /levodopa  25/100, 2 po qid (6:30am/10am/1pm/4pm) Carbidopa /levodopa  50/200 q hs  Amantadine  100 mg 3 times per day  Entacapone  200 mg, 1  with first 3 dosages of levodopa   Inbrija  (rarely used)  PREVIOUS MEDICATIONS:  citalopram , Lexapro ; Wellbutrin ; azilect (no help);  pramipexole (swelling, nausea); neupro  (states too expensive); ropinrole (not sure it helped but only at starting dose when she d/c); inbrija  (only used 1 time - she states may have helped - no SE)  ALLERGIES:   Allergies  Allergen Reactions   Ciprofloxacin Other (See Comments)    SEVERE JOINT PAIN Other reaction(s): Unknown   Pramipexole Other (See Comments)    Could not get up for 24 hours   Oxycodone-Acetaminophen     Other reaction(s): Unknown    CURRENT MEDICATIONS:  Current Meds  Medication Sig   amantadine  (SYMMETREL ) 100 MG capsule Take 1 capsule (100 mg total) by mouth 3 (three) times daily.   carbidopa -levodopa  (SINEMET  CR) 50-200 MG tablet Take 1 tablet by mouth at bedtime.   carbidopa -levodopa  (SINEMET  CR) 50-200 MG tablet Take 1 tablet by mouth at bedtime.   carbidopa -levodopa  (SINEMET  IR) 25-100 MG tablet Take 2 tablets by mouth 4 (four) times daily.   diazepam  (VALIUM ) 5 MG tablet TAKE ONE TO ONE AND A HALF TABLETS BY MOUTH TWICE DAILY AS NEEDED   entacapone  (COMTAN ) 200 MG tablet Take 1 tablet (200 mg total) by mouth 3 (three) times daily. Take with first 3 dosages of levodopa  (Patient taking differently: Take 200 mg by mouth in the morning and at bedtime. Take with first 3 dosages of levodopa )   Estradiol  (VAGIFEM ) 10 MCG TABS vaginal tablet Place 1 tablet (10 mcg total) vaginally 2 (two) times a week. Place one tablet in the vagina at bedtime twice a week.     Objective:   PHYSICAL EXAMINATION:    VITALS:   Vitals:   08/08/24 0826  BP: (!) 140/86  Pulse: 84  SpO2: 100%  Weight: 145 lb (65.8 kg)  Height: 5' 5 (1.651 m)     Wt Readings from Last 3 Encounters:  08/08/24 145 lb (65.8 kg)  07/11/24 150 lb (68 kg)  07/04/24 151 lb (68.5 kg)    GEN:  The patient appears stated age and is in NAD.  Anxious at times HEENT:  Normocephalic, atraumatic.  The mucous membranes are moist. The superficial temporal arteries are without ropiness or tenderness. CV:   RRR Lungs:  CTAb  Neurological examination:  Orientation: The patient is alert and oriented x3. Cranial nerves: There is good facial symmetry without facial hypomimia. The speech is fluent and clear. Soft palate rises symmetrically and there is no tongue deviation. Hearing is intact to conversational tone. Sensation: Sensation is intact to light touch throughout Motor: Strength is at least antigravity x4.  Due for medication and took it at end of visit  Movement examination: Tone: There is mild increased in the LUE Abnormal movements: there is mild LUE rest tremor Coordination:  There is mod to severe decremation with finger taps on the L and hand opening and closing on the L Gait and Station: The patient has no difficulty arising out of a deep-seated chair without the use of the hands. The patient's stride length is good with decreased arm swing on the L  I have reviewed and interpreted the following labs independently    Chemistry      Component Value Date/Time   NA 137 04/10/2024 1626   NA 141 08/14/2019 0828   K 4.1 04/10/2024 1626   CL 101 04/10/2024 1626   CO2 29 04/10/2024 1626   BUN 15 04/10/2024 1626   BUN  13 08/14/2019 0828   CREATININE 0.88 04/10/2024 1626   CREATININE 0.77 12/28/2020 1406      Component Value Date/Time   CALCIUM 9.3 04/10/2024 1626   ALKPHOS 70 04/10/2024 1626   AST 19 04/10/2024 1626   ALT 5 04/10/2024 1626   BILITOT 0.7 04/10/2024 1626   BILITOT 0.6 08/14/2019 0828       Lab Results  Component Value Date   WBC 6.4 04/10/2024   HGB 14.2 04/10/2024   HCT 41.5 04/10/2024   MCV 91.0 04/10/2024   PLT 232.0 04/10/2024    Lab Results  Component Value Date   TSH 0.86 03/07/2023   Total time spent on today's visit was 40 minutes, including both face-to-face time and nonface-to-face time.  Time included that spent on review of records (prior notes available to me/labs/imaging if pertinent), discussing treatment and goals, answering  patient's questions and coordinating care.    Cc:  Nedra Tinnie LABOR, NP

## 2024-08-08 ENCOUNTER — Ambulatory Visit: Admitting: Neurology

## 2024-08-08 ENCOUNTER — Encounter: Payer: Self-pay | Admitting: Neurology

## 2024-08-08 VITALS — BP 140/86 | HR 84 | Ht 65.0 in | Wt 145.0 lb

## 2024-08-08 DIAGNOSIS — G20B2 Parkinson's disease with dyskinesia, with fluctuations: Secondary | ICD-10-CM

## 2024-08-08 DIAGNOSIS — F411 Generalized anxiety disorder: Secondary | ICD-10-CM

## 2024-08-08 NOTE — Patient Instructions (Signed)
  VISIT SUMMARY: Today, we discussed your Parkinson's disease and the wearing off symptoms you have been experiencing. We also talked about your anxiety and depression related to Parkinson's disease and the potential benefits of counseling.  YOUR PLAN: -PARKINSON'S DISEASE WITH MOTOR FLUCTUATIONS: Parkinson's disease is a disorder of the nervous system that affects movement. You are experiencing significant motor fluctuations, including weakness, shortness of breath, and jerking movements before your next dose of medication. We are considering a Vialove pump for continuous delivery of levodopa  to help manage these symptoms. We have started the process for insurance approval for the pump and will assign a nurse to train you and start the infusion in the office. You should use Inbrija  as needed for wearing off symptoms and adjust your carbidopa -levodopa  dosing to one tablet eight times a day. If the pump is not effective, we may consider DBS surgery.  -GENERALIZED ANXIETY DISORDER ASSOCIATED WITH PARKINSON'S DISEASE: Generalized anxiety disorder is a condition characterized by persistent and excessive worry. Your anxiety and depressive symptoms are being exacerbated by your Parkinson's disease. We have sent a referral to [facility] for counseling services and encouraged you to discuss with trusted individuals for counselor recommendations. Additionally, you should engage in 150 minutes of cardiovascular exercise per week to help manage your symptoms.  INSTRUCTIONS: We will follow up with you once the insurance approval for the Vialove pump is obtained. Please schedule an appointment with the [facility] for counseling services. Continue to use Inbrija  as needed and adjust your carbidopa -levodopa  dosing as discussed. Engage in 150 minutes of cardiovascular exercise per week.                      Contains text generated by [employer]bridge.                                  Contains text generated by [employer]bridge.

## 2024-08-13 ENCOUNTER — Other Ambulatory Visit: Payer: Self-pay | Admitting: Nurse Practitioner

## 2024-08-13 DIAGNOSIS — F5105 Insomnia due to other mental disorder: Secondary | ICD-10-CM

## 2024-08-14 NOTE — Telephone Encounter (Signed)
 Refill request for  Trazadone 50 mg LR    04/10/24, #180,   1 Rf LOV  07/04/24 FOV  none scheduled.   Please review and advise.  Thanks. Dm/cma

## 2024-08-28 ENCOUNTER — Telehealth: Payer: Self-pay | Admitting: Neurology

## 2024-08-28 ENCOUNTER — Encounter: Payer: Self-pay | Admitting: Neurology

## 2024-08-28 NOTE — Telephone Encounter (Signed)
 Burnard with [facility] at [phone] called and stated that Pt has been approved for the prescription called Vyalev at not cost to Pt. Burnard stated that they were missing the lock out time for the loading doses. Please call to provide clarification. Thanks

## 2024-09-06 NOTE — Progress Notes (Unsigned)
" °  Sarah Welch [facility] [address] [place] [address] Phone: [phone] Subjective:    I'm seeing this patient by the request  of:  Nedra Tinnie LABOR, NP  CC:   YEP:Dlagzrupcz  Sarah Welch is a 53 y.o. female coming in with complaint of back and neck pain. OMT on 07/11/2024. Patient states   Medications patient has been prescribed:   Taking:         Reviewed prior external information including notes and imaging from previsou exam, outside providers and external EMR if available.   As well as notes that were available from care everywhere and other healthcare systems.  Past medical history, social, surgical and family history all reviewed in electronic medical record.  No pertanent information unless stated regarding to the chief complaint.   Past Medical History:  Diagnosis Date   Abnormal Pap smear of cervix 1990   unsure abnormality, colpo OK--hx of cryotherapy to cervix   Parkinson disease (HCC) 12/15    Allergies[1]   Review of Systems:  No headache, visual changes, nausea, vomiting, diarrhea, constipation, dizziness, abdominal pain, skin rash, fevers, chills, night sweats, weight loss, swollen lymph nodes, body aches, joint swelling, chest pain, shortness of breath, mood changes. POSITIVE muscle aches  Objective  Last menstrual period 01/14/2016.   General: No apparent distress alert and oriented x3 mood and affect normal, dressed appropriately.  HEENT: Pupils equal, extraocular movements intact  Respiratory: Patient's speak in full sentences and does not appear short of breath  Cardiovascular: No lower extremity edema, non tender, no erythema  Gait MSK:  Back   Osteopathic findings  C2 flexed rotated and side bent right C6 flexed rotated and side bent left T3 extended rotated and side bent right inhaled rib T9 extended rotated and side bent left L2 flexed rotated and side bent right Sacrum right on right        Assessment and Plan:  No problem-specific Assessment & Plan notes found for this encounter.    Nonallopathic problems  Decision today to treat with OMT was based on Physical Exam  After verbal consent patient was treated with HVLA, ME, FPR techniques in cervical, rib, thoracic, lumbar, and sacral  areas  Patient tolerated the procedure well with improvement in symptoms  Patient given exercises, stretches and lifestyle modifications  See medications in patient instructions if given  Patient will follow up in 4-8 weeks             Note: This dictation was prepared with Dragon dictation along with smaller phrase technology. Any transcriptional errors that result from this process are unintentional.            [1]  Allergies Allergen Reactions   Ciprofloxacin Other (See Comments)    SEVERE JOINT PAIN Other reaction(s): Unknown   Pramipexole Other (See Comments)    Could not get up for 24 hours   Oxycodone-Acetaminophen     Other reaction(s): Unknown   "

## 2024-09-10 ENCOUNTER — Ambulatory Visit: Admitting: Family Medicine

## 2024-09-10 ENCOUNTER — Encounter: Payer: Self-pay | Admitting: Family Medicine

## 2024-09-10 VITALS — BP 122/86 | HR 87 | Ht 65.0 in | Wt 142.0 lb

## 2024-09-10 DIAGNOSIS — M9904 Segmental and somatic dysfunction of sacral region: Secondary | ICD-10-CM

## 2024-09-10 DIAGNOSIS — G8929 Other chronic pain: Secondary | ICD-10-CM | POA: Diagnosis not present

## 2024-09-10 DIAGNOSIS — M546 Pain in thoracic spine: Secondary | ICD-10-CM

## 2024-09-10 DIAGNOSIS — M5412 Radiculopathy, cervical region: Secondary | ICD-10-CM | POA: Diagnosis not present

## 2024-09-10 DIAGNOSIS — M9908 Segmental and somatic dysfunction of rib cage: Secondary | ICD-10-CM | POA: Diagnosis not present

## 2024-09-10 DIAGNOSIS — M9902 Segmental and somatic dysfunction of thoracic region: Secondary | ICD-10-CM

## 2024-09-10 DIAGNOSIS — G20A1 Parkinson's disease without dyskinesia, without mention of fluctuations: Secondary | ICD-10-CM | POA: Diagnosis not present

## 2024-09-10 DIAGNOSIS — M9903 Segmental and somatic dysfunction of lumbar region: Secondary | ICD-10-CM

## 2024-09-10 DIAGNOSIS — M9901 Segmental and somatic dysfunction of cervical region: Secondary | ICD-10-CM

## 2024-09-10 NOTE — Assessment & Plan Note (Signed)
 Stable with no significant radicular symptoms.  Responding extremely well to osteopathic manipulation in the medications.  No other change in management at this time.  Follow-up again in 6 to 12 weeks

## 2024-09-10 NOTE — Patient Instructions (Signed)
 Good to see you Doing well Enjoy grandkids See me in 2-3 months

## 2024-09-10 NOTE — Assessment & Plan Note (Signed)
 Causing some of the tightness but seems to be doing well.

## 2024-09-10 NOTE — Assessment & Plan Note (Signed)
 Has responded well to osteopathic manipulation.  We will continue to monitor.  Patient states that the exercises have been the most helpful.  Encourage patient to continue to stay active.  Discussed icing regiment.  Follow-up again in 6 to 12 weeks

## 2024-09-15 ENCOUNTER — Other Ambulatory Visit: Payer: Self-pay | Admitting: Neurology

## 2024-09-15 DIAGNOSIS — G20B2 Parkinson's disease with dyskinesia, with fluctuations: Secondary | ICD-10-CM

## 2024-09-30 ENCOUNTER — Ambulatory Visit: Admitting: Nurse Practitioner

## 2024-09-30 ENCOUNTER — Encounter: Payer: Self-pay | Admitting: Nurse Practitioner

## 2024-09-30 DIAGNOSIS — M5412 Radiculopathy, cervical region: Secondary | ICD-10-CM | POA: Diagnosis not present

## 2024-09-30 MED ORDER — METHYLPREDNISOLONE ACETATE 80 MG/ML IJ SUSP
80.0000 mg | Freq: Once | INTRAMUSCULAR | Status: AC
  Administered 2024-09-30: 80 mg via INTRAMUSCULAR

## 2024-09-30 MED ORDER — KETOROLAC TROMETHAMINE 60 MG/2ML IM SOLN
60.0000 mg | Freq: Once | INTRAMUSCULAR | Status: AC
  Administered 2024-09-30: 60 mg via INTRAMUSCULAR

## 2024-09-30 NOTE — Patient Instructions (Signed)
 It was great to see you!  We are giving you 2 injection today for your pain  Do not take ibuprofen , aleve, or meloxicam  for 24 hours   Let's follow-up if symptoms worsen or any concerns   Take care,  Tinnie Harada, NP

## 2024-09-30 NOTE — Progress Notes (Signed)
 "  Acute Office Visit  Subjective:     Patient ID: Sarah Welch, female    DOB: Oct 16, 1970, 54 y.o.   MRN: [id number]  Chief Complaint  Patient presents with   Cervical radiculopathy    Follow up    HPI Discussed the use of AI scribe software for clinical note transcription with the patient, who gave verbal consent to proceed.  History of Present Illness   Sarah Welch is a 54 year old female who presents with acute neck and shoulder pain.  She has had worsening neck and shoulder pain since Friday, with shoulder swelling sensation, limited neck rotation, and radiation from the neck to the shoulder blade without arm pain. She notes arm weakness when lifting objects, without associated arm pain.  Aleve and Tylenol provide partial relief. A heating pad, muscle rubs, and prior muscle relaxers have not helped. The pain disrupts her sleep, waking her around 4 AM and preventing comfortable return to sleep.  She previously had Kenalog  and steroid injections that relieved symptoms for about three and a half months. She is following with sports medicine and receiving osteopathic manipulation every few months. She denies recent illnesses such as sore throat and fever.  Her husband feels knots in her back, but she does not have tenderness to touch in the affected area. Muscle relaxers used this episode have not improved the pain.     ROS See pertinent positives and negatives per HPI.     Objective:    BP 116/82 (BP Location: Left Arm, Patient Position: Sitting, Cuff Size: Normal)   Pulse 84   Temp (!) 97.4 F (36.3 C)   Ht 5' 5 (1.651 m)   Wt 140 lb 9.6 oz (63.8 kg)   LMP 01/14/2016   SpO2 98%   BMI 23.40 kg/m    Physical Exam Vitals and nursing note reviewed.  Constitutional:      General: She is not in acute distress.    Appearance: Normal appearance.  HENT:     Head: Normocephalic.  Eyes:     Conjunctiva/sclera: Conjunctivae normal.  Pulmonary:      Effort: Pulmonary effort is normal.  Musculoskeletal:        General: No swelling or tenderness. Normal range of motion.     Cervical back: Normal range of motion.  Skin:    General: Skin is warm.  Neurological:     General: No focal deficit present.     Mental Status: She is alert and oriented to person, place, and time.  Psychiatric:        Mood and Affect: Mood normal.        Behavior: Behavior normal.        Thought Content: Thought content normal.        Judgment: Judgment normal.      Assessment & Plan:   Problem List Items Addressed This Visit       Nervous and Auditory   Cervical radiculopathy   Chronic with exacerbation. Acute exacerbation with pain from neck to shoulder blade and arm weakness. Previous injections were effective. Administered Toradol  60mg  and Depo-Medrol  80mg  IM injections. Advised against taking ibuprofen , Aleve, or meloxicam  today. Tylenol is allowed for pain management. Continue stretches and exercises.        Meds ordered this encounter  Medications   ketorolac  (TORADOL ) injection 60 mg   methylPREDNISolone  acetate (DEPO-MEDROL ) injection 80 mg    Return if symptoms worsen or fail to improve.  Catalia Massett A Jama Krichbaum,  NP   "

## 2024-09-30 NOTE — Assessment & Plan Note (Signed)
 Chronic with exacerbation. Acute exacerbation with pain from neck to shoulder blade and arm weakness. Previous injections were effective. Administered Toradol  60mg  and Depo-Medrol  80mg  IM injections. Advised against taking ibuprofen , Aleve, or meloxicam  today. Tylenol is allowed for pain management. Continue stretches and exercises.

## 2024-10-01 ENCOUNTER — Ambulatory Visit: Admitting: Nurse Practitioner

## 2024-11-11 ENCOUNTER — Ambulatory Visit: Admitting: Family Medicine

## 2024-12-31 ENCOUNTER — Ambulatory Visit: Admitting: Family Medicine

## 2025-02-06 ENCOUNTER — Ambulatory Visit: Admitting: Neurology
